# Patient Record
Sex: Female | Born: 1935 | Race: White | Hispanic: No | Smoking: Former smoker
Health system: Southern US, Community
[De-identification: ages and names within clinical notes are randomized; demographics above are authoritative.]

## PROBLEM LIST (undated history)

## (undated) DIAGNOSIS — E785 Hyperlipidemia, unspecified: Secondary | ICD-10-CM

## (undated) DIAGNOSIS — Z8619 Personal history of other infectious and parasitic diseases: Secondary | ICD-10-CM

## (undated) DIAGNOSIS — G40909 Epilepsy, unspecified, not intractable, without status epilepticus: Secondary | ICD-10-CM

## (undated) DIAGNOSIS — R011 Cardiac murmur, unspecified: Secondary | ICD-10-CM

## (undated) DIAGNOSIS — I509 Heart failure, unspecified: Secondary | ICD-10-CM

## (undated) DIAGNOSIS — Z9109 Other allergy status, other than to drugs and biological substances: Secondary | ICD-10-CM

## (undated) DIAGNOSIS — I499 Cardiac arrhythmia, unspecified: Secondary | ICD-10-CM

## (undated) DIAGNOSIS — I471 Supraventricular tachycardia: Secondary | ICD-10-CM

## (undated) DIAGNOSIS — I10 Essential (primary) hypertension: Secondary | ICD-10-CM

## (undated) DIAGNOSIS — I739 Peripheral vascular disease, unspecified: Secondary | ICD-10-CM

## (undated) DIAGNOSIS — G459 Transient cerebral ischemic attack, unspecified: Secondary | ICD-10-CM

## (undated) DIAGNOSIS — F0390 Unspecified dementia without behavioral disturbance: Secondary | ICD-10-CM

## (undated) DIAGNOSIS — H9190 Unspecified hearing loss, unspecified ear: Secondary | ICD-10-CM

## (undated) HISTORY — DX: Heart failure, unspecified: I50.9

## (undated) HISTORY — PX: EYE SURGERY: SHX253

## (undated) HISTORY — DX: Supraventricular tachycardia: I47.1

## (undated) HISTORY — DX: Transient cerebral ischemic attack, unspecified: G45.9

## (undated) HISTORY — PX: VAGINAL HYSTERECTOMY: SUR661

## (undated) HISTORY — PX: ABLATION OF DYSRHYTHMIC FOCUS: SHX254

## (undated) HISTORY — DX: Hyperlipidemia, unspecified: E78.5

## (undated) HISTORY — DX: Essential (primary) hypertension: I10

## (undated) HISTORY — PX: JOINT REPLACEMENT: SHX530

## (undated) HISTORY — PX: AUGMENTATION MAMMAPLASTY: SUR837

## (undated) HISTORY — PX: KNEE ARTHROSCOPY W/ MENISCAL REPAIR: SHX1877

## (undated) HISTORY — DX: Unspecified hearing loss, unspecified ear: H91.90

## (undated) HISTORY — DX: Epilepsy, unspecified, not intractable, without status epilepticus: G40.909

## (undated) HISTORY — PX: TONSILLECTOMY: SUR1361

## (undated) HISTORY — DX: Personal history of other infectious and parasitic diseases: Z86.19

## (undated) HISTORY — PX: CORONARY ANGIOPLASTY: SHX604

## (undated) HISTORY — DX: Cardiac arrhythmia, unspecified: I49.9

## (undated) HISTORY — DX: Unspecified dementia, unspecified severity, without behavioral disturbance, psychotic disturbance, mood disturbance, and anxiety: F03.90

## (undated) HISTORY — DX: Cardiac murmur, unspecified: R01.1

---

## 2000-08-07 LAB — HM COLONOSCOPY

## 2012-05-30 ENCOUNTER — Encounter: Payer: Self-pay | Admitting: *Deleted

## 2012-06-07 ENCOUNTER — Ambulatory Visit: Payer: Self-pay | Admitting: Sports Medicine

## 2012-06-14 ENCOUNTER — Ambulatory Visit (INDEPENDENT_AMBULATORY_CARE_PROVIDER_SITE_OTHER): Payer: Medicare PPO | Admitting: Cardiovascular Disease

## 2012-06-14 ENCOUNTER — Encounter: Payer: Self-pay | Admitting: Cardiovascular Disease

## 2012-06-14 VITALS — BP 180/90 | HR 78 | Ht 64.0 in | Wt 140.8 lb

## 2012-06-14 DIAGNOSIS — I10 Essential (primary) hypertension: Secondary | ICD-10-CM | POA: Insufficient documentation

## 2012-06-14 DIAGNOSIS — I498 Other specified cardiac arrhythmias: Secondary | ICD-10-CM

## 2012-06-14 DIAGNOSIS — E785 Hyperlipidemia, unspecified: Secondary | ICD-10-CM | POA: Insufficient documentation

## 2012-06-14 DIAGNOSIS — I4891 Unspecified atrial fibrillation: Secondary | ICD-10-CM | POA: Insufficient documentation

## 2012-06-14 DIAGNOSIS — I471 Supraventricular tachycardia: Secondary | ICD-10-CM

## 2012-06-14 NOTE — Patient Instructions (Addendum)
Labs today.  Continue same medications.  Follow up in 6 months.  

## 2012-06-14 NOTE — Progress Notes (Signed)
HPI  This is a 78 year old female who is here today to establish cardiovascular care. She moved last year from [place], [place] to be close to her family members. She is a retired Engineer, civil (consulting). 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 No recurrent episodes since then. She had cardiac catheterization in November of 2012 which showed no significant coronary artery disease. She also has history of hypertension with possible white coat syndrome. Atrial fibrillation is listed in her chart but she is certain that she never had A. fib before and was never on anticoagulation. She denies any chest pain, dyspnea, palpitations or dizziness. She saw Dr. Lady Gary for one visit but decided to switch.  Allergies  Allergen Reactions  . Penicillins   . Statins      Current Outpatient Prescriptions on File Prior to Visit  Medication Sig Dispense Refill  . aspirin 81 MG tablet Take 81 mg by mouth daily.      Marland Kitchen b complex vitamins tablet Take 1 tablet by mouth daily.      . Calcium Carb-Cholecalciferol (CALCIUM + D3) 600-200 MG-UNIT TABS Take by mouth 2 (two) times daily.      . carvedilol (COREG) 6.25 MG tablet Take 6.25 mg by mouth 2 (two) times daily with a meal.      . Coenzyme Q10 200 MG capsule Take 200 mg by mouth daily.      Marland Kitchen ezetimibe (ZETIA) 10 MG tablet Take 10 mg by mouth daily.      . hydrochlorothiazide (HYDRODIURIL) 25 MG tablet Take 12.5 mg by mouth daily.       . irbesartan (AVAPRO) 150 MG tablet Take 150 mg by mouth daily.      . Omega-3 Fatty Acids (ULTRA OMEGA-3 FISH OIL PO) Take 1,200 mg by mouth daily.          Past Medical History  Diagnosis Date  . Dyslipidemia   . HOH (hard of hearing)   . Hyperlipidemia   . Heart murmur   . H/O scarlet fever   . Hypertension   . 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 Harvie Bridge, [place].  . Ectopic  atrial tachycardia      Past Surgical History  Procedure Date  . Tonsillectomy   . Vaginal hysterectomy      History reviewed. No pertinent family history.   History   Social History  . Marital Status: Divorced    Spouse Name: N/A    Number of Children: N/A  . Years of Education: N/A   Occupational History  . Not on file.   Social History Main Topics  . Smoking status: Former Smoker -- 1.0 packs/day for 10 years    Types: Cigarettes  . Smokeless tobacco: Not on file  . Alcohol Use: No  . Drug Use: No  . Sexually Active:    Other Topics Concern  . Not on file   Social History Narrative  . No narrative on file     ROS Constitutional: Negative for fever, chills, diaphoresis, activity change, appetite change and fatigue.  HENT: Negative for hearing loss, nosebleeds, congestion, sore throat, facial swelling, drooling, trouble swallowing, neck pain, voice change, sinus pressure and tinnitus.  Eyes: Negative for photophobia, pain, discharge and visual disturbance.  Respiratory: Negative for apnea, cough, chest tightness, shortness of breath and wheezing.  Cardiovascular: Negative for chest pain, palpitations and leg  swelling.  Gastrointestinal: Negative for nausea, vomiting, abdominal pain, diarrhea, constipation, blood in stool and abdominal distention.  Genitourinary: Negative for dysuria, urgency, frequency, hematuria and decreased urine volume.  Musculoskeletal: Negative for myalgias, back pain, joint swelling, arthralgias and gait problem.  Skin: Negative for color change, pallor, rash and wound.  Neurological: Negative for dizziness, tremors, seizures, syncope, speech difficulty, weakness, light-headedness, numbness and headaches.  Psychiatric/Behavioral: Negative for suicidal ideas, hallucinations, behavioral problems and agitation. The patient is not nervous/anxious.     PHYSICAL EXAM   BP 180/90  Pulse 78  Ht 5\' 4"  (1.626 m)  Wt 140 lb 12 oz (63.844 kg)   BMI 24.16 kg/m2 Constitutional: She is oriented to person, place, and time. She appears well-developed and well-nourished. No distress.  HENT: No nasal discharge.  Head: Normocephalic and atraumatic.  Eyes: Pupils are equal and round. Right eye exhibits no discharge. Left eye exhibits no discharge.  Neck: Normal range of motion. Neck supple. No JVD present. No thyromegaly present.  Cardiovascular: Normal rate, regular rhythm, normal heart sounds. Exam reveals no gallop and no friction rub. No murmur heard.  Pulmonary/Chest: Effort normal and breath sounds normal. No stridor. No respiratory distress. She has no wheezes. She has no rales. She exhibits no tenderness.  Abdominal: Soft. Bowel sounds are normal. She exhibits no distension. There is no tenderness. There is no rebound and no guarding.  Musculoskeletal: Normal range of motion. She exhibits no edema and no tenderness.  Neurological: She is alert and oriented to person, place, and time. Coordination normal.  Skin: Skin is warm and dry. No rash noted. She is not diaphoretic. No erythema. No pallor.  Psychiatric: She has a normal mood and affect. Her behavior is normal. Judgment and thought content normal.     EKG: Sinus  Rhythm  WITHIN NORMAL LIMITS   ASSESSMENT AND PLAN

## 2012-06-14 NOTE — Assessment & Plan Note (Signed)
No recurrent arrhythmia since the ablation procedure in 2011. Continue treatment with Coreg at current dose. Followup in 6 months.

## 2012-06-14 NOTE — Assessment & Plan Note (Signed)
I will obtain a fasting lipid and liver profile. She also need to establish with a primary care physician.

## 2012-06-14 NOTE — Assessment & Plan Note (Signed)
Her blood pressure is elevated today but she reports normal blood pressure readings at home. She did not tolerate higher dose of hydrochlorothiazide due to dizziness. I asked her to monitor blood pressure and notify us if systolic is above 140.

## 2012-06-15 LAB — HEPATIC FUNCTION PANEL
ALT: 15 IU/L (ref 0–32)
Bilirubin, Direct: 0.2 mg/dL (ref 0.00–0.40)

## 2012-06-15 LAB — LIPID PANEL
Cholesterol, Total: 253 mg/dL — ABNORMAL HIGH (ref 100–199)
Triglycerides: 143 mg/dL (ref 0–149)
VLDL Cholesterol Cal: 29 mg/dL (ref 5–40)

## 2012-06-26 ENCOUNTER — Telehealth: Payer: Self-pay

## 2012-06-26 NOTE — Telephone Encounter (Signed)
Pt scheduled for knee surg 2/27 at [facility] Is she cleared for surg?

## 2012-06-28 NOTE — Telephone Encounter (Signed)
Yes, she is cleared.

## 2012-06-29 ENCOUNTER — Other Ambulatory Visit: Payer: Self-pay

## 2012-07-01 NOTE — Telephone Encounter (Signed)
Letter faxed to [phone]

## 2012-07-03 ENCOUNTER — Other Ambulatory Visit: Payer: Self-pay | Admitting: *Deleted

## 2012-07-03 MED ORDER — IRBESARTAN 150 MG PO TABS: 150.0000 mg | ORAL_TABLET | Freq: Every day | ORAL | Status: DC

## 2012-07-03 MED ORDER — EZETIMIBE 10 MG PO TABS: 10.0000 mg | ORAL_TABLET | Freq: Every day | ORAL | Status: DC

## 2012-07-03 NOTE — Telephone Encounter (Signed)
Needs 90 daily refill called in for both medications to [facility]

## 2012-07-18 DIAGNOSIS — M659 Unspecified synovitis and tenosynovitis, unspecified site: Secondary | ICD-10-CM | POA: Diagnosis not present

## 2012-07-18 DIAGNOSIS — M23359 Other meniscus derangements, posterior horn of lateral meniscus, unspecified knee: Secondary | ICD-10-CM | POA: Diagnosis not present

## 2012-07-18 DIAGNOSIS — R9431 Abnormal electrocardiogram [ECG] [EKG]: Secondary | ICD-10-CM | POA: Diagnosis not present

## 2012-07-18 DIAGNOSIS — R0789 Other chest pain: Secondary | ICD-10-CM | POA: Diagnosis not present

## 2012-07-18 DIAGNOSIS — R079 Chest pain, unspecified: Secondary | ICD-10-CM | POA: Diagnosis not present

## 2012-07-18 DIAGNOSIS — M23349 Other meniscus derangements, anterior horn of lateral meniscus, unspecified knee: Secondary | ICD-10-CM | POA: Diagnosis not present

## 2012-07-18 DIAGNOSIS — M9420 Chondromalacia, unspecified site: Secondary | ICD-10-CM | POA: Diagnosis not present

## 2012-07-18 DIAGNOSIS — S83289A Other tear of lateral meniscus, current injury, unspecified knee, initial encounter: Secondary | ICD-10-CM | POA: Diagnosis not present

## 2012-07-22 ENCOUNTER — Encounter: Payer: Self-pay | Admitting: Cardiovascular Disease

## 2012-07-22 ENCOUNTER — Ambulatory Visit (INDEPENDENT_AMBULATORY_CARE_PROVIDER_SITE_OTHER): Payer: Medicare Other | Admitting: Cardiovascular Disease

## 2012-07-22 VITALS — BP 160/70 | HR 76 | Ht 65.0 in | Wt 141.8 lb

## 2012-07-22 DIAGNOSIS — R079 Chest pain, unspecified: Secondary | ICD-10-CM | POA: Diagnosis not present

## 2012-07-22 DIAGNOSIS — I10 Essential (primary) hypertension: Secondary | ICD-10-CM

## 2012-07-22 DIAGNOSIS — E785 Hyperlipidemia, unspecified: Secondary | ICD-10-CM

## 2012-07-22 DIAGNOSIS — I498 Other specified cardiac arrhythmias: Secondary | ICD-10-CM

## 2012-07-22 DIAGNOSIS — I471 Supraventricular tachycardia: Secondary | ICD-10-CM

## 2012-07-22 MED ORDER — CARVEDILOL 12.5 MG PO TABS: 12.5000 mg | ORAL_TABLET | Freq: Two times a day (BID) | ORAL | Status: DC

## 2012-07-22 MED ORDER — ROSUVASTATIN CALCIUM 5 MG PO TABS: 5.0000 mg | ORAL_TABLET | Freq: Every day | ORAL | Status: DC

## 2012-07-22 NOTE — Progress Notes (Signed)
HPI  This is a 78 year old female who is here today for a followup visit regarding recent chest pain after knee surgery. 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 No recurrent episodes since then. She had cardiac catheterization in November of 2012 which showed no significant coronary artery disease. She also has history of hypertension with possible white coat syndrome. Atrial fibrillation is listed in her chart but she is certain that she never had A. fib before and was never on anticoagulation. She underwent right knee arthroscopic surgery last week in [place] without complications. In the recovery area, she started having substernal chest tightness which lasted for 30 minutes. It improved with nitroglycerin and fentanyl. She had an EKG done which was read as atrial fibrillation. However, looking closely at the EKG, P waves were evident. The rhythm was sinus with sinus arrhythmia. Heart rate was 66 beats per minute. No significant ST changes. She was sent to the emergency room at [facility]. Cardiac enzymes were negative. No chest discomfort since then. Her blood pressure is still running high.  Allergies  Allergen Reactions  . Penicillins   . Statins      Current Outpatient Prescriptions on File Prior to Visit  Medication Sig Dispense Refill  . aspirin 81 MG tablet Take 81 mg by mouth daily.      Marland Kitchen b complex vitamins tablet Take 1 tablet by mouth daily.      . Calcium Carb-Cholecalciferol (CALCIUM + D3) 600-200 MG-UNIT TABS Take by mouth 2 (two) times daily.      . Coenzyme Q10 200 MG capsule Take 200 mg by mouth daily.      Marland Kitchen ezetimibe (ZETIA) 10 MG tablet Take 1 tablet (10 mg total) by mouth daily.  90 tablet  3  . hydrochlorothiazide (HYDRODIURIL) 25 MG tablet Take 12.5 mg by mouth daily.       . irbesartan (AVAPRO) 150 MG tablet Take 1 tablet (150 mg total) by mouth daily.  90  tablet  3  . Omega-3 Fatty Acids (ULTRA OMEGA-3 FISH OIL PO) Take 1,200 mg by mouth daily.        No current facility-administered medications on file prior to visit.     Past Medical History  Diagnosis Date  . Dyslipidemia   . HOH (hard of hearing)   . Heart murmur   . H/O scarlet fever   . Hypertension   . 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 Harvie Bridge, [place].  . Ectopic atrial tachycardia   . Hyperlipidemia      Past Surgical History  Procedure Laterality Date  . Tonsillectomy    . Vaginal hysterectomy    . Knee arthroscopy w/ meniscal repair      right     History reviewed. No pertinent family history.   History   Social History  . Marital Status: Divorced    Spouse Name: N/A    Number of Children: N/A  . Years of Education: N/A   Occupational History  . Not on file.   Social History Main Topics  . Smoking status: Former Smoker -- 1.00 packs/day for 10 years    Types: Cigarettes  . Smokeless tobacco: Not on file  . Alcohol Use: No  . Drug Use: No  . Sexually Active: Not on file   Other Topics Concern  . Not  on file   Social History Narrative  . No narrative on file      PHYSICAL EXAM   BP 160/70  Pulse 76  Ht 5\' 5"  (1.651 m)  Wt 141 lb 12 oz (64.297 kg)  BMI 23.59 kg/m2 Constitutional: She is oriented to person, place, and time. She appears well-developed and well-nourished. No distress.  HENT: No nasal discharge.  Head: Normocephalic and atraumatic.  Eyes: Pupils are equal and round. Right eye exhibits no discharge. Left eye exhibits no discharge.  Neck: Normal range of motion. Neck supple. No JVD present. No thyromegaly present.  Cardiovascular: Normal rate, regular rhythm, normal heart sounds. Exam reveals no gallop and no friction rub. No murmur heard.  Pulmonary/Chest: Effort normal and breath sounds normal. No stridor. No respiratory distress. She has no wheezes. She  has no rales. She exhibits no tenderness.  Abdominal: Soft. Bowel sounds are normal. She exhibits no distension. There is no tenderness. There is no rebound and no guarding.  Musculoskeletal: Normal range of motion. She exhibits no edema and no tenderness.  Neurological: She is alert and oriented to person, place, and time. Coordination normal.  Skin: Skin is warm and dry. No rash noted. She is not diaphoretic. No erythema. No pallor.  Psychiatric: She has a normal mood and affect. Her behavior is normal. Judgment and thought content normal.     EKG: Sinus  Rhythm  WITHIN NORMAL LIMITS   ASSESSMENT AND PLAN

## 2012-07-22 NOTE — Patient Instructions (Addendum)
Increase Carvedilol to 12.5 mg twice daily.  Start Crestor 5 mg daily for high cholesterol. Follow up in 1 month.

## 2012-07-22 NOTE — Assessment & Plan Note (Signed)
She had one episode of his chest pain after recent knee surgery. No episodes since then. ECG does not show any acute ischemic changes. Cardiac enzymes were negative. She had cardiac catheterization in November of 2012 which showed no significant coronary artery disease. Based on all of that, the chance of underlying obstructive coronary artery disease is low. Continue to monitor for now. If she develops any recurrent chest pain, the plan is to proceed with a pharmacologic nuclear stress test.

## 2012-07-22 NOTE — Assessment & Plan Note (Signed)
Her blood pressure is still elevated. I will increase the dose of carvedilol to 12.5 mg twice daily.

## 2012-07-22 NOTE — Assessment & Plan Note (Signed)
Lab Results  Component Value Date   HDL 59 06/14/2012   LDLCALC 165* 06/14/2012   TRIG 143 06/14/2012   CHOLHDL 4.3 06/14/2012   She has tried only on statin in the past. I am concerned about the degree of elevation in LDL. Thus, I will start her on small dose rosuvastatin 5 mg once daily. She will need a followup lipid and liver profile in 6 weeks.

## 2012-07-22 NOTE — Assessment & Plan Note (Signed)
No recent arrhythmia. Recent ECG was read as atrial fibrillation. However, it was sinus rhythm with sinus arrhythmia.

## 2012-08-07 DIAGNOSIS — IMO0002 Reserved for concepts with insufficient information to code with codable children: Secondary | ICD-10-CM | POA: Diagnosis not present

## 2012-08-07 DIAGNOSIS — M25569 Pain in unspecified knee: Secondary | ICD-10-CM | POA: Diagnosis not present

## 2012-08-07 DIAGNOSIS — M6281 Muscle weakness (generalized): Secondary | ICD-10-CM | POA: Diagnosis not present

## 2012-08-07 DIAGNOSIS — M1710 Unilateral primary osteoarthritis, unspecified knee: Secondary | ICD-10-CM | POA: Diagnosis not present

## 2012-08-09 DIAGNOSIS — M1710 Unilateral primary osteoarthritis, unspecified knee: Secondary | ICD-10-CM | POA: Diagnosis not present

## 2012-08-09 DIAGNOSIS — IMO0002 Reserved for concepts with insufficient information to code with codable children: Secondary | ICD-10-CM | POA: Diagnosis not present

## 2012-08-09 DIAGNOSIS — M6281 Muscle weakness (generalized): Secondary | ICD-10-CM | POA: Diagnosis not present

## 2012-08-09 DIAGNOSIS — M25569 Pain in unspecified knee: Secondary | ICD-10-CM | POA: Diagnosis not present

## 2012-08-09 DIAGNOSIS — M25669 Stiffness of unspecified knee, not elsewhere classified: Secondary | ICD-10-CM | POA: Diagnosis not present

## 2012-08-12 DIAGNOSIS — M6281 Muscle weakness (generalized): Secondary | ICD-10-CM | POA: Diagnosis not present

## 2012-08-12 DIAGNOSIS — M25669 Stiffness of unspecified knee, not elsewhere classified: Secondary | ICD-10-CM | POA: Diagnosis not present

## 2012-08-12 DIAGNOSIS — M25569 Pain in unspecified knee: Secondary | ICD-10-CM | POA: Diagnosis not present

## 2012-08-12 DIAGNOSIS — IMO0002 Reserved for concepts with insufficient information to code with codable children: Secondary | ICD-10-CM | POA: Diagnosis not present

## 2012-08-12 DIAGNOSIS — M1710 Unilateral primary osteoarthritis, unspecified knee: Secondary | ICD-10-CM | POA: Diagnosis not present

## 2012-08-16 DIAGNOSIS — M6281 Muscle weakness (generalized): Secondary | ICD-10-CM | POA: Diagnosis not present

## 2012-08-16 DIAGNOSIS — M25569 Pain in unspecified knee: Secondary | ICD-10-CM | POA: Diagnosis not present

## 2012-08-16 DIAGNOSIS — M25669 Stiffness of unspecified knee, not elsewhere classified: Secondary | ICD-10-CM | POA: Diagnosis not present

## 2012-08-16 DIAGNOSIS — M1710 Unilateral primary osteoarthritis, unspecified knee: Secondary | ICD-10-CM | POA: Diagnosis not present

## 2012-08-16 DIAGNOSIS — IMO0002 Reserved for concepts with insufficient information to code with codable children: Secondary | ICD-10-CM | POA: Diagnosis not present

## 2012-08-20 DIAGNOSIS — M25569 Pain in unspecified knee: Secondary | ICD-10-CM | POA: Diagnosis not present

## 2012-08-20 DIAGNOSIS — M1710 Unilateral primary osteoarthritis, unspecified knee: Secondary | ICD-10-CM | POA: Diagnosis not present

## 2012-08-20 DIAGNOSIS — IMO0002 Reserved for concepts with insufficient information to code with codable children: Secondary | ICD-10-CM | POA: Diagnosis not present

## 2012-08-20 DIAGNOSIS — M25669 Stiffness of unspecified knee, not elsewhere classified: Secondary | ICD-10-CM | POA: Diagnosis not present

## 2012-08-20 DIAGNOSIS — M6281 Muscle weakness (generalized): Secondary | ICD-10-CM | POA: Diagnosis not present

## 2012-08-23 ENCOUNTER — Ambulatory Visit: Payer: Medicare Other | Admitting: Cardiovascular Disease

## 2012-08-23 DIAGNOSIS — M25569 Pain in unspecified knee: Secondary | ICD-10-CM | POA: Diagnosis not present

## 2012-08-23 DIAGNOSIS — IMO0002 Reserved for concepts with insufficient information to code with codable children: Secondary | ICD-10-CM | POA: Diagnosis not present

## 2012-08-23 DIAGNOSIS — M1710 Unilateral primary osteoarthritis, unspecified knee: Secondary | ICD-10-CM | POA: Diagnosis not present

## 2012-08-23 DIAGNOSIS — M6281 Muscle weakness (generalized): Secondary | ICD-10-CM | POA: Diagnosis not present

## 2012-08-23 DIAGNOSIS — M25669 Stiffness of unspecified knee, not elsewhere classified: Secondary | ICD-10-CM | POA: Diagnosis not present

## 2012-09-12 ENCOUNTER — Ambulatory Visit (INDEPENDENT_AMBULATORY_CARE_PROVIDER_SITE_OTHER): Payer: Medicare Other | Admitting: Cardiovascular Disease

## 2012-09-12 ENCOUNTER — Encounter: Payer: Self-pay | Admitting: Cardiovascular Disease

## 2012-09-12 VITALS — BP 171/69 | HR 83 | Ht 65.0 in | Wt 140.8 lb

## 2012-09-12 DIAGNOSIS — E785 Hyperlipidemia, unspecified: Secondary | ICD-10-CM | POA: Diagnosis not present

## 2012-09-12 DIAGNOSIS — I498 Other specified cardiac arrhythmias: Secondary | ICD-10-CM | POA: Diagnosis not present

## 2012-09-12 DIAGNOSIS — I10 Essential (primary) hypertension: Secondary | ICD-10-CM

## 2012-09-12 DIAGNOSIS — I471 Supraventricular tachycardia: Secondary | ICD-10-CM

## 2012-09-12 MED ORDER — CARVEDILOL 25 MG PO TABS: 25.0000 mg | ORAL_TABLET | Freq: Two times a day (BID) | ORAL | Status: DC

## 2012-09-12 NOTE — Patient Instructions (Addendum)
Labs today.  Increase Carvedilol  To 25 mg twice daily.  Follow up in 2 months.

## 2012-09-12 NOTE — Assessment & Plan Note (Signed)
Her blood pressure continues to be elevated. I will go ahead and increase the dose of carvedilol to 25 mg twice daily. If her blood pressure remains elevated in spite of maximizing 3 different antihypertensive medications, I will consider screening for secondary hypertension.

## 2012-09-12 NOTE — Progress Notes (Signed)
HPI  This is a 78 year old female who is here today for a followup visit . 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 No recurrent episodes since then. She had cardiac catheterization in November of 2012 which showed no significant coronary artery disease. She also has history of hypertension with possible white coat syndrome. Atrial fibrillation is listed in her chart but she is certain that she never had A. fib before and was never on anticoagulation. During last visit, I increased the dose of carvedilol 12.5 mg twice daily. I also had a small dose Crestor 5 mg daily for severe hyperlipidemia. She has been tolerating this medication so far. She is doing well and denies any chest pain, dyspnea or palpitations.  Allergies  Allergen Reactions  . Penicillins   . Statins      Current Outpatient Prescriptions on File Prior to Visit  Medication Sig Dispense Refill  . aspirin 81 MG tablet Take 81 mg by mouth daily.      Marland Kitchen b complex vitamins tablet Take 1 tablet by mouth daily.      . Calcium Carb-Cholecalciferol (CALCIUM + D3) 600-200 MG-UNIT TABS Take by mouth 2 (two) times daily.      . carvedilol (COREG) 12.5 MG tablet Take 1 tablet (12.5 mg total) by mouth 2 (two) times daily.  60 tablet  6  . Coenzyme Q10 200 MG capsule Take 200 mg by mouth daily.      Marland Kitchen ezetimibe (ZETIA) 10 MG tablet Take 1 tablet (10 mg total) by mouth daily.  90 tablet  3  . hydrochlorothiazide (HYDRODIURIL) 25 MG tablet Take 12.5 mg by mouth daily.       . irbesartan (AVAPRO) 150 MG tablet Take 1 tablet (150 mg total) by mouth daily.  90 tablet  3  . naproxen (NAPROSYN) 250 MG tablet Take 250 mg by mouth 2 (two) times daily with a meal.      . Omega-3 Fatty Acids (ULTRA OMEGA-3 FISH OIL PO) Take 1,200 mg by mouth daily.       . rosuvastatin (CRESTOR) 5 MG tablet Take 1 tablet (5 mg total) by mouth daily.  30 tablet  6   No current  facility-administered medications on file prior to visit.     Past Medical History  Diagnosis Date  . Dyslipidemia   . HOH (hard of hearing)   . Heart murmur   . H/O scarlet fever   . Hypertension   . 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 Harvie Bridge, [place].  . Ectopic atrial tachycardia   . Hyperlipidemia      Past Surgical History  Procedure Laterality Date  . Tonsillectomy    . Vaginal hysterectomy    . Knee arthroscopy w/ meniscal repair      right     History reviewed. No pertinent family history.   History   Social History  . Marital Status: Divorced    Spouse Name: N/A    Number of Children: N/A  . Years of Education: N/A   Occupational History  . Not on file.   Social History Main Topics  . Smoking status: Former Smoker -- 1.00 packs/day for 10 years    Types: Cigarettes  . Smokeless tobacco: Not on file  . Alcohol Use: No  . Drug Use: No  . Sexually Active: Not on file   Other  Topics Concern  . Not on file   Social History Narrative  . No narrative on file      PHYSICAL EXAM   BP 171/69  Pulse 83  Ht 5\' 5"  (1.651 m)  Wt 140 lb 12 oz (63.844 kg)  BMI 23.42 kg/m2 Constitutional: She is oriented to person, place, and time. She appears well-developed and well-nourished. No distress.  HENT: No nasal discharge.  Head: Normocephalic and atraumatic.  Eyes: Pupils are equal and round. Right eye exhibits no discharge. Left eye exhibits no discharge.  Neck: Normal range of motion. Neck supple. No JVD present. No thyromegaly present.  Cardiovascular: Normal rate, regular rhythm, normal heart sounds. Exam reveals no gallop and no friction rub. No murmur heard.  Pulmonary/Chest: Effort normal and breath sounds normal. No stridor. No respiratory distress. She has no wheezes. She has no rales. She exhibits no tenderness.  Abdominal: Soft. Bowel sounds are normal. She exhibits no  distension. There is no tenderness. There is no rebound and no guarding.  Musculoskeletal: Normal range of motion. She exhibits no edema and no tenderness.  Neurological: She is alert and oriented to person, place, and time. Coordination normal.  Skin: Skin is warm and dry. No rash noted. She is not diaphoretic. No erythema. No pallor.  Psychiatric: She has a normal mood and affect. Her behavior is normal. Judgment and thought content normal.     EKG: Normal sinus rhythm with PVCs   ASSESSMENT AND PLAN

## 2012-09-12 NOTE — Assessment & Plan Note (Signed)
She was started on small dose Crestor which seems to be well tolerated so far. I will check lipid and liver profile today and consider increasing the dose if needed.

## 2012-09-12 NOTE — Assessment & Plan Note (Signed)
No recurrent arrhythmia. She does have PVCs on her EKG but is overall asymptomatic

## 2012-09-13 LAB — LIPID PANEL
Chol/HDL Ratio: 3.8 ratio units (ref 0.0–4.4)
VLDL Cholesterol Cal: 50 mg/dL — ABNORMAL HIGH (ref 5–40)

## 2012-09-13 LAB — HEPATIC FUNCTION PANEL
Albumin: 4.4 g/dL (ref 3.5–4.8)
Alkaline Phosphatase: 71 IU/L (ref 39–117)
Total Protein: 6.4 g/dL (ref 6.0–8.5)

## 2012-09-13 NOTE — Progress Notes (Signed)
lmtcb

## 2012-09-16 NOTE — Progress Notes (Signed)
Pt informed of lab results. 

## 2012-09-16 NOTE — Progress Notes (Signed)
lmtcb x2 

## 2012-09-23 DIAGNOSIS — H25019 Cortical age-related cataract, unspecified eye: Secondary | ICD-10-CM | POA: Diagnosis not present

## 2012-09-26 ENCOUNTER — Other Ambulatory Visit: Payer: Self-pay

## 2012-09-26 MED ORDER — IRBESARTAN 150 MG PO TABS: 150.0000 mg | ORAL_TABLET | Freq: Every day | ORAL | Status: DC

## 2012-09-26 NOTE — Telephone Encounter (Signed)
Refilled Avapro sent to [facility].

## 2012-09-30 DIAGNOSIS — M1710 Unilateral primary osteoarthritis, unspecified knee: Secondary | ICD-10-CM | POA: Diagnosis not present

## 2012-09-30 DIAGNOSIS — IMO0002 Reserved for concepts with insufficient information to code with codable children: Secondary | ICD-10-CM | POA: Diagnosis not present

## 2012-10-23 DIAGNOSIS — M1990 Unspecified osteoarthritis, unspecified site: Secondary | ICD-10-CM | POA: Insufficient documentation

## 2012-11-06 DIAGNOSIS — M25559 Pain in unspecified hip: Secondary | ICD-10-CM | POA: Diagnosis not present

## 2012-11-06 DIAGNOSIS — M25659 Stiffness of unspecified hip, not elsewhere classified: Secondary | ICD-10-CM | POA: Diagnosis not present

## 2012-11-06 DIAGNOSIS — M6281 Muscle weakness (generalized): Secondary | ICD-10-CM | POA: Diagnosis not present

## 2012-11-06 DIAGNOSIS — IMO0002 Reserved for concepts with insufficient information to code with codable children: Secondary | ICD-10-CM | POA: Diagnosis not present

## 2012-11-06 DIAGNOSIS — M1710 Unilateral primary osteoarthritis, unspecified knee: Secondary | ICD-10-CM | POA: Diagnosis not present

## 2012-11-08 DIAGNOSIS — M6281 Muscle weakness (generalized): Secondary | ICD-10-CM | POA: Diagnosis not present

## 2012-11-08 DIAGNOSIS — IMO0002 Reserved for concepts with insufficient information to code with codable children: Secondary | ICD-10-CM | POA: Diagnosis not present

## 2012-11-08 DIAGNOSIS — M1710 Unilateral primary osteoarthritis, unspecified knee: Secondary | ICD-10-CM | POA: Diagnosis not present

## 2012-11-08 DIAGNOSIS — M25569 Pain in unspecified knee: Secondary | ICD-10-CM | POA: Diagnosis not present

## 2012-11-12 ENCOUNTER — Encounter: Payer: Self-pay | Admitting: Cardiovascular Disease

## 2012-11-12 ENCOUNTER — Ambulatory Visit (INDEPENDENT_AMBULATORY_CARE_PROVIDER_SITE_OTHER): Payer: Medicare Other | Admitting: Cardiovascular Disease

## 2012-11-12 VITALS — BP 138/92 | HR 73 | Ht 65.0 in | Wt 137.2 lb

## 2012-11-12 DIAGNOSIS — I498 Other specified cardiac arrhythmias: Secondary | ICD-10-CM

## 2012-11-12 DIAGNOSIS — I499 Cardiac arrhythmia, unspecified: Secondary | ICD-10-CM

## 2012-11-12 DIAGNOSIS — E785 Hyperlipidemia, unspecified: Secondary | ICD-10-CM

## 2012-11-12 DIAGNOSIS — M25569 Pain in unspecified knee: Secondary | ICD-10-CM | POA: Diagnosis not present

## 2012-11-12 DIAGNOSIS — IMO0002 Reserved for concepts with insufficient information to code with codable children: Secondary | ICD-10-CM | POA: Diagnosis not present

## 2012-11-12 DIAGNOSIS — M25669 Stiffness of unspecified knee, not elsewhere classified: Secondary | ICD-10-CM | POA: Diagnosis not present

## 2012-11-12 DIAGNOSIS — I10 Essential (primary) hypertension: Secondary | ICD-10-CM

## 2012-11-12 DIAGNOSIS — R0789 Other chest pain: Secondary | ICD-10-CM | POA: Diagnosis not present

## 2012-11-12 DIAGNOSIS — M1710 Unilateral primary osteoarthritis, unspecified knee: Secondary | ICD-10-CM | POA: Diagnosis not present

## 2012-11-12 DIAGNOSIS — I471 Supraventricular tachycardia: Secondary | ICD-10-CM

## 2012-11-12 DIAGNOSIS — M6281 Muscle weakness (generalized): Secondary | ICD-10-CM | POA: Diagnosis not present

## 2012-11-12 MED ORDER — LOSARTAN POTASSIUM 100 MG PO TABS: 100.0000 mg | ORAL_TABLET | Freq: Every day | ORAL | Status: DC

## 2012-11-12 NOTE — Assessment & Plan Note (Signed)
She did not tolerate higher dose of carvedilol due to dizziness. Blood pressure is reasonably controlled but due to prescription coverage she wants Avapro to be changed. I will switch her to losartan 100 mg once daily. This can be combined with hydrochlorothiazide in the future.

## 2012-11-12 NOTE — Progress Notes (Signed)
HPI  This is a 78 year old female who is here today for a followup visit . 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 No recurrent episodes since then. She had cardiac catheterization in November of 2012 which showed no significant coronary artery disease. She also has history of hypertension with possible white coat syndrome. Atrial fibrillation is listed in her chart but she is certain that she never had A. fib before and was never on anticoagulation. During last visit, I increased the dose of carvedilol to 25 mg.  she did not tolerate that dose due to dizziness and went back on 12.5 mg twice daily.   she has been tolerating small dose Crestor 5 mg daily for severe hyperlipidemia.  She has been doing well and denies any chest pain, dyspnea or palpitations.  Allergies  Allergen Reactions  . Penicillins   . Statins      Current Outpatient Prescriptions on File Prior to Visit  Medication Sig Dispense Refill  . aspirin 81 MG tablet Take 81 mg by mouth daily.      Marland Kitchen b complex vitamins tablet Take 1 tablet by mouth daily.      . Calcium Carb-Cholecalciferol (CALCIUM + D3) 600-200 MG-UNIT TABS Take by mouth 2 (two) times daily.      . Coenzyme Q10 200 MG capsule Take 200 mg by mouth daily.      Marland Kitchen ezetimibe (ZETIA) 10 MG tablet Take 1 tablet (10 mg total) by mouth daily.  90 tablet  3  . hydrochlorothiazide (HYDRODIURIL) 25 MG tablet Take 12.5 mg by mouth daily.       . irbesartan (AVAPRO) 150 MG tablet Take 1 tablet (150 mg total) by mouth daily.  90 tablet  3  . naproxen (NAPROSYN) 250 MG tablet Take 250 mg by mouth as needed.       . Omega-3 Fatty Acids (ULTRA OMEGA-3 FISH OIL PO) Take 1,200 mg by mouth daily.       . rosuvastatin (CRESTOR) 5 MG tablet Take 1 tablet (5 mg total) by mouth daily.  30 tablet  6   No current facility-administered medications on file prior to visit.     Past Medical History    Diagnosis Date  . Dyslipidemia   . HOH (hard of hearing)   . Heart murmur   . H/O scarlet fever   . Hypertension   . 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 Harvie Bridge, [place].  . Ectopic atrial tachycardia   . Hyperlipidemia      Past Surgical History  Procedure Laterality Date  . Tonsillectomy    . Vaginal hysterectomy    . Knee arthroscopy w/ meniscal repair      right     History reviewed. No pertinent family history.   History   Social History  . Marital Status: Divorced    Spouse Name: N/A    Number of Children: N/A  . Years of Education: N/A   Occupational History  . Not on file.   Social History Main Topics  . Smoking status: Former Smoker -- 1.00 packs/day for 10 years    Types: Cigarettes  . Smokeless tobacco: Not on file  . Alcohol Use: No  . Drug Use: No  . Sexually Active: Not on file   Other Topics Concern  . Not on file   Social History Narrative  .  No narrative on file      PHYSICAL EXAM   BP 138/92  Pulse 73  Ht 5\' 5"  (1.651 m)  Wt 137 lb 4 oz (62.256 kg)  BMI 22.84 kg/m2 Constitutional: She is oriented to person, place, and time. She appears well-developed and well-nourished. No distress.  HENT: No nasal discharge.  Head: Normocephalic and atraumatic.  Eyes: Pupils are equal and round. Right eye exhibits no discharge. Left eye exhibits no discharge.  Neck: Normal range of motion. Neck supple. No JVD present. No thyromegaly present.  Cardiovascular: Normal rate, regular rhythm, normal heart sounds. Exam reveals no gallop and no friction rub. No murmur heard.  Pulmonary/Chest: Effort normal and breath sounds normal. No stridor. No respiratory distress. She has no wheezes. She has no rales. She exhibits no tenderness.  Abdominal: Soft. Bowel sounds are normal. She exhibits no distension. There is no tenderness. There is no rebound and no guarding.  Musculoskeletal: Normal  range of motion. She exhibits no edema and no tenderness.  Neurological: She is alert and oriented to person, place, and time. Coordination normal.  Skin: Skin is warm and dry. No rash noted. She is not diaphoretic. No erythema. No pallor.  Psychiatric: She has a normal mood and affect. Her behavior is normal. Judgment and thought content normal.     EKG: Normal sinus rhythm   ASSESSMENT AND PLAN

## 2012-11-12 NOTE — Patient Instructions (Addendum)
Stop Zetia Stop Avapro.  Start Losartan 100 mg once daily   Follow up in 6 months.

## 2012-11-12 NOTE — Assessment & Plan Note (Signed)
No the current tachycardia. Continue current dose of carvedilol.

## 2012-11-12 NOTE — Assessment & Plan Note (Signed)
Lab Results  Component Value Date   HDL 59 09/12/2012   LDLCALC 161* 09/12/2012   TRIG 252* 09/12/2012   CHOLHDL 3.8 09/12/2012   Significant improvement in lipid profile on small dose Crestor. I will stop Zetia. Will recheck lipid and liver profile in about 6 months.

## 2012-11-14 DIAGNOSIS — M6281 Muscle weakness (generalized): Secondary | ICD-10-CM | POA: Diagnosis not present

## 2012-11-14 DIAGNOSIS — M25569 Pain in unspecified knee: Secondary | ICD-10-CM | POA: Diagnosis not present

## 2012-11-19 DIAGNOSIS — M25569 Pain in unspecified knee: Secondary | ICD-10-CM | POA: Diagnosis not present

## 2012-11-19 DIAGNOSIS — M25669 Stiffness of unspecified knee, not elsewhere classified: Secondary | ICD-10-CM | POA: Diagnosis not present

## 2012-11-19 DIAGNOSIS — M6281 Muscle weakness (generalized): Secondary | ICD-10-CM | POA: Diagnosis not present

## 2012-11-19 DIAGNOSIS — IMO0002 Reserved for concepts with insufficient information to code with codable children: Secondary | ICD-10-CM | POA: Diagnosis not present

## 2012-11-19 DIAGNOSIS — M1710 Unilateral primary osteoarthritis, unspecified knee: Secondary | ICD-10-CM | POA: Diagnosis not present

## 2012-11-26 DIAGNOSIS — M25669 Stiffness of unspecified knee, not elsewhere classified: Secondary | ICD-10-CM | POA: Diagnosis not present

## 2012-11-26 DIAGNOSIS — M6281 Muscle weakness (generalized): Secondary | ICD-10-CM | POA: Diagnosis not present

## 2012-11-26 DIAGNOSIS — M1710 Unilateral primary osteoarthritis, unspecified knee: Secondary | ICD-10-CM | POA: Diagnosis not present

## 2012-11-26 DIAGNOSIS — IMO0002 Reserved for concepts with insufficient information to code with codable children: Secondary | ICD-10-CM | POA: Diagnosis not present

## 2012-11-26 DIAGNOSIS — M25569 Pain in unspecified knee: Secondary | ICD-10-CM | POA: Diagnosis not present

## 2012-11-28 DIAGNOSIS — M25569 Pain in unspecified knee: Secondary | ICD-10-CM | POA: Diagnosis not present

## 2012-11-28 DIAGNOSIS — M25669 Stiffness of unspecified knee, not elsewhere classified: Secondary | ICD-10-CM | POA: Diagnosis not present

## 2012-11-28 DIAGNOSIS — M1710 Unilateral primary osteoarthritis, unspecified knee: Secondary | ICD-10-CM | POA: Diagnosis not present

## 2012-11-28 DIAGNOSIS — M6281 Muscle weakness (generalized): Secondary | ICD-10-CM | POA: Diagnosis not present

## 2012-11-28 DIAGNOSIS — IMO0002 Reserved for concepts with insufficient information to code with codable children: Secondary | ICD-10-CM | POA: Diagnosis not present

## 2012-12-10 DIAGNOSIS — M25669 Stiffness of unspecified knee, not elsewhere classified: Secondary | ICD-10-CM | POA: Diagnosis not present

## 2012-12-10 DIAGNOSIS — IMO0002 Reserved for concepts with insufficient information to code with codable children: Secondary | ICD-10-CM | POA: Diagnosis not present

## 2012-12-10 DIAGNOSIS — M1710 Unilateral primary osteoarthritis, unspecified knee: Secondary | ICD-10-CM | POA: Diagnosis not present

## 2012-12-10 DIAGNOSIS — M6281 Muscle weakness (generalized): Secondary | ICD-10-CM | POA: Diagnosis not present

## 2012-12-10 DIAGNOSIS — M25569 Pain in unspecified knee: Secondary | ICD-10-CM | POA: Diagnosis not present

## 2012-12-18 ENCOUNTER — Other Ambulatory Visit: Payer: Self-pay

## 2012-12-25 ENCOUNTER — Other Ambulatory Visit: Payer: Self-pay | Admitting: *Deleted

## 2012-12-25 MED ORDER — HYDROCHLOROTHIAZIDE 25 MG PO TABS: 12.5000 mg | ORAL_TABLET | Freq: Every day | ORAL | Status: DC

## 2013-01-27 DIAGNOSIS — M1990 Unspecified osteoarthritis, unspecified site: Secondary | ICD-10-CM | POA: Diagnosis not present

## 2013-01-27 DIAGNOSIS — M25569 Pain in unspecified knee: Secondary | ICD-10-CM | POA: Diagnosis not present

## 2013-02-21 DIAGNOSIS — Z23 Encounter for immunization: Secondary | ICD-10-CM | POA: Diagnosis not present

## 2013-03-20 ENCOUNTER — Other Ambulatory Visit: Payer: Self-pay

## 2013-04-14 DIAGNOSIS — M1710 Unilateral primary osteoarthritis, unspecified knee: Secondary | ICD-10-CM | POA: Diagnosis not present

## 2013-04-21 ENCOUNTER — Encounter: Payer: Self-pay | Admitting: Cardiovascular Disease

## 2013-04-21 ENCOUNTER — Ambulatory Visit (INDEPENDENT_AMBULATORY_CARE_PROVIDER_SITE_OTHER): Payer: Medicare Other | Admitting: Cardiovascular Disease

## 2013-04-21 VITALS — BP 193/102 | HR 70 | Ht 65.0 in | Wt 145.8 lb

## 2013-04-21 DIAGNOSIS — I10 Essential (primary) hypertension: Secondary | ICD-10-CM

## 2013-04-21 DIAGNOSIS — Z01810 Encounter for preprocedural cardiovascular examination: Secondary | ICD-10-CM | POA: Diagnosis not present

## 2013-04-21 DIAGNOSIS — I471 Supraventricular tachycardia, unspecified: Secondary | ICD-10-CM

## 2013-04-21 DIAGNOSIS — I498 Other specified cardiac arrhythmias: Secondary | ICD-10-CM

## 2013-04-21 MED ORDER — IRBESARTAN 150 MG PO TABS: 150.0000 mg | ORAL_TABLET | Freq: Every day | ORAL | Status: DC

## 2013-04-21 MED ORDER — HYDROCHLOROTHIAZIDE 25 MG PO TABS: 25.0000 mg | ORAL_TABLET | Freq: Every day | ORAL | Status: DC

## 2013-04-21 NOTE — Assessment & Plan Note (Signed)
She reports that her blood pressure control when she was on Avapro. I will switch her from losartan to Avapro and increase the dose of hydrochlorothiazide to 25 mg once daily.  Check basic metabolic profile and blood pressure in one week. We can consider adding amlodipine if needed also. However, she tends to have high blood pressure readings in the office. She is going to start monitoring her blood pressure at home again.

## 2013-04-21 NOTE — Assessment & Plan Note (Signed)
Overall, she is at low risk for cardiovascular complications given the lack of anginal symptoms, good functional capacity and no previous history of obstructive coronary artery disease. No further cardiac testing is indicated. I think we need to control her blood pressure about before the surgery.

## 2013-04-21 NOTE — Assessment & Plan Note (Signed)
No recurrent arrhythmia. Continue treatment with carvedilol.

## 2013-04-21 NOTE — Patient Instructions (Addendum)
Make the following changes to your medications: 1. Stop Losartan.  2. Start Avapro 150 mg once daily.  3. Increase Hydrochlorothiazide to 25 mg once daily.   Labs and nurse visit (for BP check) in 1 week.    Your physician wants you to follow-up in: 6 months.  You will receive a reminder letter in the mail two months in advance. If you don't receive a letter, please call our office to schedule the follow-up appointment.

## 2013-04-21 NOTE — Progress Notes (Signed)
HPI  This is a 78 year old female who is here today for a followup visit preoperative cardiovascular evaluation for knee surgery . 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 No recurrent episodes since then. She had cardiac catheterization in November of 2012 which showed no significant coronary artery disease. She also has history of hypertension with possible white coat syndrome.  she has been tolerating small dose Crestor 5 mg daily for severe hyperlipidemia.  She has been doing well and denies any chest pain, dyspnea or palpitations. She was switched from Avapro to losartan during last visit. Blood pressure has been running high lately. She denies symptoms.  Allergies  Allergen Reactions  . Penicillins   . Statins      Current Outpatient Prescriptions on File Prior to Visit  Medication Sig Dispense Refill  . aspirin 81 MG tablet Take 81 mg by mouth daily.      Marland Kitchen b complex vitamins tablet Take 1 tablet by mouth daily.      . Calcium Carb-Cholecalciferol (CALCIUM + D3) 600-200 MG-UNIT TABS Take by mouth 2 (two) times daily.      . carvedilol (COREG) 25 MG tablet Take 12.5 mg by mouth 2 (two) times daily.      . Coenzyme Q10 200 MG capsule Take 200 mg by mouth daily.      . hydrochlorothiazide (HYDRODIURIL) 25 MG tablet Take 0.5 tablets (12.5 mg total) by mouth daily.  90 tablet  3  . losartan (COZAAR) 100 MG tablet Take 1 tablet (100 mg total) by mouth daily.  30 tablet  6  . naproxen (NAPROSYN) 250 MG tablet Take 250 mg by mouth as needed.       . Omega-3 Fatty Acids (ULTRA OMEGA-3 FISH OIL PO) Take 1,200 mg by mouth daily.       . rosuvastatin (CRESTOR) 5 MG tablet Take 1 tablet (5 mg total) by mouth daily.  30 tablet  6   No current facility-administered medications on file prior to visit.     Past Medical History  Diagnosis Date  . Dyslipidemia   . HOH (hard of hearing)   . Heart murmur     . H/O scarlet fever   . Hypertension   . 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 Harvie Bridge, [place].  . Ectopic atrial tachycardia   . Hyperlipidemia      Past Surgical History  Procedure Laterality Date  . Tonsillectomy    . Vaginal hysterectomy    . Knee arthroscopy w/ meniscal repair      right     Family History  Problem Relation Age of Onset  . Family history unknown: Yes     History   Social History  . Marital Status: Divorced    Spouse Name: N/A    Number of Children: N/A  . Years of Education: N/A   Occupational History  . Not on file.   Social History Main Topics  . Smoking status: Former Smoker -- 1.00 packs/day for 10 years    Types: Cigarettes  . Smokeless tobacco: Not on file  . Alcohol Use: No  . Drug Use: No  . Sexual Activity: Not on file   Other Topics Concern  . Not on file   Social History Narrative  . No narrative on file      PHYSICAL EXAM   BP 193/102  Pulse 70  Ht 5\' 5"  (1.651 m)  Wt 145 lb 12 oz (66.112 kg)  BMI 24.25 kg/m2 Constitutional: She is oriented to person, place, and time. She appears well-developed and well-nourished. No distress.  HENT: No nasal discharge.  Head: Normocephalic and atraumatic.  Eyes: Pupils are equal and round. Right eye exhibits no discharge. Left eye exhibits no discharge.  Neck: Normal range of motion. Neck supple. No JVD present. No thyromegaly present.  Cardiovascular: Normal rate, regular rhythm, normal heart sounds. Exam reveals no gallop and no friction rub. No murmur heard.  Pulmonary/Chest: Effort normal and breath sounds normal. No stridor. No respiratory distress. She has no wheezes. She has no rales. She exhibits no tenderness.  Abdominal: Soft. Bowel sounds are normal. She exhibits no distension. There is no tenderness. There is no rebound and no guarding.  Musculoskeletal: Normal range of motion. She exhibits no edema and  no tenderness.  Neurological: She is alert and oriented to person, place, and time. Coordination normal.  Skin: Skin is warm and dry. No rash noted. She is not diaphoretic. No erythema. No pallor.  Psychiatric: She has a normal mood and affect. Her behavior is normal. Judgment and thought content normal.     EKG: Normal sinus rhythm with a PVC  ASSESSMENT AND PLAN

## 2013-04-22 ENCOUNTER — Other Ambulatory Visit: Payer: Medicare Other

## 2013-04-29 ENCOUNTER — Ambulatory Visit (INDEPENDENT_AMBULATORY_CARE_PROVIDER_SITE_OTHER): Payer: Medicare Other | Admitting: *Deleted

## 2013-04-29 VITALS — BP 145/76 | HR 81 | Ht 65.0 in | Wt 145.0 lb

## 2013-04-29 DIAGNOSIS — I10 Essential (primary) hypertension: Secondary | ICD-10-CM

## 2013-04-29 NOTE — Progress Notes (Signed)
Checked blood pressure, told patient to continue current meds.  Reviewed blood pressure log.  Bmet today

## 2013-04-30 LAB — BASIC METABOLIC PANEL
BUN: 22 mg/dL (ref 8–27)
CO2: 26 mmol/L (ref 18–29)
Calcium: 9.9 mg/dL (ref 8.6–10.2)
Creatinine, Ser: 0.73 mg/dL (ref 0.57–1.00)
GFR calc non Af Amer: 80 mL/min/{1.73_m2} (ref >59)
Glucose: 126 mg/dL — ABNORMAL HIGH (ref 65–99)
Sodium: 142 mmol/L (ref 134–144)

## 2013-05-01 ENCOUNTER — Telehealth: Payer: Self-pay

## 2013-05-01 NOTE — Telephone Encounter (Signed)
Pt called and would like to know if she is cleared for surgery, also pt asks " what should my BP be for surgery?" Please call.

## 2013-05-01 NOTE — Telephone Encounter (Signed)
Informed patient that Dr. Kirke Corin has cleared her for surgery and the clearance form has been faxed.

## 2013-05-15 HISTORY — PX: TOTAL KNEE ARTHROPLASTY: SHX125

## 2013-06-06 DIAGNOSIS — M1710 Unilateral primary osteoarthritis, unspecified knee: Secondary | ICD-10-CM | POA: Diagnosis not present

## 2013-06-06 DIAGNOSIS — Z01818 Encounter for other preprocedural examination: Secondary | ICD-10-CM | POA: Diagnosis not present

## 2013-06-13 DIAGNOSIS — R079 Chest pain, unspecified: Secondary | ICD-10-CM | POA: Diagnosis not present

## 2013-06-13 DIAGNOSIS — R197 Diarrhea, unspecified: Secondary | ICD-10-CM | POA: Diagnosis not present

## 2013-07-07 DIAGNOSIS — M25569 Pain in unspecified knee: Secondary | ICD-10-CM | POA: Diagnosis not present

## 2013-07-07 DIAGNOSIS — M6281 Muscle weakness (generalized): Secondary | ICD-10-CM | POA: Diagnosis not present

## 2013-07-07 DIAGNOSIS — M25669 Stiffness of unspecified knee, not elsewhere classified: Secondary | ICD-10-CM | POA: Diagnosis not present

## 2013-07-07 DIAGNOSIS — R269 Unspecified abnormalities of gait and mobility: Secondary | ICD-10-CM | POA: Diagnosis not present

## 2013-07-17 DIAGNOSIS — M6281 Muscle weakness (generalized): Secondary | ICD-10-CM | POA: Diagnosis not present

## 2013-07-17 DIAGNOSIS — M25669 Stiffness of unspecified knee, not elsewhere classified: Secondary | ICD-10-CM | POA: Diagnosis not present

## 2013-07-17 DIAGNOSIS — M25569 Pain in unspecified knee: Secondary | ICD-10-CM | POA: Diagnosis not present

## 2013-07-17 DIAGNOSIS — R269 Unspecified abnormalities of gait and mobility: Secondary | ICD-10-CM | POA: Diagnosis not present

## 2013-07-18 DIAGNOSIS — M1710 Unilateral primary osteoarthritis, unspecified knee: Secondary | ICD-10-CM | POA: Diagnosis not present

## 2013-07-22 DIAGNOSIS — Z87891 Personal history of nicotine dependence: Secondary | ICD-10-CM | POA: Diagnosis not present

## 2013-07-22 DIAGNOSIS — I10 Essential (primary) hypertension: Secondary | ICD-10-CM | POA: Diagnosis not present

## 2013-07-22 DIAGNOSIS — M25569 Pain in unspecified knee: Secondary | ICD-10-CM | POA: Diagnosis not present

## 2013-07-22 DIAGNOSIS — D649 Anemia, unspecified: Secondary | ICD-10-CM | POA: Diagnosis not present

## 2013-07-22 DIAGNOSIS — R4182 Altered mental status, unspecified: Secondary | ICD-10-CM | POA: Diagnosis not present

## 2013-07-22 DIAGNOSIS — Z881 Allergy status to other antibiotic agents status: Secondary | ICD-10-CM | POA: Diagnosis not present

## 2013-07-22 DIAGNOSIS — F29 Unspecified psychosis not due to a substance or known physiological condition: Secondary | ICD-10-CM | POA: Diagnosis not present

## 2013-07-22 DIAGNOSIS — M1710 Unilateral primary osteoarthritis, unspecified knee: Secondary | ICD-10-CM | POA: Diagnosis present

## 2013-07-22 DIAGNOSIS — E785 Hyperlipidemia, unspecified: Secondary | ICD-10-CM | POA: Diagnosis not present

## 2013-07-22 DIAGNOSIS — Z888 Allergy status to other drugs, medicaments and biological substances status: Secondary | ICD-10-CM | POA: Diagnosis not present

## 2013-07-22 DIAGNOSIS — Z880 Allergy status to penicillin: Secondary | ICD-10-CM | POA: Diagnosis not present

## 2013-07-26 DIAGNOSIS — Z96659 Presence of unspecified artificial knee joint: Secondary | ICD-10-CM | POA: Diagnosis not present

## 2013-07-26 DIAGNOSIS — Z471 Aftercare following joint replacement surgery: Secondary | ICD-10-CM | POA: Diagnosis not present

## 2013-07-26 DIAGNOSIS — R269 Unspecified abnormalities of gait and mobility: Secondary | ICD-10-CM | POA: Diagnosis not present

## 2013-07-26 DIAGNOSIS — Z7982 Long term (current) use of aspirin: Secondary | ICD-10-CM | POA: Diagnosis not present

## 2013-07-26 DIAGNOSIS — IMO0001 Reserved for inherently not codable concepts without codable children: Secondary | ICD-10-CM | POA: Diagnosis not present

## 2013-07-28 DIAGNOSIS — R269 Unspecified abnormalities of gait and mobility: Secondary | ICD-10-CM | POA: Diagnosis not present

## 2013-07-28 DIAGNOSIS — Z7982 Long term (current) use of aspirin: Secondary | ICD-10-CM | POA: Diagnosis not present

## 2013-07-28 DIAGNOSIS — IMO0001 Reserved for inherently not codable concepts without codable children: Secondary | ICD-10-CM | POA: Diagnosis not present

## 2013-07-28 DIAGNOSIS — Z96659 Presence of unspecified artificial knee joint: Secondary | ICD-10-CM | POA: Diagnosis not present

## 2013-07-28 DIAGNOSIS — Z471 Aftercare following joint replacement surgery: Secondary | ICD-10-CM | POA: Diagnosis not present

## 2013-07-30 DIAGNOSIS — Z7982 Long term (current) use of aspirin: Secondary | ICD-10-CM | POA: Diagnosis not present

## 2013-07-30 DIAGNOSIS — IMO0001 Reserved for inherently not codable concepts without codable children: Secondary | ICD-10-CM | POA: Diagnosis not present

## 2013-07-30 DIAGNOSIS — Z96659 Presence of unspecified artificial knee joint: Secondary | ICD-10-CM | POA: Diagnosis not present

## 2013-07-30 DIAGNOSIS — R269 Unspecified abnormalities of gait and mobility: Secondary | ICD-10-CM | POA: Diagnosis not present

## 2013-07-30 DIAGNOSIS — Z471 Aftercare following joint replacement surgery: Secondary | ICD-10-CM | POA: Diagnosis not present

## 2013-08-01 DIAGNOSIS — Z7982 Long term (current) use of aspirin: Secondary | ICD-10-CM | POA: Diagnosis not present

## 2013-08-01 DIAGNOSIS — Z471 Aftercare following joint replacement surgery: Secondary | ICD-10-CM | POA: Diagnosis not present

## 2013-08-01 DIAGNOSIS — IMO0001 Reserved for inherently not codable concepts without codable children: Secondary | ICD-10-CM | POA: Diagnosis not present

## 2013-08-01 DIAGNOSIS — Z96659 Presence of unspecified artificial knee joint: Secondary | ICD-10-CM | POA: Diagnosis not present

## 2013-08-01 DIAGNOSIS — R269 Unspecified abnormalities of gait and mobility: Secondary | ICD-10-CM | POA: Diagnosis not present

## 2013-08-05 DIAGNOSIS — Z7982 Long term (current) use of aspirin: Secondary | ICD-10-CM | POA: Diagnosis not present

## 2013-08-05 DIAGNOSIS — Z471 Aftercare following joint replacement surgery: Secondary | ICD-10-CM | POA: Diagnosis not present

## 2013-08-05 DIAGNOSIS — R269 Unspecified abnormalities of gait and mobility: Secondary | ICD-10-CM | POA: Diagnosis not present

## 2013-08-05 DIAGNOSIS — Z96659 Presence of unspecified artificial knee joint: Secondary | ICD-10-CM | POA: Diagnosis not present

## 2013-08-05 DIAGNOSIS — IMO0001 Reserved for inherently not codable concepts without codable children: Secondary | ICD-10-CM | POA: Diagnosis not present

## 2013-08-07 DIAGNOSIS — Z7982 Long term (current) use of aspirin: Secondary | ICD-10-CM | POA: Diagnosis not present

## 2013-08-07 DIAGNOSIS — Z96659 Presence of unspecified artificial knee joint: Secondary | ICD-10-CM | POA: Diagnosis not present

## 2013-08-07 DIAGNOSIS — IMO0001 Reserved for inherently not codable concepts without codable children: Secondary | ICD-10-CM | POA: Diagnosis not present

## 2013-08-07 DIAGNOSIS — Z471 Aftercare following joint replacement surgery: Secondary | ICD-10-CM | POA: Diagnosis not present

## 2013-08-07 DIAGNOSIS — R269 Unspecified abnormalities of gait and mobility: Secondary | ICD-10-CM | POA: Diagnosis not present

## 2013-08-09 DIAGNOSIS — IMO0001 Reserved for inherently not codable concepts without codable children: Secondary | ICD-10-CM | POA: Diagnosis not present

## 2013-08-09 DIAGNOSIS — Z96659 Presence of unspecified artificial knee joint: Secondary | ICD-10-CM | POA: Diagnosis not present

## 2013-08-09 DIAGNOSIS — R269 Unspecified abnormalities of gait and mobility: Secondary | ICD-10-CM | POA: Diagnosis not present

## 2013-08-09 DIAGNOSIS — Z7982 Long term (current) use of aspirin: Secondary | ICD-10-CM | POA: Diagnosis not present

## 2013-08-09 DIAGNOSIS — Z471 Aftercare following joint replacement surgery: Secondary | ICD-10-CM | POA: Diagnosis not present

## 2013-08-12 DIAGNOSIS — Z96659 Presence of unspecified artificial knee joint: Secondary | ICD-10-CM | POA: Diagnosis not present

## 2013-08-12 DIAGNOSIS — IMO0001 Reserved for inherently not codable concepts without codable children: Secondary | ICD-10-CM | POA: Diagnosis not present

## 2013-08-12 DIAGNOSIS — R269 Unspecified abnormalities of gait and mobility: Secondary | ICD-10-CM | POA: Diagnosis not present

## 2013-08-12 DIAGNOSIS — Z7982 Long term (current) use of aspirin: Secondary | ICD-10-CM | POA: Diagnosis not present

## 2013-08-12 DIAGNOSIS — Z471 Aftercare following joint replacement surgery: Secondary | ICD-10-CM | POA: Diagnosis not present

## 2013-08-14 DIAGNOSIS — Z7982 Long term (current) use of aspirin: Secondary | ICD-10-CM | POA: Diagnosis not present

## 2013-08-14 DIAGNOSIS — R269 Unspecified abnormalities of gait and mobility: Secondary | ICD-10-CM | POA: Diagnosis not present

## 2013-08-14 DIAGNOSIS — Z471 Aftercare following joint replacement surgery: Secondary | ICD-10-CM | POA: Diagnosis not present

## 2013-08-14 DIAGNOSIS — IMO0001 Reserved for inherently not codable concepts without codable children: Secondary | ICD-10-CM | POA: Diagnosis not present

## 2013-08-14 DIAGNOSIS — Z96659 Presence of unspecified artificial knee joint: Secondary | ICD-10-CM | POA: Diagnosis not present

## 2013-08-18 DIAGNOSIS — Z96659 Presence of unspecified artificial knee joint: Secondary | ICD-10-CM | POA: Diagnosis not present

## 2013-08-20 DIAGNOSIS — M25669 Stiffness of unspecified knee, not elsewhere classified: Secondary | ICD-10-CM | POA: Diagnosis not present

## 2013-08-20 DIAGNOSIS — R269 Unspecified abnormalities of gait and mobility: Secondary | ICD-10-CM | POA: Diagnosis not present

## 2013-08-20 DIAGNOSIS — M25569 Pain in unspecified knee: Secondary | ICD-10-CM | POA: Diagnosis not present

## 2013-08-20 DIAGNOSIS — M6281 Muscle weakness (generalized): Secondary | ICD-10-CM | POA: Diagnosis not present

## 2013-08-21 DIAGNOSIS — M6281 Muscle weakness (generalized): Secondary | ICD-10-CM | POA: Diagnosis not present

## 2013-08-21 DIAGNOSIS — M25669 Stiffness of unspecified knee, not elsewhere classified: Secondary | ICD-10-CM | POA: Diagnosis not present

## 2013-08-21 DIAGNOSIS — M25569 Pain in unspecified knee: Secondary | ICD-10-CM | POA: Diagnosis not present

## 2013-08-21 DIAGNOSIS — R269 Unspecified abnormalities of gait and mobility: Secondary | ICD-10-CM | POA: Diagnosis not present

## 2013-08-23 ENCOUNTER — Other Ambulatory Visit: Payer: Self-pay | Admitting: Cardiovascular Disease

## 2013-08-25 DIAGNOSIS — M25569 Pain in unspecified knee: Secondary | ICD-10-CM | POA: Diagnosis not present

## 2013-08-25 DIAGNOSIS — M6281 Muscle weakness (generalized): Secondary | ICD-10-CM | POA: Diagnosis not present

## 2013-08-25 DIAGNOSIS — M25669 Stiffness of unspecified knee, not elsewhere classified: Secondary | ICD-10-CM | POA: Diagnosis not present

## 2013-08-25 DIAGNOSIS — R269 Unspecified abnormalities of gait and mobility: Secondary | ICD-10-CM | POA: Diagnosis not present

## 2013-08-26 ENCOUNTER — Encounter: Payer: Self-pay | Admitting: Cardiovascular Disease

## 2013-08-26 ENCOUNTER — Ambulatory Visit (INDEPENDENT_AMBULATORY_CARE_PROVIDER_SITE_OTHER): Payer: Medicare Other | Admitting: Cardiovascular Disease

## 2013-08-26 VITALS — BP 150/78 | HR 83 | Ht 65.0 in | Wt 136.8 lb

## 2013-08-26 DIAGNOSIS — E785 Hyperlipidemia, unspecified: Secondary | ICD-10-CM | POA: Diagnosis not present

## 2013-08-26 DIAGNOSIS — I498 Other specified cardiac arrhythmias: Secondary | ICD-10-CM

## 2013-08-26 DIAGNOSIS — I10 Essential (primary) hypertension: Secondary | ICD-10-CM | POA: Diagnosis not present

## 2013-08-26 DIAGNOSIS — I471 Supraventricular tachycardia: Secondary | ICD-10-CM

## 2013-08-26 MED ORDER — CARVEDILOL 6.25 MG PO TABS: 6.2500 mg | ORAL_TABLET | Freq: Two times a day (BID) | ORAL | Status: DC

## 2013-08-26 NOTE — Assessment & Plan Note (Signed)
No recurrent arrhythmia. 

## 2013-08-26 NOTE — Assessment & Plan Note (Signed)
Recommend a followup fasting lipid and liver profile which was ordered. Continue small dose Crestor.

## 2013-08-26 NOTE — Progress Notes (Signed)
HPI  This is a 79 year old female who is here today for a followup visit  . 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 No recurrent episodes since then. She had cardiac catheterization in November of 2012 which showed no significant coronary artery disease. She also has history of hypertension with possible white coat syndrome.  she has been tolerating small dose Crestor 5 mg daily for severe hyperlipidemia.  She underwent right knee placement last month without complications. Blood pressure has been labile recently with significant drop after she takes carvedilol. This has caused significant dizziness. She decrease the dose to 6.25 mg with improvement in symptoms. Otherwise she has been doing well.  Allergies  Allergen Reactions  . Penicillins   . Statins      Current Outpatient Prescriptions on File Prior to Visit  Medication Sig Dispense Refill  . aspirin 81 MG tablet Take 81 mg by mouth daily.      Marland Kitchen. b complex vitamins tablet Take 1 tablet by mouth daily.      . Calcium Carb-Cholecalciferol (CALCIUM + D3) 600-200 MG-UNIT TABS Take by mouth 2 (two) times daily.      . Coenzyme Q10 200 MG capsule Take 200 mg by mouth daily.      . CRESTOR 5 MG tablet TAKE 1 TABLET BY MOUTH EVERY DAY  30 tablet  3  . hydrochlorothiazide (HYDRODIURIL) 25 MG tablet Take 1 tablet (25 mg total) by mouth daily.  30 tablet  6  . irbesartan (AVAPRO) 150 MG tablet Take 1 tablet (150 mg total) by mouth daily.  30 tablet  6  . naproxen (NAPROSYN) 250 MG tablet Take 250 mg by mouth as needed.       . Omega-3 Fatty Acids (ULTRA OMEGA-3 FISH OIL PO) Take 1,200 mg by mouth daily.        No current facility-administered medications on file prior to visit.     Past Medical History  Diagnosis Date  . Dyslipidemia   . HOH (hard of hearing)   . Heart murmur   . H/O scarlet fever   . Hypertension   . SVT (supraventricular  tachycardia)     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 [place]vie [place] N[place]  . Ectopic atrial tachycardia   . Hyperlipidemia      Past Surgical History  Procedure Laterality Date  . Tonsillectomy    . Vaginal hysterectomy    . Knee arthroscopy w/ meniscal repair      right     Family History  Problem Relation Age of Onset  . Family history unknown: Yes     History   Social History  . Marital Status: Divorced    Spouse Name: N/A    Number of Children: N/A  . Years of Education: N/A   Occupational History  . Not on file.   Social History Main Topics  . Smoking status: Former Smoker -- 1.00 packs/day for 10 years    Types: Cigarettes  . Smokeless tobacco: Not on file  . Alcohol Use: No  . Drug Use: No  . Sexual Activity: Not on file   Other Topics Concern  . Not on file   Social History Narrative  . No narrative on file      PHYSICAL EXAM   BP 150/78  Pulse 83  Ht 5\' 5"  (1.651 m)  Wt 136 lb 12 oz (62.029 kg)  BMI  22.76 kg/m2 Constitutional: She is oriented to person, place, and time. She appears well-developed and well-nourished. No distress.  HENT: No nasal discharge.  Head: Normocephalic and atraumatic.  Eyes: Pupils are equal and round. Right eye exhibits no discharge. Left eye exhibits no discharge.  Neck: Normal range of motion. Neck supple. No JVD present. No thyromegaly present.  Cardiovascular: Normal rate, regular rhythm, normal heart sounds. Exam reveals no gallop and no friction rub. No murmur heard.  Pulmonary/Chest: Effort normal and breath sounds normal. No stridor. No respiratory distress. She has no wheezes. She has no rales. She exhibits no tenderness.  Abdominal: Soft. Bowel sounds are normal. She exhibits no distension. There is no tenderness. There is no rebound and no guarding.  Musculoskeletal: Normal range of motion. She exhibits no edema and no tenderness.  Neurological: She is alert and oriented to person,  place, and time. Coordination normal.  Skin: Skin is warm and dry. No rash noted. She is not diaphoretic. No erythema. No pallor.  Psychiatric: She has a normal mood and affect. Her behavior is normal. Judgment and thought content normal.     EKG: Normal sinus rhythm   ASSESSMENT AND PLAN

## 2013-08-26 NOTE — Patient Instructions (Signed)
Your Coreg has been refilled   Your physician recommends that you return for lab work in:  Schedule fasting labs in the next few weeks   Your physician wants you to follow-up in: 6 months. You will receive a reminder letter in the mail two months in advance. If you don't receive a letter, please call our office to schedule the follow-up appointment.

## 2013-08-26 NOTE — Assessment & Plan Note (Signed)
I reviewed home blood pressure readings. Most of them are within acceptable range and less than 150 systolic. She does not tolerate systolic blood pressure lower than 120 mm mercury systolic. This has correlated with symptoms of dizziness. I think it's reasonable to continue with current dose of carvedilol as well as Avapro and hydrochlorothiazide.

## 2013-09-02 DIAGNOSIS — M25669 Stiffness of unspecified knee, not elsewhere classified: Secondary | ICD-10-CM | POA: Diagnosis not present

## 2013-09-02 DIAGNOSIS — M25569 Pain in unspecified knee: Secondary | ICD-10-CM | POA: Diagnosis not present

## 2013-09-02 DIAGNOSIS — M6281 Muscle weakness (generalized): Secondary | ICD-10-CM | POA: Diagnosis not present

## 2013-09-02 DIAGNOSIS — R269 Unspecified abnormalities of gait and mobility: Secondary | ICD-10-CM | POA: Diagnosis not present

## 2013-09-04 DIAGNOSIS — M25669 Stiffness of unspecified knee, not elsewhere classified: Secondary | ICD-10-CM | POA: Diagnosis not present

## 2013-09-04 DIAGNOSIS — M6281 Muscle weakness (generalized): Secondary | ICD-10-CM | POA: Diagnosis not present

## 2013-09-04 DIAGNOSIS — R269 Unspecified abnormalities of gait and mobility: Secondary | ICD-10-CM | POA: Diagnosis not present

## 2013-09-04 DIAGNOSIS — M25569 Pain in unspecified knee: Secondary | ICD-10-CM | POA: Diagnosis not present

## 2013-09-09 ENCOUNTER — Ambulatory Visit (INDEPENDENT_AMBULATORY_CARE_PROVIDER_SITE_OTHER): Payer: Medicare Other | Admitting: Family Medicine

## 2013-09-09 ENCOUNTER — Ambulatory Visit (INDEPENDENT_AMBULATORY_CARE_PROVIDER_SITE_OTHER): Payer: Medicare Other | Admitting: *Deleted

## 2013-09-09 ENCOUNTER — Encounter: Payer: Self-pay | Admitting: Family Medicine

## 2013-09-09 VITALS — BP 128/72 | HR 95 | Temp 97.5°F | Ht 64.5 in | Wt 139.8 lb

## 2013-09-09 DIAGNOSIS — I498 Other specified cardiac arrhythmias: Secondary | ICD-10-CM

## 2013-09-09 DIAGNOSIS — Z23 Encounter for immunization: Secondary | ICD-10-CM | POA: Diagnosis not present

## 2013-09-09 DIAGNOSIS — M25569 Pain in unspecified knee: Secondary | ICD-10-CM | POA: Diagnosis not present

## 2013-09-09 DIAGNOSIS — Z2911 Encounter for prophylactic immunotherapy for respiratory syncytial virus (RSV): Secondary | ICD-10-CM

## 2013-09-09 DIAGNOSIS — Z1231 Encounter for screening mammogram for malignant neoplasm of breast: Secondary | ICD-10-CM | POA: Diagnosis not present

## 2013-09-09 DIAGNOSIS — E785 Hyperlipidemia, unspecified: Secondary | ICD-10-CM

## 2013-09-09 DIAGNOSIS — I471 Supraventricular tachycardia, unspecified: Secondary | ICD-10-CM

## 2013-09-09 DIAGNOSIS — I10 Essential (primary) hypertension: Secondary | ICD-10-CM | POA: Diagnosis not present

## 2013-09-09 DIAGNOSIS — Z1211 Encounter for screening for malignant neoplasm of colon: Secondary | ICD-10-CM

## 2013-09-09 DIAGNOSIS — R269 Unspecified abnormalities of gait and mobility: Secondary | ICD-10-CM | POA: Diagnosis not present

## 2013-09-09 DIAGNOSIS — M6281 Muscle weakness (generalized): Secondary | ICD-10-CM | POA: Diagnosis not present

## 2013-09-09 DIAGNOSIS — M25669 Stiffness of unspecified knee, not elsewhere classified: Secondary | ICD-10-CM | POA: Diagnosis not present

## 2013-09-09 NOTE — Assessment & Plan Note (Signed)
On crestor. Awaiting lab results.

## 2013-09-09 NOTE — Progress Notes (Signed)
Pre visit review using our clinic review tool, if applicable. No additional management support is needed unless otherwise documented below in the visit note. 

## 2013-09-09 NOTE — Assessment & Plan Note (Signed)
S/p ablation No recurrent symptoms.

## 2013-09-09 NOTE — Patient Instructions (Addendum)
   Please call to set up your mammogram.  We will call you with your GI referral.

## 2013-09-09 NOTE — Progress Notes (Signed)
Subjective:   Patient ID: Ashley Hill, female    DOB: 28-Dec-1934, 79 y.o.   MRN: [id number]  Ashley Hill is a pleasant 79 y.o. year old female who presents to clinic today with Establish Care  on 09/09/2013  HPI: [place] to be closer to her grandchildren.  H/o SVT-  S/p ablation in 2012.  No recurrent issues since ablation.  No h/o CAD. Sees Dr. Kirke CorinArida.  Last saw him on 08/26/2013- note reviewed. He placed on her crestor for HLD- LDL was 165.  Had myalgias with lipitor but has not had any issues with Crestor. Had fasting labs done this am for Dr. Kirke CorinArida (cholesterol and liver function).  HTN- feels bad when systolic BP is less than 120.  Has not had a medicare wellness exam.  Last mammogram was in 2011. Has never had zostavax or pneumovax. Due for colonoscopy.  Remote h/o hysterectomy.  Patient Active Problem List   Diagnosis Date Noted  . Preop cardiovascular exam 04/21/2013  . Chest pain 07/22/2012  . SVT (supraventricular tachycardia)   . Hypertension   . Hyperlipidemia    Past Medical History  Diagnosis Date  . Dyslipidemia   . HOH (hard of hearing)   . Heart murmur   . H/O scarlet fever   . Hypertension   . 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 Harvie BridgeSagi N. [place], [place].  . Ectopic atrial tachycardia   . Hyperlipidemia    Past Surgical History  Procedure Laterality Date  . Tonsillectomy    . Vaginal hysterectomy    . Knee arthroscopy w/ meniscal repair      right  . Total knee arthroplasty  2015   History  Substance Use Topics  . Smoking status: Former Smoker -- 1.00 packs/day for 10 years    Types: Cigarettes  . Smokeless tobacco: Never Used  . Alcohol Use: No   Family History  Problem Relation Age of Onset  . Alcohol abuse Father   . Cancer Father    Allergies  Allergen Reactions  . Penicillins   . Statins    Current Outpatient Prescriptions on File Prior  to Visit  Medication Sig Dispense Refill  . aspirin 81 MG tablet Take 81 mg by mouth daily.      Marland Kitchen. b complex vitamins tablet Take 1 tablet by mouth daily.      . Calcium Carb-Cholecalciferol (CALCIUM + D3) 600-200 MG-UNIT TABS Take by mouth 2 (two) times daily.      . carvedilol (COREG) 6.25 MG tablet Take 1 tablet (6.25 mg total) by mouth 2 (two) times daily with a meal.  60 tablet  6  . Coenzyme Q10 200 MG capsule Take 200 mg by mouth daily.      . CRESTOR 5 MG tablet TAKE 1 TABLET BY MOUTH EVERY DAY  30 tablet  3  . hydrochlorothiazide (HYDRODIURIL) 25 MG tablet Take 1 tablet (25 mg total) by mouth daily.  30 tablet  6  . irbesartan (AVAPRO) 150 MG tablet Take 1 tablet (150 mg total) by mouth daily.  30 tablet  6  . naproxen (NAPROSYN) 250 MG tablet Take 250 mg by mouth as needed.       . Omega-3 Fatty Acids (ULTRA OMEGA-3 FISH OIL PO) Take 1,200 mg by mouth daily.        No current facility-administered medications on file prior to visit.   The  PMH, PSH, Social History, Family History, Medications, and allergies [facility], and have been updated if relevant.  Review of Systems    see HPI No chest pain No changes in her bowel habits No blood in her stool Objective:    BP 128/72  Pulse 95  Temp(Src) 97.5 F (36.4 C) (Oral)  Ht 5' 4.5" (1.638 m)  Wt 139 lb 12 oz (63.39 kg)  BMI 23.63 kg/m2  SpO2 97%   Physical Exam  Nursing note and vitals reviewed. Constitutional: She is oriented to person, place, and time. She appears well-developed and well-nourished. No distress.  HENT:  Head: Normocephalic and atraumatic.  Eyes: Pupils are equal, round, and reactive to light.  Neck: Normal range of motion.  Cardiovascular: Normal rate and regular rhythm.   Pulmonary/Chest: Effort normal and breath sounds normal.  Abdominal: Soft. Bowel sounds are normal. She exhibits no distension and no mass. There is no tenderness. There is no rebound and no guarding.  Neurological:  She is oriented to person, place, and time. She has normal reflexes.  Psychiatric: She has a normal mood and affect. Her behavior is normal. Judgment and thought content normal.          Assessment & Plan:   Hypertension  Hyperlipidemia  Encounter for screening colonoscopy - Plan: Ambulatory referral to Gastroenterology  Other screening mammogram - Plan: MM Digital Screening  Need for shingles vaccine - Plan: Varicella-zoster vaccine subcutaneous Return in about 3 months (around 12/09/2013) for medicare wellness visit.

## 2013-09-09 NOTE — Assessment & Plan Note (Signed)
Well controlled. No changes. 

## 2013-09-10 ENCOUNTER — Telehealth: Payer: Self-pay | Admitting: Family Medicine

## 2013-09-10 LAB — HEPATIC FUNCTION PANEL
ALK PHOS: 78 IU/L (ref 39–117)
ALT: 19 IU/L (ref 0–32)
AST: 22 IU/L (ref 0–40)
Albumin: 4.6 g/dL (ref 3.5–4.8)
BILIRUBIN DIRECT: 0.2 mg/dL (ref 0.00–0.40)
Total Bilirubin: 0.8 mg/dL (ref 0.0–1.2)
Total Protein: 6.5 g/dL (ref 6.0–8.5)

## 2013-09-10 LAB — LIPID PANEL
Chol/HDL Ratio: 3.6 ratio units (ref 0.0–4.4)
Cholesterol, Total: 200 mg/dL — ABNORMAL HIGH (ref 100–199)
HDL: 56 mg/dL (ref >39)
LDL Calculated: 121 mg/dL — ABNORMAL HIGH (ref 0–99)
Triglycerides: 117 mg/dL (ref 0–149)
VLDL Cholesterol Cal: 23 mg/dL (ref 5–40)

## 2013-09-10 NOTE — Telephone Encounter (Signed)
Relevant patient education assigned to patient using Emmi. ° °

## 2013-09-11 DIAGNOSIS — R269 Unspecified abnormalities of gait and mobility: Secondary | ICD-10-CM | POA: Diagnosis not present

## 2013-09-11 DIAGNOSIS — M25669 Stiffness of unspecified knee, not elsewhere classified: Secondary | ICD-10-CM | POA: Diagnosis not present

## 2013-09-11 DIAGNOSIS — M25569 Pain in unspecified knee: Secondary | ICD-10-CM | POA: Diagnosis not present

## 2013-09-11 DIAGNOSIS — M6281 Muscle weakness (generalized): Secondary | ICD-10-CM | POA: Diagnosis not present

## 2013-09-15 DIAGNOSIS — Z1231 Encounter for screening mammogram for malignant neoplasm of breast: Secondary | ICD-10-CM | POA: Diagnosis not present

## 2013-09-16 ENCOUNTER — Encounter: Payer: Self-pay | Admitting: Family Medicine

## 2013-09-16 DIAGNOSIS — M25669 Stiffness of unspecified knee, not elsewhere classified: Secondary | ICD-10-CM | POA: Diagnosis not present

## 2013-09-16 DIAGNOSIS — M25569 Pain in unspecified knee: Secondary | ICD-10-CM | POA: Diagnosis not present

## 2013-09-16 DIAGNOSIS — M6281 Muscle weakness (generalized): Secondary | ICD-10-CM | POA: Diagnosis not present

## 2013-09-16 DIAGNOSIS — R269 Unspecified abnormalities of gait and mobility: Secondary | ICD-10-CM | POA: Diagnosis not present

## 2013-09-18 DIAGNOSIS — M6281 Muscle weakness (generalized): Secondary | ICD-10-CM | POA: Diagnosis not present

## 2013-09-18 DIAGNOSIS — R269 Unspecified abnormalities of gait and mobility: Secondary | ICD-10-CM | POA: Diagnosis not present

## 2013-09-18 DIAGNOSIS — M25569 Pain in unspecified knee: Secondary | ICD-10-CM | POA: Diagnosis not present

## 2013-09-18 DIAGNOSIS — M25669 Stiffness of unspecified knee, not elsewhere classified: Secondary | ICD-10-CM | POA: Diagnosis not present

## 2013-09-23 DIAGNOSIS — R269 Unspecified abnormalities of gait and mobility: Secondary | ICD-10-CM | POA: Diagnosis not present

## 2013-09-23 DIAGNOSIS — M25569 Pain in unspecified knee: Secondary | ICD-10-CM | POA: Diagnosis not present

## 2013-09-23 DIAGNOSIS — M6281 Muscle weakness (generalized): Secondary | ICD-10-CM | POA: Diagnosis not present

## 2013-09-23 DIAGNOSIS — M25669 Stiffness of unspecified knee, not elsewhere classified: Secondary | ICD-10-CM | POA: Diagnosis not present

## 2013-09-24 DIAGNOSIS — IMO0001 Reserved for inherently not codable concepts without codable children: Secondary | ICD-10-CM | POA: Diagnosis not present

## 2013-09-25 DIAGNOSIS — M25669 Stiffness of unspecified knee, not elsewhere classified: Secondary | ICD-10-CM | POA: Diagnosis not present

## 2013-09-25 DIAGNOSIS — M6281 Muscle weakness (generalized): Secondary | ICD-10-CM | POA: Diagnosis not present

## 2013-09-25 DIAGNOSIS — R269 Unspecified abnormalities of gait and mobility: Secondary | ICD-10-CM | POA: Diagnosis not present

## 2013-09-25 DIAGNOSIS — M25569 Pain in unspecified knee: Secondary | ICD-10-CM | POA: Diagnosis not present

## 2013-09-30 DIAGNOSIS — M25669 Stiffness of unspecified knee, not elsewhere classified: Secondary | ICD-10-CM | POA: Diagnosis not present

## 2013-09-30 DIAGNOSIS — M6281 Muscle weakness (generalized): Secondary | ICD-10-CM | POA: Diagnosis not present

## 2013-09-30 DIAGNOSIS — R269 Unspecified abnormalities of gait and mobility: Secondary | ICD-10-CM | POA: Diagnosis not present

## 2013-09-30 DIAGNOSIS — M25569 Pain in unspecified knee: Secondary | ICD-10-CM | POA: Diagnosis not present

## 2013-10-01 DIAGNOSIS — M25669 Stiffness of unspecified knee, not elsewhere classified: Secondary | ICD-10-CM | POA: Diagnosis not present

## 2013-10-21 DIAGNOSIS — M25569 Pain in unspecified knee: Secondary | ICD-10-CM | POA: Diagnosis not present

## 2013-10-21 DIAGNOSIS — M25669 Stiffness of unspecified knee, not elsewhere classified: Secondary | ICD-10-CM | POA: Diagnosis not present

## 2013-10-21 DIAGNOSIS — R269 Unspecified abnormalities of gait and mobility: Secondary | ICD-10-CM | POA: Diagnosis not present

## 2013-10-23 DIAGNOSIS — M25669 Stiffness of unspecified knee, not elsewhere classified: Secondary | ICD-10-CM | POA: Diagnosis not present

## 2013-10-23 DIAGNOSIS — M6281 Muscle weakness (generalized): Secondary | ICD-10-CM | POA: Diagnosis not present

## 2013-10-23 DIAGNOSIS — R269 Unspecified abnormalities of gait and mobility: Secondary | ICD-10-CM | POA: Diagnosis not present

## 2013-10-23 DIAGNOSIS — M25569 Pain in unspecified knee: Secondary | ICD-10-CM | POA: Diagnosis not present

## 2013-10-28 DIAGNOSIS — M25669 Stiffness of unspecified knee, not elsewhere classified: Secondary | ICD-10-CM | POA: Diagnosis not present

## 2013-10-28 DIAGNOSIS — M25569 Pain in unspecified knee: Secondary | ICD-10-CM | POA: Diagnosis not present

## 2013-10-28 DIAGNOSIS — R269 Unspecified abnormalities of gait and mobility: Secondary | ICD-10-CM | POA: Diagnosis not present

## 2013-11-04 DIAGNOSIS — R269 Unspecified abnormalities of gait and mobility: Secondary | ICD-10-CM | POA: Diagnosis not present

## 2013-11-04 DIAGNOSIS — M25669 Stiffness of unspecified knee, not elsewhere classified: Secondary | ICD-10-CM | POA: Diagnosis not present

## 2013-11-04 DIAGNOSIS — M25569 Pain in unspecified knee: Secondary | ICD-10-CM | POA: Diagnosis not present

## 2013-11-13 ENCOUNTER — Telehealth: Payer: Self-pay

## 2013-11-13 MED ORDER — CARVEDILOL 12.5 MG PO TABS: 12.5000 mg | ORAL_TABLET | Freq: Two times a day (BID) | ORAL | Status: DC

## 2013-11-13 MED ORDER — CARVEDILOL 6.25 MG PO TABS: 12.5000 mg | ORAL_TABLET | Freq: Two times a day (BID) | ORAL | Status: DC

## 2013-11-13 NOTE — Telephone Encounter (Signed)
LVM 11/13/13

## 2013-11-13 NOTE — Telephone Encounter (Signed)
Pt states she took one Carvedilol, her BP came down to 168/81. Please call.

## 2013-11-13 NOTE — Telephone Encounter (Signed)
Instructed patient per Dr. Kirke CorinArida increase Coreg to 12.5 mg twice a day  Continue to monitor blood pressure  Contact EMS or go to the ED if situation becomes emergent  Patient verbalized understanding

## 2013-11-13 NOTE — Telephone Encounter (Signed)
We can try increasing Coreg to 12.5 mg bid and see how she feels.

## 2013-11-13 NOTE — Telephone Encounter (Signed)
[facility]  She became very dizzy and had to sit for 30 minutes before she could walk again  She continues to feel bad for a few hours  She was in her kitchen doing crafts today and she had the same feeling  It lasted about 20 minutes she is no longer dizzy but she does feel weak  She took her vitals her blood pressure is 189/91 pulse 78 She denies any other symptoms   I informed patient that I would discuss this with Dr Kirke CorinArida and call her back on her cell phone Patients cell is [phone] Instructed patient to contact EMS if she feels her situation becomes emergent

## 2013-11-13 NOTE — Telephone Encounter (Signed)
[facility], states she almost passed out, had the same feeling, dizziness,  about 15 minutes ago,  states her BP 189/91, pulse 78. She had already taken her medications.

## 2013-12-07 DIAGNOSIS — IMO0002 Reserved for concepts with insufficient information to code with codable children: Secondary | ICD-10-CM | POA: Diagnosis not present

## 2013-12-09 ENCOUNTER — Ambulatory Visit (INDEPENDENT_AMBULATORY_CARE_PROVIDER_SITE_OTHER): Payer: Medicare Other | Admitting: Family Medicine

## 2013-12-09 ENCOUNTER — Encounter: Payer: Self-pay | Admitting: Internal Medicine

## 2013-12-09 ENCOUNTER — Encounter: Payer: Self-pay | Admitting: Family Medicine

## 2013-12-09 VITALS — BP 136/76 | HR 88 | Temp 97.4°F | Ht 64.5 in | Wt 139.2 lb

## 2013-12-09 DIAGNOSIS — R42 Dizziness and giddiness: Secondary | ICD-10-CM

## 2013-12-09 DIAGNOSIS — Z0000 Encounter for general adult medical examination without abnormal findings: Secondary | ICD-10-CM | POA: Diagnosis not present

## 2013-12-09 DIAGNOSIS — Z23 Encounter for immunization: Secondary | ICD-10-CM | POA: Diagnosis not present

## 2013-12-09 DIAGNOSIS — I498 Other specified cardiac arrhythmias: Secondary | ICD-10-CM | POA: Diagnosis not present

## 2013-12-09 DIAGNOSIS — E785 Hyperlipidemia, unspecified: Secondary | ICD-10-CM

## 2013-12-09 DIAGNOSIS — I10 Essential (primary) hypertension: Secondary | ICD-10-CM

## 2013-12-09 DIAGNOSIS — I471 Supraventricular tachycardia: Secondary | ICD-10-CM

## 2013-12-09 DIAGNOSIS — R58 Hemorrhage, not elsewhere classified: Secondary | ICD-10-CM | POA: Diagnosis not present

## 2013-12-09 LAB — CBC WITH DIFFERENTIAL/PLATELET
BASOS PCT: 0.3 % (ref 0.0–3.0)
Basophils Absolute: 0 10*3/uL (ref 0.0–0.1)
Eosinophils Absolute: 0.1 10*3/uL (ref 0.0–0.7)
Eosinophils Relative: 0.6 % (ref 0.0–5.0)
HCT: 37 % (ref 36.0–46.0)
HEMOGLOBIN: 12.6 g/dL (ref 12.0–15.0)
Lymphocytes Relative: 15.3 % (ref 12.0–46.0)
Lymphs Abs: 1.5 10*3/uL (ref 0.7–4.0)
MCHC: 33.9 g/dL (ref 30.0–36.0)
MCV: 91.7 fl (ref 78.0–100.0)
MONOS PCT: 6.2 % (ref 3.0–12.0)
Monocytes Absolute: 0.6 10*3/uL (ref 0.1–1.0)
NEUTROS ABS: 7.8 10*3/uL — AB (ref 1.4–7.7)
Neutrophils Relative %: 77.6 % — ABNORMAL HIGH (ref 43.0–77.0)
Platelets: 252 10*3/uL (ref 150.0–400.0)
RBC: 4.03 Mil/uL (ref 3.87–5.11)
RDW: 13.1 % (ref 11.5–15.5)
WBC: 10.1 10*3/uL (ref 4.0–10.5)

## 2013-12-09 LAB — COMPREHENSIVE METABOLIC PANEL
ALBUMIN: 4.3 g/dL (ref 3.5–5.2)
ALT: 23 U/L (ref 0–35)
AST: 27 U/L (ref 0–37)
Alkaline Phosphatase: 56 U/L (ref 39–117)
BUN: 26 mg/dL — ABNORMAL HIGH (ref 6–23)
CALCIUM: 9.9 mg/dL (ref 8.4–10.5)
CHLORIDE: 101 meq/L (ref 96–112)
CO2: 31 meq/L (ref 19–32)
Creatinine, Ser: 0.8 mg/dL (ref 0.4–1.2)
GFR: 70.86 mL/min (ref >60.00)
GLUCOSE: 120 mg/dL — AB (ref 70–99)
POTASSIUM: 3.3 meq/L — AB (ref 3.5–5.1)
SODIUM: 140 meq/L (ref 135–145)
Total Bilirubin: 1.1 mg/dL (ref 0.2–1.2)
Total Protein: 7.2 g/dL (ref 6.0–8.3)

## 2013-12-09 LAB — PROTIME-INR
INR: 1 ratio (ref 0.8–1.0)
Prothrombin Time: 10.8 s (ref 9.6–13.1)

## 2013-12-09 LAB — TSH: TSH: 1.22 u[IU]/mL (ref 0.35–4.50)

## 2013-12-09 LAB — APTT: APTT: 28.6 s (ref 23.4–32.7)

## 2013-12-09 MED ORDER — MECLIZINE HCL 12.5 MG PO TABS: 12.5000 mg | ORAL_TABLET | Freq: Three times a day (TID) | ORAL | Status: DC | PRN

## 2013-12-09 NOTE — Progress Notes (Signed)
Subjective:   Patient ID: Ashley Hill, female    DOB: [birth year], 79 y.o.   MRN: [id number]  Ashley Hill is a pleasant 79 y.o. year old female who presents to clinic today with Annual Exam and Hypertension  on 12/09/2013  HPI: Moved here from [place], [place] two years ago to be closer to her grandchildren.  Established care with me in 08/2013.  I have personally reviewed the Medicare Annual Wellness questionnaire and have noted 1. The patient's medical and social history 2. Their use of alcohol, tobacco or illicit drugs 3. Their current medications and supplements 4. The patient's functional ability including ADL's, fall risks, home safety risks and hearing or visual             impairment. 5. Diet and physical activities 6. Evidence for depression or mood disorders  End of life wishes discussed and updated in Social History.  The roster of all physicians providing medical care to patient - is listed in the Snapshot section of the chart.  Zoster 09/09/13 Mammogram 10/03/13 Due for colonoscopy (last done in 07/2000 in [facility])- referred her for colonoscopy in 08/2013 but was told they could not schedule until she got a copy of previous colonoscopy from [facility].  When she called office in [facility], she was told they don't keep records old than 10 years.  Remote h/o hysterectomy.  Dizziness- has been dizzy intermittently since [place].  Worse when she gets up from bed and changes head position.  Started bleeding from both lower legs a couple of days later.  Brings in pictures-  "fountains of blood pouring out from both legs."  Bleeding stopped within a hour or so. Never had anything like this before.  Takes ASA, no coumadin or other blood thinners.  No blood in urine or stool.  Still feels dizzy now.  NO CP or SOB.  H/o SVT-  S/p ablation in 2012.  No recurrent issues since ablation.  No h/o CAD. Sees Dr. Kirke Corin.  Last saw him on 08/26/2013- note reviewed. He placed  on her crestor for HLD- LDL was 165.  Had myalgias with lipitor but has not had any issues with Crestor.  HTN- on Coreg 12.5 mg twice daily, HCTZ 25 mg daily and Avapro 150 mg daily. She has been concerned about her BP- called Dr. Jari Sportsman office, Coreg was increased to current dose.   Lab Results  Component Value Date   CREATININE 0.73 04/29/2013   Lab Results  Component Value Date   HDL 56 09/09/2013   LDLCALC 121* 09/09/2013   TRIG 117 09/09/2013   CHOLHDL 3.6 09/09/2013   Lab Results  Component Value Date   ALT 19 09/09/2013   AST 22 09/09/2013   ALKPHOS 78 09/09/2013   BILITOT 0.8 09/09/2013     Patient Active Problem List   Diagnosis Date Noted  . Medicare annual wellness visit, initial 12/09/2013  . Preop cardiovascular exam 04/21/2013  . Chest pain 07/22/2012  . SVT (supraventricular tachycardia)   . Hypertension   . Hyperlipidemia    Past Medical History  Diagnosis Date  . Dyslipidemia   . HOH (hard of hearing)   . Heart murmur   . H/O scarlet fever   . Hypertension   . 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 Harvie Bridge, [place].  . Ectopic atrial tachycardia   . Hyperlipidemia    Past  Surgical History  Procedure Laterality Date  . Tonsillectomy    . Vaginal hysterectomy    . Knee arthroscopy w/ meniscal repair      right  . Total knee arthroplasty  2015   History  Substance Use Topics  . Smoking status: Former Smoker -- 1.00 packs/day for 10 years    Types: Cigarettes  . Smokeless tobacco: Never Used  . Alcohol Use: No   Family History  Problem Relation Age of Onset  . Alcohol abuse Father   . Cancer Father    Allergies  Allergen Reactions  . Penicillins   . Statins    Current Outpatient Prescriptions on File Prior to Visit  Medication Sig Dispense Refill  . aspirin 81 MG tablet Take 81 mg by mouth daily.      Marland Kitchen. b complex vitamins tablet Take 1 tablet by mouth daily.      . Calcium  Carb-Cholecalciferol (CALCIUM + D3) 600-200 MG-UNIT TABS Take by mouth 2 (two) times daily.      . carvedilol (COREG) 12.5 MG tablet Take 1 tablet (12.5 mg total) by mouth 2 (two) times daily.  180 tablet  3  . Coenzyme Q10 200 MG capsule Take 200 mg by mouth daily.      . CRESTOR 5 MG tablet TAKE 1 TABLET BY MOUTH EVERY DAY  30 tablet  3  . hydrochlorothiazide (HYDRODIURIL) 25 MG tablet Take 1 tablet (25 mg total) by mouth daily.  30 tablet  6  . irbesartan (AVAPRO) 150 MG tablet Take 1 tablet (150 mg total) by mouth daily.  30 tablet  6  . naproxen (NAPROSYN) 250 MG tablet Take 250 mg by mouth as needed.       . Omega-3 Fatty Acids (ULTRA OMEGA-3 FISH OIL PO) Take 1,200 mg by mouth daily.        No current facility-administered medications on file prior to visit.   The PMH, PSH, Social History, Family History, Medications, and allergies have been [facility], and have been updated if relevant.  Review of Systems    see HPI No chest pain No changes in her bowel habits No blood in her stool No anxiety or depression  Objective:    BP 136/76  Pulse 88  Temp(Src) 97.4 F (36.3 C) (Oral)  Ht 5' 4.5" (1.638 m)  Wt 139 lb 4 oz (63.163 kg)  BMI 23.54 kg/m2  SpO2 96%   Physical Exam  Nursing note and vitals reviewed. Constitutional: She is oriented to person, place, and time. She appears well-developed and well-nourished. No distress.  HENT:  Head: Normocephalic and atraumatic.  Eyes: Pupils are equal, round, and reactive to light.  Neck: Normal range of motion.  Cardiovascular: Normal rate and regular rhythm.   Pulmonary/Chest: Effort normal and breath sounds normal.  Abdominal: Soft. Bowel sounds are normal. She exhibits no distension and no mass. There is no tenderness. There is no rebound and no guarding.  Neurological: She is oriented to person, place, and time. She has normal reflexes.  Psychiatric: She has a normal mood and affect. Her behavior is normal. Judgment and  thought content normal.   She does have some dizziness when she sits up from laying down on exam table but otherwise neg exam- no dix hallpike       Assessment & Plan:   Medicare annual wellness visit, initial  SVT (supraventricular tachycardia)  Essential hypertension  Hyperlipidemia No Follow-up on file.

## 2013-12-09 NOTE — Assessment & Plan Note (Addendum)
The patients weight, height, BMI and visual acuity have been recorded in the chart I have made referrals, counseling and provided education to the patient based review of the above and I have provided the pt with a written personalized care plan for preventive services.  Pneumovax today.   

## 2013-12-09 NOTE — Assessment & Plan Note (Signed)
Improved on current dose of crestor. No changes.

## 2013-12-09 NOTE — Progress Notes (Signed)
Pre visit review using our clinic review tool, if applicable. No additional management support is needed unless otherwise documented below in the visit note. 

## 2013-12-09 NOTE — Assessment & Plan Note (Signed)
Resolved.  No abrasions on exam.  ? Bleeding from varicosities but unclear why it would be bilateral and spontaneous.  Ok to resume low dose ASA but will check labs today. Orders Placed This Encounter  Procedures  . CBC with Differential  . Protime-INR  . APTT  . Comprehensive metabolic panel  . EKG 12-Lead

## 2013-12-09 NOTE — Assessment & Plan Note (Addendum)
EKG reassuring- NSR.  ?BPV Will await lab results- meclizine erx sent to take if labs all normal.  If symptoms deteriorate, she will go to UC or ER this evening. The patient indicates understanding of these issues and agrees with the plan.

## 2013-12-09 NOTE — Patient Instructions (Addendum)
Good to see you. I will call you with your lab results. If you develop any new symptoms, please call us or go straight to the ER or urgent care.  It does seem like you have vertigo. Try meclizine if your symptoms continue and if your labs are normal (I will call you)- this will make you sleepy.  Do not drive after taking it.

## 2013-12-09 NOTE — Addendum Note (Signed)
Addended by: Desmond DikeKNIGHT, [place] Verne H on: 12/09/2013 10:17 AM   Modules accepted: Orders

## 2013-12-09 NOTE — Assessment & Plan Note (Signed)
Improved with increased dose of Coreg. Reassurance provided. Advised bringing her home BP cuff to next appt.

## 2013-12-16 ENCOUNTER — Other Ambulatory Visit: Payer: Self-pay | Admitting: Cardiovascular Disease

## 2013-12-30 ENCOUNTER — Other Ambulatory Visit: Payer: Self-pay | Admitting: Cardiovascular Disease

## 2014-01-05 ENCOUNTER — Ambulatory Visit (INDEPENDENT_AMBULATORY_CARE_PROVIDER_SITE_OTHER): Payer: Medicare Other | Admitting: Internal Medicine

## 2014-01-05 ENCOUNTER — Encounter: Payer: Self-pay | Admitting: Internal Medicine

## 2014-01-05 VITALS — BP 130/80 | HR 75 | Temp 97.8°F | Wt 143.0 lb

## 2014-01-05 DIAGNOSIS — R209 Unspecified disturbances of skin sensation: Secondary | ICD-10-CM | POA: Diagnosis not present

## 2014-01-05 DIAGNOSIS — R200 Anesthesia of skin: Secondary | ICD-10-CM | POA: Insufficient documentation

## 2014-01-05 LAB — CBC WITH DIFFERENTIAL/PLATELET
BASOS PCT: 0.4 % (ref 0.0–3.0)
Basophils Absolute: 0 10*3/uL (ref 0.0–0.1)
Eosinophils Absolute: 0.1 10*3/uL (ref 0.0–0.7)
Eosinophils Relative: 1.8 % (ref 0.0–5.0)
HEMATOCRIT: 40.3 % (ref 36.0–46.0)
HEMOGLOBIN: 13.5 g/dL (ref 12.0–15.0)
LYMPHS ABS: 1.8 10*3/uL (ref 0.7–4.0)
LYMPHS PCT: 26.7 % (ref 12.0–46.0)
MCHC: 33.5 g/dL (ref 30.0–36.0)
MCV: 93.8 fl (ref 78.0–100.0)
Monocytes Absolute: 0.8 10*3/uL (ref 0.1–1.0)
Monocytes Relative: 11.3 % (ref 3.0–12.0)
NEUTROS ABS: 4.1 10*3/uL (ref 1.4–7.7)
Neutrophils Relative %: 59.8 % (ref 43.0–77.0)
Platelets: 281 10*3/uL (ref 150.0–400.0)
RBC: 4.3 Mil/uL (ref 3.87–5.11)
RDW: 13.4 % (ref 11.5–15.5)
WBC: 6.9 10*3/uL (ref 4.0–10.5)

## 2014-01-05 LAB — COMPREHENSIVE METABOLIC PANEL
ALT: 19 U/L (ref 0–35)
AST: 27 U/L (ref 0–37)
Albumin: 4.2 g/dL (ref 3.5–5.2)
Alkaline Phosphatase: 64 U/L (ref 39–117)
BUN: 18 mg/dL (ref 6–23)
CO2: 29 meq/L (ref 19–32)
CREATININE: 0.8 mg/dL (ref 0.4–1.2)
Calcium: 9.8 mg/dL (ref 8.4–10.5)
Chloride: 102 mEq/L (ref 96–112)
GFR: 79.64 mL/min (ref >60.00)
Glucose, Bld: 95 mg/dL (ref 70–99)
Potassium: 4.5 mEq/L (ref 3.5–5.1)
Sodium: 140 mEq/L (ref 135–145)
Total Bilirubin: 0.9 mg/dL (ref 0.2–1.2)
Total Protein: 7.4 g/dL (ref 6.0–8.3)

## 2014-01-05 LAB — T4, FREE: Free T4: 0.86 ng/dL (ref 0.60–1.60)

## 2014-01-05 NOTE — Progress Notes (Signed)
Subjective:    Patient ID: Ashley Hill, female    DOB: 03-28-1935, 79 y.o.   MRN: [id number]  HPI Here with daughter [name]  Had episode last week when in car driving to [place] left side went numb--face down arms and legs  No headache No vision changes No speech changes No facial droop Gradually improved after about total of 2 hours  Stayed in car till she was better No weakness  Recurred yesterday--but only part of left arm and part of face Lasted most of the day yesterday  Checks BP daily 154/80, 164/82, 162/71 Has had some dizziness in past---nothing recently  Gets some chest pain at times--just over ~left T4-5 Not necessarily exertional---self limited No SOB No diaphoresis or nausea No localized tenderness  Current Outpatient Prescriptions on File Prior to Visit  Medication Sig Dispense Refill  . aspirin 81 MG tablet Take 81 mg by mouth daily.      Marland Kitchen b complex vitamins tablet Take 1 tablet by mouth daily.      . Calcium Carb-Cholecalciferol (CALCIUM + D3) 600-200 MG-UNIT TABS Take by mouth 2 (two) times daily.      . Coenzyme Q10 200 MG capsule Take 200 mg by mouth daily.      . CRESTOR 5 MG tablet TAKE 1 TABLET BY MOUTH EVERY DAY  30 tablet  3  . hydrochlorothiazide (HYDRODIURIL) 25 MG tablet Take 1 tablet (25 mg total) by mouth daily.  30 tablet  6  . irbesartan (AVAPRO) 150 MG tablet TAKE 1 TABLET BY MOUTH EVERY DAY  30 tablet  3  . meclizine (ANTIVERT) 12.5 MG tablet Take 1 tablet (12.5 mg total) by mouth 3 (three) times daily as needed for dizziness.  30 tablet  0  . naproxen (NAPROSYN) 250 MG tablet Take 250 mg by mouth as needed.       . Omega-3 Fatty Acids (ULTRA OMEGA-3 FISH OIL PO) Take 1,200 mg by mouth daily.        No current facility-administered medications on file prior to visit.    Allergies  Allergen Reactions  . Penicillins   . Statins     Past Medical History  Diagnosis Date  . Dyslipidemia   . HOH (hard of hearing)   . Heart  murmur   . H/O scarlet fever   . Hypertension   . 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 Harvie Bridge, [place].  . Ectopic atrial tachycardia   . Hyperlipidemia     Past Surgical History  Procedure Laterality Date  . Tonsillectomy    . Vaginal hysterectomy    . Knee arthroscopy w/ meniscal repair      right  . Total knee arthroplasty  2015    Family History  Problem Relation Age of Onset  . Alcohol abuse Father   . Cancer Father     History   Social History  . Marital Status: Married    Spouse Name: N/A    Number of Children: N/A  . Years of Education: N/A   Occupational History  . Not on file.   Social History Main Topics  . Smoking status: Former Smoker -- 1.00 packs/day for 10 years    Types: Cigarettes  . Smokeless tobacco: Never Used  . Alcohol Use: No  . Drug Use: No  . Sexual Activity: No   Other Topics Concern  . Not on file   Social History Narrative  Does not have a living will.    Wants daughter to be HPOA.   Does want CPR but no prolonged life support.   Review of Systems Hasn't taken her  aspirin the past couple of days Took  aspirin early this am though     Objective:   Physical Exam  Constitutional: She is oriented to person, place, and time. She appears well-developed and well-nourished. No distress.  HENT:  Mouth/Throat: Oropharynx is clear and moist. No oropharyngeal exudate.  Eyes: EOM are normal. Pupils are equal, round, and reactive to light.  Neck: Normal range of motion. Neck supple. No thyromegaly present.  No carotid bruits  Cardiovascular: Normal rate, regular rhythm and normal heart sounds.  Exam reveals no gallop.   No murmur heard. Pulmonary/Chest: Effort normal and breath sounds normal. No respiratory distress. She has no wheezes. She has no rales.  Abdominal: Soft. There is no tenderness.  Musculoskeletal: She exhibits no edema and no tenderness.    Lymphadenopathy:    She has no cervical adenopathy.  Neurological: She is alert and oriented to person, place, and time. She has normal strength. No cranial nerve deficit or sensory deficit. She exhibits normal muscle tone. She displays a negative Romberg sign. Coordination and gait normal.  Felt unstable with Romberg but didn't fall  Psychiatric: She has a normal mood and affect. Her behavior is normal.          Assessment & Plan:

## 2014-01-05 NOTE — Progress Notes (Signed)
Pre visit review using our clinic review tool, if applicable. No additional management support is needed unless otherwise documented below in the visit note. 

## 2014-01-05 NOTE — Assessment & Plan Note (Signed)
Symptoms suggestive of TIA---probably posterior circulation with ipsilateral face and extremity symptoms Will check MRI of brain Check carotids just in case BP okay now On statin Continue the  aspirin Has cardiology follow up coming up also

## 2014-01-06 ENCOUNTER — Ambulatory Visit: Payer: Medicare Other | Admitting: Cardiovascular Disease

## 2014-01-06 ENCOUNTER — Encounter: Payer: Self-pay | Admitting: *Deleted

## 2014-01-08 ENCOUNTER — Encounter: Payer: Self-pay | Admitting: Cardiovascular Disease

## 2014-01-08 ENCOUNTER — Ambulatory Visit (INDEPENDENT_AMBULATORY_CARE_PROVIDER_SITE_OTHER): Payer: Medicare Other | Admitting: Cardiovascular Disease

## 2014-01-08 VITALS — BP 138/80 | HR 83 | Ht 64.0 in | Wt 141.8 lb

## 2014-01-08 DIAGNOSIS — I471 Supraventricular tachycardia: Secondary | ICD-10-CM

## 2014-01-08 DIAGNOSIS — G459 Transient cerebral ischemic attack, unspecified: Secondary | ICD-10-CM

## 2014-01-08 DIAGNOSIS — R079 Chest pain, unspecified: Secondary | ICD-10-CM | POA: Diagnosis not present

## 2014-01-08 DIAGNOSIS — I498 Other specified cardiac arrhythmias: Secondary | ICD-10-CM | POA: Diagnosis not present

## 2014-01-08 DIAGNOSIS — R209 Unspecified disturbances of skin sensation: Secondary | ICD-10-CM

## 2014-01-08 DIAGNOSIS — I10 Essential (primary) hypertension: Secondary | ICD-10-CM

## 2014-01-08 DIAGNOSIS — R58 Hemorrhage, not elsewhere classified: Secondary | ICD-10-CM

## 2014-01-08 DIAGNOSIS — R200 Anesthesia of skin: Secondary | ICD-10-CM

## 2014-01-08 NOTE — Assessment & Plan Note (Signed)
No recurrent arrhythmia since ablation.

## 2014-01-08 NOTE — Assessment & Plan Note (Signed)
She is going to have a brain MRI. I requested carotid Doppler.

## 2014-01-08 NOTE — Patient Instructions (Signed)
Your physician has requested that you have a carotid duplex. This test is an ultrasound of the carotid arteries in your neck. It looks at blood flow through these arteries that supply the brain with blood. Allow one hour for this exam. There are no restrictions or special instructions.  Your physician wants you to follow-up in: 6 months with Dr. Kirke Corin. You will receive a reminder letter in the mail two months in advance. If you don't receive a letter, please call our office to schedule the follow-up appointment.

## 2014-01-08 NOTE — Progress Notes (Signed)
HPI  This is a 79 year old female who is here today for a followup visit  . 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 No recurrent episodes since then. She had cardiac catheterization in November of 2012 which showed no significant coronary artery disease. She also has history of hypertension with possible white coat syndrome.  she has been tolerating small dose Crestor 5 mg daily for severe hyperlipidemia.  She reports labile hypertension mostly related to stress. She had an incident recently of sudden onset of left-sided numbness without weakness or other neurologic symptoms. She is accompanied by her daughter who reports an incident last month where the patient was found with blood around her. She said that she bled from the veins in her legs. She reports occasional chest pain when she is under stress but not with physical activities.  Allergies  Allergen Reactions  . Penicillins   . Statins      Current Outpatient Prescriptions on File Prior to Visit  Medication Sig Dispense Refill  . aspirin 81 MG tablet Take 81 mg by mouth daily.      Marland Kitchen b complex vitamins tablet Take 1 tablet by mouth daily.      . Calcium Carb-Cholecalciferol (CALCIUM + D3) 600-200 MG-UNIT TABS Take by mouth 2 (two) times daily.      . Coenzyme Q10 200 MG capsule Take 200 mg by mouth daily.      . CRESTOR 5 MG tablet TAKE 1 TABLET BY MOUTH EVERY DAY  30 tablet  3  . hydrochlorothiazide (HYDRODIURIL) 25 MG tablet Take 1 tablet (25 mg total) by mouth daily.  30 tablet  6  . irbesartan (AVAPRO) 150 MG tablet TAKE 1 TABLET BY MOUTH EVERY DAY  30 tablet  3  . meclizine (ANTIVERT) 12.5 MG tablet Take 1 tablet (12.5 mg total) by mouth 3 (three) times daily as needed for dizziness.  30 tablet  0  . naproxen (NAPROSYN) 250 MG tablet Take 250 mg by mouth as needed.       . Omega-3 Fatty Acids (ULTRA OMEGA-3 FISH OIL PO) Take 1,200 mg by mouth  daily.        No current facility-administered medications on file prior to visit.     Past Medical History  Diagnosis Date  . Dyslipidemia   . HOH (hard of hearing)   . Heart murmur   . H/O scarlet fever   . Hypertension   . 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 Harvie Bridge, [place].  . Ectopic atrial tachycardia   . Hyperlipidemia      Past Surgical History  Procedure Laterality Date  . Tonsillectomy    . Vaginal hysterectomy    . Knee arthroscopy w/ meniscal repair      right  . Total knee arthroplasty  2015     Family History  Problem Relation Age of Onset  . Alcohol abuse Father   . Cancer Father      History   Social History  . Marital Status: Married    Spouse Name: N/A    Number of Children: N/A  . Years of Education: N/A   Occupational History  . Not on file.   Social History Main Topics  . Smoking status: Former Smoker -- 1.00 packs/day for 10 years    Types: Cigarettes  . Smokeless tobacco: Never Used  . Alcohol  Use: No  . Drug Use: No  . Sexual Activity: No   Other Topics Concern  . Not on file   Social History Narrative   Does not have a living will.    Wants daughter to be HPOA.   Does want CPR but no prolonged life support.      PHYSICAL EXAM   BP 138/80  Pulse 83  Ht  (1.626 m)  Wt 141 lb 12 oz (64.297 kg)  BMI 24.32 kg/m2 Constitutional: She is oriented to person, place, and time. She appears well-developed and well-nourished. No distress.  HENT: No nasal discharge.  Head: Normocephalic and atraumatic.  Eyes: Pupils are equal and round. Right eye exhibits no discharge. Left eye exhibits no discharge.  Neck: Normal range of motion. Neck supple. No JVD present. No thyromegaly present.  Cardiovascular: Normal rate, regular rhythm, normal heart sounds. Exam reveals no gallop and no friction rub. No murmur heard.  Pulmonary/Chest: Effort normal and breath  sounds normal. No stridor. No respiratory distress. She has no wheezes. She has no rales. She exhibits no tenderness.  Abdominal: Soft. Bowel sounds are normal. She exhibits no distension. There is no tenderness. There is no rebound and no guarding.  Musculoskeletal: Normal range of motion. She exhibits no edema and no tenderness.  Neurological: She is alert and oriented to person, place, and time. Coordination normal.  Skin: Skin is warm and dry. No rash noted. She is not diaphoretic. No erythema. No pallor.  Psychiatric: She has a normal mood and affect. Her behavior is normal. Judgment and thought content normal.     EKG: Normal sinus rhythm   ASSESSMENT AND PLAN

## 2014-01-08 NOTE — Assessment & Plan Note (Signed)
The story of spontaneous bleeding from both legs recently does not really add up. She showed me the picture and there was extensive amount of blood. CBC on that time was unremarkable. There was also no evidence of abrasions when she saw Dr. Dayton Martes. I am not certain how to explain this but there might be some secondary gains.

## 2014-01-08 NOTE — Assessment & Plan Note (Signed)
Blood pressure is reasonably controlled. She does not feel well when systolic blood pressure is below 130.

## 2014-01-14 ENCOUNTER — Ambulatory Visit: Payer: Self-pay | Admitting: Internal Medicine

## 2014-01-14 ENCOUNTER — Encounter: Payer: Self-pay | Admitting: Internal Medicine

## 2014-01-14 DIAGNOSIS — M899 Disorder of bone, unspecified: Secondary | ICD-10-CM | POA: Diagnosis not present

## 2014-01-14 DIAGNOSIS — R209 Unspecified disturbances of skin sensation: Secondary | ICD-10-CM | POA: Diagnosis not present

## 2014-01-14 DIAGNOSIS — R930 Abnormal findings on diagnostic imaging of skull and head, not elsewhere classified: Secondary | ICD-10-CM | POA: Diagnosis not present

## 2014-01-14 DIAGNOSIS — M949 Disorder of cartilage, unspecified: Secondary | ICD-10-CM | POA: Diagnosis not present

## 2014-01-15 ENCOUNTER — Other Ambulatory Visit: Payer: Self-pay | Admitting: Family Medicine

## 2014-01-15 ENCOUNTER — Encounter: Payer: Self-pay | Admitting: Gastroenterology

## 2014-01-15 DIAGNOSIS — M899 Disorder of bone, unspecified: Secondary | ICD-10-CM

## 2014-01-20 ENCOUNTER — Encounter (INDEPENDENT_AMBULATORY_CARE_PROVIDER_SITE_OTHER): Payer: Medicare Other

## 2014-01-20 DIAGNOSIS — G459 Transient cerebral ischemic attack, unspecified: Secondary | ICD-10-CM

## 2014-01-20 DIAGNOSIS — R209 Unspecified disturbances of skin sensation: Secondary | ICD-10-CM | POA: Diagnosis not present

## 2014-01-20 DIAGNOSIS — I6529 Occlusion and stenosis of unspecified carotid artery: Secondary | ICD-10-CM

## 2014-01-21 ENCOUNTER — Encounter: Payer: Self-pay | Admitting: Family Medicine

## 2014-01-21 ENCOUNTER — Ambulatory Visit: Payer: Self-pay | Admitting: Family Medicine

## 2014-01-21 DIAGNOSIS — M899 Disorder of bone, unspecified: Secondary | ICD-10-CM | POA: Diagnosis not present

## 2014-01-21 DIAGNOSIS — M949 Disorder of cartilage, unspecified: Secondary | ICD-10-CM | POA: Diagnosis not present

## 2014-01-21 DIAGNOSIS — G9389 Other specified disorders of brain: Secondary | ICD-10-CM | POA: Diagnosis not present

## 2014-01-21 DIAGNOSIS — R209 Unspecified disturbances of skin sensation: Secondary | ICD-10-CM | POA: Diagnosis not present

## 2014-01-21 DIAGNOSIS — I6350 Cerebral infarction due to unspecified occlusion or stenosis of unspecified cerebral artery: Secondary | ICD-10-CM | POA: Diagnosis not present

## 2014-01-22 ENCOUNTER — Telehealth: Payer: Self-pay | Admitting: *Deleted

## 2014-01-22 DIAGNOSIS — I6523 Occlusion and stenosis of bilateral carotid arteries: Secondary | ICD-10-CM

## 2014-01-22 NOTE — Telephone Encounter (Signed)
Message copied by Fransico Setters on Thu Jan 22, 2014  3:11 PM ------      Message from: Lorine Bears A      Created: Thu Jan 22, 2014  9:40 AM       Mild carotid disease which should be monitored. Recommend carotid doppler in 2 years. ------

## 2014-01-27 ENCOUNTER — Encounter: Payer: Self-pay | Admitting: *Deleted

## 2014-01-27 ENCOUNTER — Other Ambulatory Visit: Payer: Self-pay | Admitting: Family Medicine

## 2014-01-27 DIAGNOSIS — M899 Disorder of bone, unspecified: Secondary | ICD-10-CM | POA: Insufficient documentation

## 2014-02-11 DIAGNOSIS — Z96659 Presence of unspecified artificial knee joint: Secondary | ICD-10-CM | POA: Diagnosis not present

## 2014-02-11 DIAGNOSIS — M1710 Unilateral primary osteoarthritis, unspecified knee: Secondary | ICD-10-CM | POA: Diagnosis not present

## 2014-02-16 ENCOUNTER — Encounter: Payer: Self-pay | Admitting: Internal Medicine

## 2014-02-16 ENCOUNTER — Ambulatory Visit (INDEPENDENT_AMBULATORY_CARE_PROVIDER_SITE_OTHER): Payer: Self-pay | Admitting: Internal Medicine

## 2014-02-16 VITALS — BP 172/90 | HR 80 | Ht 64.0 in | Wt 148.8 lb

## 2014-02-16 DIAGNOSIS — Z1212 Encounter for screening for malignant neoplasm of rectum: Secondary | ICD-10-CM

## 2014-02-16 DIAGNOSIS — Z1211 Encounter for screening for malignant neoplasm of colon: Secondary | ICD-10-CM

## 2014-02-16 NOTE — Patient Instructions (Addendum)
You have been scheduled for a colonoscopy. Please follow written instructions given to you at your visit today.  Please pick up your prep supplies at the pharmacy. If you use inhalers (even only as needed), please bring them with you on the day of your procedure. Your physician has requested that you go to www.startemmi.com and enter the access code given to you at your visit today. This web site gives a general overview about your procedure. However, you should still follow specific instructions given to you by our office regarding your preparation for the procedure.   I appreciate the opportunity to care for you.  

## 2014-02-16 NOTE — Progress Notes (Signed)
HPI :   Pt is a 79 yo female who had a colonoscopy in 2002 at which time no polyps were noted. She recently was seen by her PCP and was advised to have a screening colonoscopy. She has not had a change in bowel habits or stools caliber and has no bloody or tarry stools. No anorexia or wt loss.   Past Medical History  Diagnosis Date  . Dyslipidemia   . HOH (hard of hearing)   . Heart murmur   . H/O scarlet fever   . Hypertension   . 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 Harvie Bridge, [place].  . Ectopic atrial tachycardia   . Hyperlipidemia   . Transient cerebral ischemia       Family History  Problem Relation Age of Onset  . Alcohol abuse Father   . Cancer Father   . Diabetes Paternal Grandfather    History  Substance Use Topics  . Smoking status: Former Smoker -- 1.00 packs/day for 10 years    Types: Cigarettes  . Smokeless tobacco: Never Used  . Alcohol Use: No   Current Outpatient Prescriptions  Medication Sig Dispense Refill  . acetaminophen (TYLENOL) 325 MG tablet Take 650 mg by mouth every 6 (six) hours as needed.      Marland Kitchen aspirin 81 MG tablet Take 81 mg by mouth daily.      Marland Kitchen b complex vitamins tablet Take 1 tablet by mouth daily.      . Calcium Carb-Cholecalciferol (CALCIUM + D3) 600-200 MG-UNIT TABS Take by mouth 2 (two) times daily.      . carvedilol (COREG) 6.25 MG tablet Take 6.25 mg by mouth 2 (two) times daily with a meal.      . Coenzyme Q10 200 MG capsule Take 200 mg by mouth daily.      . CRESTOR 5 MG tablet TAKE 1 TABLET BY MOUTH EVERY DAY  30 tablet  3  . hydrochlorothiazide (HYDRODIURIL) 25 MG tablet Take 1 tablet (25 mg total) by mouth daily.  30 tablet  6  . irbesartan (AVAPRO) 150 MG tablet TAKE 1 TABLET BY MOUTH EVERY DAY  30 tablet  3  . Omega-3 Fatty Acids (ULTRA OMEGA-3 FISH OIL PO) Take 1,200 mg by mouth daily.        No current facility-administered medications for this visit.    Allergies  Allergen Reactions  . Penicillins   . Statins      Review of Systems: All systems reviewed and negative except where noted in HPI.      Physical Exam: BP 172/90  Pulse 80  Ht 5\' 4"  (1.626 m)  Wt 148 lb 12.8 oz (67.495 kg)  BMI 25.53 kg/m2 Constitutional: Pleasant,well-developed, female in no acute distress. HEENT: Normocephalic and atraumatic. Conjunctivae are normal. No scleral icterus. Neck supple.  Cardiovascular: Normal rate, regular rhythm.  Pulmonary/chest: Effort normal and breath sounds normal. No wheezing, rales or rhonchi. Abdominal: Soft, nondistended, nontender. Bowel sounds active throughout. There are no masses palpable. No hepatomegaly. Extremities: no edema Lymphadenopathy: No cervical adenopathy noted. Neurological: Alert and oriented to person place and time. Skin: Skin is warm and dry. No rashes noted. Psychiatric: Normal mood and affect. Behavior is normal.   ASSESSMENT AND PLAN:Pt is a 79 yo female in need of CRC screening. We have discussed colonoscopy vs cologard, and pt has agreed to schedule a colonoscopy. The risks and benefits of the procedure  were discussed. The pt will contact us with available dates once she speaks to her daughter who will provide transportation.  I have seen this patiient with Lawson FiscalLori Maebry Obrien PA-C who served as a Neurosurgeonscribe.  Iva Booparl E. Gessner, MD, Clementeen GrahamFACG

## 2014-02-24 DIAGNOSIS — Z23 Encounter for immunization: Secondary | ICD-10-CM | POA: Diagnosis not present

## 2014-03-26 ENCOUNTER — Encounter: Payer: Medicare Other | Admitting: Internal Medicine

## 2014-04-06 ENCOUNTER — Other Ambulatory Visit: Payer: Self-pay

## 2014-04-06 MED ORDER — IRBESARTAN 150 MG PO TABS: 150.0000 mg | ORAL_TABLET | Freq: Every day | ORAL | Status: DC

## 2014-04-06 MED ORDER — CARVEDILOL 6.25 MG PO TABS: 6.2500 mg | ORAL_TABLET | Freq: Two times a day (BID) | ORAL | Status: DC

## 2014-04-06 MED ORDER — HYDROCHLOROTHIAZIDE 25 MG PO TABS: 25.0000 mg | ORAL_TABLET | Freq: Every day | ORAL | Status: DC

## 2014-04-06 MED ORDER — ROSUVASTATIN CALCIUM 5 MG PO TABS: 5.0000 mg | ORAL_TABLET | Freq: Every day | ORAL | Status: DC

## 2014-04-14 ENCOUNTER — Other Ambulatory Visit: Payer: Self-pay | Admitting: Cardiovascular Disease

## 2014-06-15 DIAGNOSIS — M25462 Effusion, left knee: Secondary | ICD-10-CM | POA: Diagnosis not present

## 2014-06-25 DIAGNOSIS — M7981 Nontraumatic hematoma of soft tissue: Secondary | ICD-10-CM | POA: Diagnosis not present

## 2014-07-07 ENCOUNTER — Encounter: Payer: Self-pay | Admitting: Cardiovascular Disease

## 2014-07-07 ENCOUNTER — Ambulatory Visit (INDEPENDENT_AMBULATORY_CARE_PROVIDER_SITE_OTHER): Payer: Medicare Other | Admitting: Cardiovascular Disease

## 2014-07-07 VITALS — BP 180/100 | HR 82 | Ht 65.0 in | Wt 153.5 lb

## 2014-07-07 DIAGNOSIS — E785 Hyperlipidemia, unspecified: Secondary | ICD-10-CM | POA: Diagnosis not present

## 2014-07-07 DIAGNOSIS — I471 Supraventricular tachycardia: Secondary | ICD-10-CM

## 2014-07-07 DIAGNOSIS — R079 Chest pain, unspecified: Secondary | ICD-10-CM | POA: Diagnosis not present

## 2014-07-07 DIAGNOSIS — I10 Essential (primary) hypertension: Secondary | ICD-10-CM

## 2014-07-07 MED ORDER — IRBESARTAN 300 MG PO TABS: 300.0000 mg | ORAL_TABLET | Freq: Every day | ORAL | Status: DC

## 2014-07-07 MED ORDER — CARVEDILOL 12.5 MG PO TABS: 12.5000 mg | ORAL_TABLET | Freq: Two times a day (BID) | ORAL | Status: DC

## 2014-07-07 NOTE — Assessment & Plan Note (Signed)
The blood pressure is elevated likely after she stopped taking hydrochlorothiazide. Thus, I increased the dose of carvedilol to 12.5 mg twice daily and increased Avapro to 300 mg once daily.

## 2014-07-07 NOTE — Assessment & Plan Note (Signed)
No evidence of recurrent arrhythmia. 

## 2014-07-07 NOTE — Progress Notes (Signed)
HPI  This is a 80 year old female who is here today for a followup visit  . 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 No recurrent episodes since then. She had cardiac catheterization in November of 2012 which showed no significant coronary artery disease. She also has history of hypertension with possible white coat syndrome.  she has been tolerating small dose Crestor 5 mg daily for severe hyperlipidemia.  She reports labile hypertension mostly related to stress.  She stopped taking hydrochlorothiazide due to fatigue and dizziness which resolved after stopping the medication. Blood pressure has been elevated since then.   Allergies  Allergen Reactions  . Penicillins   . Statins      Current Outpatient Prescriptions on File Prior to Visit  Medication Sig Dispense Refill  . aspirin 81 MG tablet Take 81 mg by mouth daily.    Marland Kitchen. b complex vitamins tablet Take 1 tablet by mouth daily.    . Calcium Carb-Cholecalciferol (CALCIUM + D3) 600-200 MG-UNIT TABS Take by mouth 2 (two) times daily.    . carvedilol (COREG) 6.25 MG tablet Take 1 tablet (6.25 mg total) by mouth 2 (two) times daily with a meal. 60 tablet 2  . Coenzyme Q10 200 MG capsule Take 200 mg by mouth daily.    . CRESTOR 5 MG tablet TAKE 1 TABLET BY MOUTH EVERY DAY 30 tablet 6  . irbesartan (AVAPRO) 150 MG tablet Take 1 tablet (150 mg total) by mouth daily. 30 tablet 2  . Omega-3 Fatty Acids (ULTRA OMEGA-3 FISH OIL PO) Take 1,200 mg by mouth daily.      No current facility-administered medications on file prior to visit.     Past Medical History  Diagnosis Date  . Dyslipidemia   . HOH (hard of hearing)   . Heart murmur   . H/O scarlet fever   . Hypertension   . 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  . Ectopic atrial tachycardia   . Hyperlipidemia   .  Transient cerebral ischemia      Past Surgical History  Procedure Laterality Date  . Tonsillectomy    . Vaginal hysterectomy    . Knee arthroscopy w/ meniscal repair      Right   . Total knee arthroplasty  2015  . Ablation of dysrhythmic focus       Family History  Problem Relation Age of Onset  . Alcohol abuse Father   . Cancer Father   . Diabetes Paternal Grandfather      History   Social History  . Marital Status: Married    Spouse Name: N/A  . Number of Children: N/A  . Years of Education: N/A   Occupational History  . Not on file.   Social History Main Topics  . Smoking status: Former Smoker -- 1.00 packs/day for 10 years    Types: Cigarettes  . Smokeless tobacco: Never Used  . Alcohol Use: No  . Drug Use: No  . Sexual Activity: No   Other Topics Concern  . Not on file   Social History Narrative   Does not have a living will.    Wants daughter to be HPOA.   Does want CPR but no prolonged life support.      PHYSICAL EXAM   BP 180/100 mmHg  Pulse 82  Ht 5\' 5"  (1.651 m)  Wt 153 lb 8 oz (69.627 kg)  BMI 25.54 kg/m2 Constitutional: She is oriented to person, place, and time. She appears well-developed and well-nourished. No distress.  HENT: No nasal discharge.  Head: Normocephalic and atraumatic.  Eyes: Pupils are equal and round. Right eye exhibits no discharge. Left eye exhibits no discharge.  Neck: Normal range of motion. Neck supple. No JVD present. No thyromegaly present.  Cardiovascular: Normal rate, regular rhythm, normal heart sounds. Exam reveals no gallop and no friction rub. No murmur heard.  Pulmonary/Chest: Effort normal and breath sounds normal. No stridor. No respiratory distress. She has no wheezes. She has no rales. She exhibits no tenderness.  Abdominal: Soft. Bowel sounds are normal. She exhibits no distension. There is no tenderness. There is no rebound and no guarding.  Musculoskeletal: Normal range of motion. She exhibits no  edema and no tenderness.  Neurological: She is alert and oriented to person, place, and time. Coordination normal.  Skin: Skin is warm and dry. No rash noted. She is not diaphoretic. No erythema. No pallor.  Psychiatric: She has a normal mood and affect. Her behavior is normal. Judgment and thought content normal.     EKG: Normal sinus rhythm  - occasional ectopic ventricular beat    P:QRS - 1:1, Superior P axis, Short PRi, H Rate 82 BORDERLINE RHYTHM  ASSESSMENT AND PLAN

## 2014-07-07 NOTE — Assessment & Plan Note (Signed)
She is tolerating small dose Crestor 5 mg once daily.

## 2014-07-07 NOTE — Patient Instructions (Addendum)
Your physician has recommended you make the following change in your medication:  Increase Coreg to 12.5 mg twice a day  Increase Avapro to 300 mg once daily   Your physician recommends that you schedule a follow-up appointment in:  3 months

## 2014-07-20 DIAGNOSIS — M25562 Pain in left knee: Secondary | ICD-10-CM | POA: Diagnosis not present

## 2014-07-20 DIAGNOSIS — Z96651 Presence of right artificial knee joint: Secondary | ICD-10-CM | POA: Diagnosis not present

## 2014-08-03 ENCOUNTER — Other Ambulatory Visit: Payer: Self-pay | Admitting: Cardiovascular Disease

## 2014-10-05 DIAGNOSIS — E782 Mixed hyperlipidemia: Secondary | ICD-10-CM | POA: Diagnosis not present

## 2014-10-05 DIAGNOSIS — I4719 Other supraventricular tachycardia: Secondary | ICD-10-CM | POA: Insufficient documentation

## 2014-10-05 DIAGNOSIS — I471 Supraventricular tachycardia: Secondary | ICD-10-CM | POA: Insufficient documentation

## 2014-10-05 DIAGNOSIS — I6523 Occlusion and stenosis of bilateral carotid arteries: Secondary | ICD-10-CM | POA: Diagnosis not present

## 2014-10-05 DIAGNOSIS — I10 Essential (primary) hypertension: Secondary | ICD-10-CM | POA: Diagnosis not present

## 2014-10-06 ENCOUNTER — Ambulatory Visit: Payer: Medicare Other | Admitting: Cardiovascular Disease

## 2014-10-26 ENCOUNTER — Telehealth: Payer: Self-pay | Admitting: Family Medicine

## 2014-10-26 NOTE — Telephone Encounter (Signed)
Patient has an appt the 22nd of June her daughter would like  to know if she can be seen sooner because she is in a lot of pain, and can't hardly walk, please advise Daughter [name] cell # [phone]

## 2014-10-26 NOTE — Telephone Encounter (Signed)
lmovm for daugter to return call.

## 2014-10-27 ENCOUNTER — Encounter: Payer: Self-pay | Admitting: Family Medicine

## 2014-10-27 ENCOUNTER — Other Ambulatory Visit (INDEPENDENT_AMBULATORY_CARE_PROVIDER_SITE_OTHER): Payer: Medicare Other

## 2014-10-27 ENCOUNTER — Ambulatory Visit (INDEPENDENT_AMBULATORY_CARE_PROVIDER_SITE_OTHER): Payer: Medicare Other | Admitting: Family Medicine

## 2014-10-27 VITALS — BP 130/72 | HR 80 | Ht 65.0 in | Wt 144.0 lb

## 2014-10-27 DIAGNOSIS — M353 Polymyalgia rheumatica: Secondary | ICD-10-CM

## 2014-10-27 LAB — CK: CK TOTAL: 62 U/L (ref 7–177)

## 2014-10-27 LAB — SEDIMENTATION RATE: Sed Rate: 18 mm/hr (ref 0–22)

## 2014-10-27 NOTE — Assessment & Plan Note (Signed)
Patient is history multiple years ago been diagnosed with a polymyalgia rheumatica. Patient was treated for didn't seem to resolve on its own. Patient states that this is greater than 20 years ago. Patient is having very similar presentation with pain mostly in her thighs as well as her axial skeleton. Patient is having some weakness as well as times when she does not feel getting out of the bed. We discussed with patient at great length that there is some underlying osteophytic changes of multiple joints as well. I do not feel any decreased range of motion and do not feel that further imaging is necessary but we will get laboratory values to check patient's sedimentation rate as well as we will get CK level in case that this is possibly her statin which patient has a allergy to. We discussed different activities as well as different diet changes that could be beneficial and natural vitamins supplementations. Patient would like to avoid in sterile if possible. Patient will try these different interventions and come back and see me again in 4 weeks for further evaluation and treatment.

## 2014-10-27 NOTE — Patient Instructions (Addendum)
Good to see you.  Ice 20 minutes 2 times daily. Usually after activity and before bed. Decrease crestor to 3 times a week.  I will call you with the lab results as well.  Glucosamine sulfate 1500mg  twice a day is a supplement that has been shown to help moderate to severe arthritis. Vitamin D 2000 IU daily Fish oil 2 grams daily.  Tumeric 500mg  daily Water aerobics and cycling with low resistance are the best two types of exercise for arthritis. Come back and see me in 4 weeks.

## 2014-10-27 NOTE — Progress Notes (Signed)
Pre visit review using our clinic review tool, if applicable. No additional management support is needed unless otherwise documented below in the visit note. 

## 2014-10-27 NOTE — Telephone Encounter (Signed)
Pt's daughter called back. Scheduled pt for today @ 11:15a.

## 2014-10-27 NOTE — Progress Notes (Signed)
[facility] [address] [place], [place] [address] Phone: [phone] Subjective:    I'm seeing this patient by the request  of:  Ruthe Mannan, MD   CC: Multiple joint pains and complaints  [id number] Ashley Hill is a 80 y.o. female coming in with complaint of multiple joint pains and complaints. Patient states that greater than 20 years ago she was diagnosed with a polymyalgia rheumatica and was given some treatment and likely was sterilely she states. Patient states since then she has been doing relatively well but over the course of the last several months she has had exacerbations when she feels that she is extremely tired and severe pain and most of her muscles. Patient denies that this seems to be joint related. Does not rib or any true injury. Denies any recent infections. Denies any fevers or chills or any abnormal weight loss. Patient has had some mild increasing stress recently but nothing concerning. Denies any numbness or tingling in any of the extremities. Rates the severity of pain though as bad as 9 out of 10 when it occurs. Patient still has more good days than bad days. Week unfortunately recently  Worsening slowly.     Past Medical History  Diagnosis Date  . Dyslipidemia   . HOH (hard of hearing)   . Heart murmur   . H/O scarlet fever   . Hypertension   . 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 Harvie Bridge, [place].  . Ectopic atrial tachycardia   . Hyperlipidemia   . Transient cerebral ischemia    Past Surgical History  Procedure Laterality Date  . Tonsillectomy    . Vaginal hysterectomy    . Knee arthroscopy w/ meniscal repair      Right   . Total knee arthroplasty  2015  . Ablation of dysrhythmic focus     Allergies  Allergen Reactions  . Penicillins   . Statins    Family History  Problem Relation Age of Onset  . Alcohol abuse Father   . Cancer Father   .  Diabetes Paternal Grandfather    History  Substance Use Topics  . Smoking status: Former Smoker -- 1.00 packs/day for 10 years    Types: Cigarettes  . Smokeless tobacco: Never Used  . Alcohol Use: No     Past medical history, social, surgical and family history all reviewed in electronic medical record.   Review of Systems: No headache, visual changes, nausea, vomiting, diarrhea, constipation, dizziness, abdominal pain, skin rash, fevers, chills, night sweats, weight loss, swollen lymph nodes, body aches, joint swelling, muscle aches, chest pain, shortness of breath, mood changes.   Objective Blood pressure 130/72, pulse 80, height  (1.651 m), weight 144 lb (65.318 kg), SpO2 92 %.  General: No apparent distress alert and oriented x3 mood and affect normal, dressed appropriately.  HEENT: Pupils equal, extraocular movements intact  Respiratory: Patient's speak in full sentences and does not appear short of breath  Cardiovascular: No lower extremity edema, non tender, no erythema  Skin: Warm dry intact with no signs of infection or rash on extremities or on axial skeleton.  Abdomen: Soft nontender  Neuro: Cranial nerves II through XII are intact, neurovascularly intact in all extremities with 2+ DTRs and 2+ pulses.  Lymph: No lymphadenopathy of posterior or anterior cervical chain or axillae bilaterally.  Gait normal with good balance and coordination.  MSK:  Non  tender with full range of motion and good stability and symmetric strength and tone of shoulders, elbows, wrist, hip, knee and ankles bilaterally.   mild osteophytic changes of multiple joints but no specific findings  Patient is somewhat tender to palpation mostly over the axial skeleton of the paraspinal musculature as well as thighs, buttocks and as well as shoulder girdle. No specific findings.   Impression and Recommendations:     This case required medical decision making of moderate complexity.

## 2014-11-02 ENCOUNTER — Telehealth: Payer: Self-pay | Admitting: Family Medicine

## 2014-11-02 NOTE — Telephone Encounter (Signed)
Please call patient regarding lab results at [phone]

## 2014-11-02 NOTE — Telephone Encounter (Signed)
Discussed with pt

## 2014-11-04 ENCOUNTER — Ambulatory Visit: Payer: Medicare Other | Admitting: Family Medicine

## 2014-11-04 DIAGNOSIS — I6523 Occlusion and stenosis of bilateral carotid arteries: Secondary | ICD-10-CM | POA: Diagnosis not present

## 2014-11-04 DIAGNOSIS — E782 Mixed hyperlipidemia: Secondary | ICD-10-CM | POA: Diagnosis not present

## 2014-11-04 DIAGNOSIS — I10 Essential (primary) hypertension: Secondary | ICD-10-CM | POA: Diagnosis not present

## 2014-11-04 DIAGNOSIS — M79609 Pain in unspecified limb: Secondary | ICD-10-CM | POA: Diagnosis not present

## 2014-11-12 DIAGNOSIS — R202 Paresthesia of skin: Secondary | ICD-10-CM | POA: Diagnosis not present

## 2014-11-12 DIAGNOSIS — M5136 Other intervertebral disc degeneration, lumbar region: Secondary | ICD-10-CM | POA: Diagnosis not present

## 2014-11-17 DIAGNOSIS — I70201 Unspecified atherosclerosis of native arteries of extremities, right leg: Secondary | ICD-10-CM | POA: Diagnosis not present

## 2014-11-17 DIAGNOSIS — M4806 Spinal stenosis, lumbar region: Secondary | ICD-10-CM | POA: Diagnosis not present

## 2014-11-17 DIAGNOSIS — I739 Peripheral vascular disease, unspecified: Secondary | ICD-10-CM | POA: Diagnosis not present

## 2014-12-02 ENCOUNTER — Ambulatory Visit: Payer: Medicare Other | Admitting: Family Medicine

## 2014-12-02 DIAGNOSIS — M5136 Other intervertebral disc degeneration, lumbar region: Secondary | ICD-10-CM | POA: Diagnosis not present

## 2014-12-02 DIAGNOSIS — M79604 Pain in right leg: Secondary | ICD-10-CM | POA: Diagnosis not present

## 2014-12-02 DIAGNOSIS — M79605 Pain in left leg: Secondary | ICD-10-CM | POA: Diagnosis not present

## 2014-12-03 DIAGNOSIS — M79604 Pain in right leg: Secondary | ICD-10-CM | POA: Diagnosis not present

## 2014-12-03 DIAGNOSIS — I739 Peripheral vascular disease, unspecified: Secondary | ICD-10-CM | POA: Diagnosis not present

## 2014-12-03 DIAGNOSIS — M79605 Pain in left leg: Secondary | ICD-10-CM | POA: Diagnosis not present

## 2014-12-09 DIAGNOSIS — M79604 Pain in right leg: Secondary | ICD-10-CM | POA: Diagnosis not present

## 2014-12-09 DIAGNOSIS — M5136 Other intervertebral disc degeneration, lumbar region: Secondary | ICD-10-CM | POA: Diagnosis not present

## 2014-12-09 DIAGNOSIS — M79605 Pain in left leg: Secondary | ICD-10-CM | POA: Diagnosis not present

## 2014-12-25 DIAGNOSIS — I8310 Varicose veins of unspecified lower extremity with inflammation: Secondary | ICD-10-CM | POA: Diagnosis not present

## 2014-12-25 DIAGNOSIS — I10 Essential (primary) hypertension: Secondary | ICD-10-CM | POA: Diagnosis not present

## 2014-12-25 DIAGNOSIS — R58 Hemorrhage, not elsewhere classified: Secondary | ICD-10-CM | POA: Diagnosis not present

## 2014-12-25 DIAGNOSIS — I70221 Atherosclerosis of native arteries of extremities with rest pain, right leg: Secondary | ICD-10-CM | POA: Diagnosis not present

## 2014-12-30 ENCOUNTER — Encounter: Admission: RE | Payer: Self-pay | Source: Ambulatory Visit

## 2014-12-30 ENCOUNTER — Ambulatory Visit: Admission: RE | Admit: 2014-12-30 | Payer: Medicare Other | Source: Ambulatory Visit | Admitting: Vascular Surgery

## 2014-12-30 SURGERY — LOWER EXTREMITY ANGIOGRAPHY
Anesthesia: Moderate Sedation | Laterality: Right

## 2015-01-07 ENCOUNTER — Ambulatory Visit: Admission: RE | Admit: 2015-01-07 | Payer: Medicare Other | Source: Ambulatory Visit | Admitting: Vascular Surgery

## 2015-01-07 ENCOUNTER — Encounter: Admission: RE | Payer: Self-pay | Source: Ambulatory Visit

## 2015-01-07 SURGERY — LOWER EXTREMITY ANGIOGRAPHY
Anesthesia: Moderate Sedation | Laterality: Right

## 2015-01-11 ENCOUNTER — Ambulatory Visit
Admission: RE | Admit: 2015-01-11 | Discharge: 2015-01-11 | Disposition: A | Discharge status: Home / Self Care | Payer: Medicare Other | Source: Ambulatory Visit | Attending: Vascular Surgery | Admitting: Vascular Surgery

## 2015-01-11 ENCOUNTER — Encounter
Admission: RE | Disposition: A | Discharge status: Home / Self Care | Payer: Self-pay | Source: Ambulatory Visit | Attending: Vascular Surgery

## 2015-01-11 ENCOUNTER — Encounter: Payer: Self-pay | Admitting: *Deleted

## 2015-01-11 DIAGNOSIS — I70221 Atherosclerosis of native arteries of extremities with rest pain, right leg: Secondary | ICD-10-CM | POA: Insufficient documentation

## 2015-01-11 DIAGNOSIS — Z7982 Long term (current) use of aspirin: Secondary | ICD-10-CM | POA: Diagnosis not present

## 2015-01-11 DIAGNOSIS — I10 Essential (primary) hypertension: Secondary | ICD-10-CM | POA: Insufficient documentation

## 2015-01-11 DIAGNOSIS — Z79899 Other long term (current) drug therapy: Secondary | ICD-10-CM | POA: Diagnosis not present

## 2015-01-11 HISTORY — PX: PERIPHERAL VASCULAR CATHETERIZATION: SHX172C

## 2015-01-11 HISTORY — DX: Peripheral vascular disease, unspecified: I73.9

## 2015-01-11 LAB — BASIC METABOLIC PANEL
ANION GAP: 9 (ref 5–15)
BUN: 23 mg/dL — ABNORMAL HIGH (ref 6–20)
CALCIUM: 9.5 mg/dL (ref 8.9–10.3)
CO2: 28 mmol/L (ref 22–32)
CREATININE: 0.8 mg/dL (ref 0.44–1.00)
Chloride: 101 mmol/L (ref 101–111)
GLUCOSE: 146 mg/dL — AB (ref 65–99)
Potassium: 4.1 mmol/L (ref 3.5–5.1)
Sodium: 138 mmol/L (ref 135–145)

## 2015-01-11 SURGERY — LOWER EXTREMITY ANGIOGRAPHY
Anesthesia: Moderate Sedation | Laterality: Right

## 2015-01-11 MED ORDER — CEFAZOLIN SODIUM 1-5 GM-% IV SOLN
INTRAVENOUS | Status: AC
  Filled 2015-01-11: qty 50

## 2015-01-11 MED ORDER — LIDOCAINE-EPINEPHRINE (PF) 1 %-1:200000 IJ SOLN
INTRAMUSCULAR | Status: AC
  Filled 2015-01-11: qty 30

## 2015-01-11 MED ORDER — CLINDAMYCIN PHOSPHATE 300 MG/50ML IV SOLN
INTRAVENOUS | Status: AC
  Filled 2015-01-11: qty 50

## 2015-01-11 MED ORDER — IOHEXOL 300 MG/ML  SOLN
INTRAMUSCULAR | Status: DC | PRN
  Administered 2015-01-11: 65 mL via INTRA_ARTERIAL

## 2015-01-11 MED ORDER — CLINDAMYCIN PHOSPHATE 300 MG/50ML IV SOLN
300.0000 mg | Freq: Once | INTRAVENOUS | Status: AC
  Administered 2015-01-11: 300 mg via INTRAVENOUS

## 2015-01-11 MED ORDER — MIDAZOLAM HCL 2 MG/2ML IJ SOLN
INTRAMUSCULAR | Status: DC | PRN
  Administered 2015-01-11: 3 mg via INTRAVENOUS
  Administered 2015-01-11 (×2): 1 mg via INTRAVENOUS

## 2015-01-11 MED ORDER — HEPARIN (PORCINE) IN NACL 2-0.9 UNIT/ML-% IJ SOLN
INTRAMUSCULAR | Status: AC
  Filled 2015-01-11: qty 1000

## 2015-01-11 MED ORDER — SODIUM CHLORIDE 0.9 % IV SOLN: 500.0000 mL | Freq: Once | INTRAVENOUS | Status: DC | PRN

## 2015-01-11 MED ORDER — PHENOL 1.4 % MT LIQD: 1.0000 | OROMUCOSAL | Status: DC | PRN

## 2015-01-11 MED ORDER — GUAIFENESIN-DM 100-10 MG/5ML PO SYRP: 15.0000 mL | ORAL_SOLUTION | ORAL | Status: DC | PRN

## 2015-01-11 MED ORDER — HEPARIN SODIUM (PORCINE) 1000 UNIT/ML IJ SOLN
INTRAMUSCULAR | Status: AC
  Filled 2015-01-11: qty 1

## 2015-01-11 MED ORDER — MIDAZOLAM HCL 5 MG/5ML IJ SOLN
INTRAMUSCULAR | Status: AC
  Filled 2015-01-11: qty 5

## 2015-01-11 MED ORDER — SODIUM CHLORIDE 0.9 % IJ SOLN
INTRAMUSCULAR | Status: AC
  Filled 2015-01-11: qty 15

## 2015-01-11 MED ORDER — ACETAMINOPHEN 325 MG RE SUPP: 325.0000 mg | RECTAL | Status: DC | PRN

## 2015-01-11 MED ORDER — OXYCODONE-ACETAMINOPHEN 5-325 MG PO TABS: 1.0000 | ORAL_TABLET | ORAL | Status: DC | PRN

## 2015-01-11 MED ORDER — FENTANYL CITRATE (PF) 100 MCG/2ML IJ SOLN
INTRAMUSCULAR | Status: AC
Start: 2015-01-11 — End: 2015-01-11
  Filled 2015-01-11: qty 2

## 2015-01-11 MED ORDER — ONDANSETRON HCL 4 MG/2ML IJ SOLN: 4.0000 mg | INTRAMUSCULAR | Status: DC | PRN

## 2015-01-11 MED ORDER — HEPARIN SODIUM (PORCINE) 1000 UNIT/ML IJ SOLN
INTRAMUSCULAR | Status: DC | PRN
  Administered 2015-01-11: 4000 [IU] via INTRAVENOUS

## 2015-01-11 MED ORDER — HYDROMORPHONE HCL 1 MG/ML IJ SOLN: 0.5000 mg | INTRAMUSCULAR | Status: DC | PRN

## 2015-01-11 MED ORDER — METOPROLOL TARTRATE 1 MG/ML IV SOLN: 2.0000 mg | INTRAVENOUS | Status: DC | PRN

## 2015-01-11 MED ORDER — ATROPINE SULFATE 0.1 MG/ML IJ SOLN: 0.5000 mg | Freq: Once | INTRAMUSCULAR | Status: DC | PRN

## 2015-01-11 MED ORDER — DEXTROSE 50 % IV SOLN: 0.5000 | Freq: Once | INTRAVENOUS | Status: DC | PRN

## 2015-01-11 MED ORDER — FENTANYL CITRATE (PF) 100 MCG/2ML IJ SOLN
INTRAMUSCULAR | Status: DC | PRN
  Administered 2015-01-11: 100 ug via INTRAVENOUS
  Administered 2015-01-11 (×2): 50 ug via INTRAVENOUS

## 2015-01-11 MED ORDER — HYDRALAZINE HCL 20 MG/ML IJ SOLN: 5.0000 mg | INTRAMUSCULAR | Status: DC | PRN

## 2015-01-11 MED ORDER — HYDROMORPHONE HCL 1 MG/ML IJ SOLN: 1.0000 mg | INTRAMUSCULAR | Status: DC | PRN

## 2015-01-11 MED ORDER — LIDOCAINE-EPINEPHRINE (PF) 1 %-1:200000 IJ SOLN
INTRAMUSCULAR | Status: DC | PRN
  Administered 2015-01-11: 10 mL via INTRADERMAL

## 2015-01-11 MED ORDER — LABETALOL HCL 5 MG/ML IV SOLN: 10.0000 mg | INTRAVENOUS | Status: DC | PRN

## 2015-01-11 MED ORDER — ACETAMINOPHEN 325 MG PO TABS: 325.0000 mg | ORAL_TABLET | ORAL | Status: DC | PRN

## 2015-01-11 MED ORDER — FENTANYL CITRATE (PF) 100 MCG/2ML IJ SOLN
INTRAMUSCULAR | Status: AC
  Filled 2015-01-11: qty 2

## 2015-01-11 MED ORDER — ONDANSETRON HCL 4 MG/2ML IJ SOLN: 4.0000 mg | Freq: Four times a day (QID) | INTRAMUSCULAR | Status: DC | PRN

## 2015-01-11 MED ORDER — SODIUM CHLORIDE 0.9 % IV SOLN
INTRAVENOUS | Status: DC
  Administered 2015-01-11 (×2): via INTRAVENOUS

## 2015-01-11 SURGICAL SUPPLY — 20 items
BAG DECANTER STRL (MISCELLANEOUS) ×2 IMPLANT
BALLN LUTONIX 4X150X130 (BALLOONS) ×2
BALLN LUTONIX 5X150X130 (BALLOONS) ×2
BALLN LUTONIX DCB 6X80X130 (BALLOONS) ×2
BALLOON LUTONIX 4X150X130 (BALLOONS) ×1 IMPLANT
BALLOON LUTONIX 5X150X130 (BALLOONS) ×1 IMPLANT
BALLOON LUTONIX DCB 6X80X130 (BALLOONS) ×1 IMPLANT
CATH ANGIO 5F BER2 100CM (CATHETERS) ×2 IMPLANT
CATH PIG 70CM (CATHETERS) ×2 IMPLANT
CATH VERT 100CM (CATHETERS) ×2 IMPLANT
DEVICE PRESTO INFLATION (MISCELLANEOUS) ×2 IMPLANT
DEVICE STARCLOSE SE CLOSURE (Vascular Products) ×2 IMPLANT
DEVICE TORQUE (MISCELLANEOUS) ×2 IMPLANT
GLIDEWIRE ADV .035X260CM (WIRE) ×2 IMPLANT
PACK ANGIOGRAPHY (CUSTOM PROCEDURE TRAY) ×2 IMPLANT
SHEATH ANL2 6FRX45 HC (SHEATH) ×2 IMPLANT
SHEATH BRITE TIP 5FRX11 (SHEATH) ×2 IMPLANT
SYR MEDRAD MARK V 150ML (SYRINGE) ×2 IMPLANT
TUBING CONTRAST HIGH PRESS 72 (TUBING) ×2 IMPLANT
WIRE J 3MM .035X145CM (WIRE) ×2 IMPLANT

## 2015-01-11 NOTE — Op Note (Signed)
[facility] Percutaneous Study/Intervention Procedural Note   Date of Surgery: 01/11/2015  Surgeon(s):DEW,JASON   Assistants:none  Pre-operative Diagnosis: PAD with rest pain right lower extremity  Post-operative diagnosis: Same  Procedure(s) Performed: 1. Ultrasound guidance for vascular access left femoral artery 2. Catheter placement into right peroneal artery from left femoral approach 3. Aortogram and selective right lower extremity angiogram 4. Percutaneous transluminal angioplasty of distal popliteal artery and tibioperoneal trunk with 4 mm diameter Lutonix drug-coated angioplasty balloon 5. Percutaneous transluminal angioplasty of distal SFA and above-knee popliteal artery with 5 mm diameter Lutonix drug-coated angioplasty balloon  6.  Percutaneous transluminal angioplasty of distal right external iliac artery and common femoral artery with 6 mm diameter Lutonix drug-coated angioplasty balloon 7. StarClose closure device left femoral artery  EBL: 25 cc  Indications: Patient is a 80 year old white female with ischemic rest pain of the right lower extremity. The patient has noninvasive study showing a markedly reduced right ABI. The patient is brought in for angiography for further evaluation and potential treatment. Risks and benefits are discussed and informed consent is obtained  Procedure: The patient was identified and appropriate procedural time out was performed. The patient was then placed supine on the table and prepped and draped in the usual sterile fashion. Ultrasound was used to evaluate the left common femoral artery. It was patent . A digital ultrasound image was acquired. A Seldinger needle was used to access the left common femoral artery under direct ultrasound guidance and a permanent image was performed. A 0.035 J wire was advanced without  resistance and a 5Fr sheath was placed. Pigtail catheter was placed into the aorta and an AP aortogram was performed. This demonstrated normal renal arteries and normal aorta and normal left iliac segments without significant stenosis. The distal right external iliac artery at the circumflex vessels started a significant stenosis the tract to the mid to distal common femoral artery and was greater than 80%. I then crossed the aortic bifurcation and advanced to the right femoral head and through the stenosis into the proximal superficial femoral artery to opacify distally. Selective right lower extremity angiogram was then performed. This demonstrated diffuse disease in the distal SFA and above-knee popliteal artery with the worst segment being in the 60% range. A knee prosthesis then mated difficult to opacify the popliteal artery, but with positioning maneuvers we were able to identify a distal popliteal and tibioperoneal trunk stenosis which was nearly occlusive. She then had two-vessel runoff distally.. The patient was systemically heparinized and a 6 [place] Ansell sheath was then placed over the [employer] wire. I then used a Kumpe catheter and the advantage wire to navigate through the iliac and common femoral stenosis, the SFA and popliteal stenosis, and then through the near occlusive stenosis in the distal popliteal artery and tibioperoneal trunk parking the wire in the peroneal artery. The distal popliteal artery and tibioperoneal trunk were treated with a 4 mm diameter by 15 cm length Lutonix drug-coated angioplasty balloon. This was inflated to 8 atm for 1 minute. Completion angiogram showed about a 20% residual stenosis in the popliteal artery and no significant stenosis in the tibioperoneal trunk with some spasm in the peroneal artery distally but maintained flow. I then treated the distal SFA and above-knee popliteal artery with a 5 mm diameter by 15 cm length Lutonix drug-coated angioplasty  balloon inflated to 12 atm for 1 minute. Following angioplasty, there was a nonflow limiting dissection and less than 20% residual stenosis in  the distal SFA and above-knee popliteal artery. I then turned my attention to the distal external iliac artery and common femoral artery on the right. A 6 mm diameter by 8 cm length Lutonix drug-coated angioplasty balloon was initially inflated to 6 atm with a resolution of the waist. Following the initial angioplasty for 1 minute, there were still significant residual stenosis and dissection in the common femoral artery. I retreated with that balloon and held the inflation for 3 minutes. At this point, there was a nonflow limiting dissection and about a 30-35% residual stenosis in the mid to distal common femoral artery. This did not appear flow limiting. I elected to terminate the procedure. The sheath was removed and StarClose closure device was deployed in the left femoral artery with excellent hemostatic result. The patient was taken to the recovery room in stable condition having tolerated the procedure well.  Findings:  Aortogram: Normal renal arteries, normal aorta, no left iliac stenosis. Right distal iliac and common femoral artery with significant stenosis. Right Lower Extremity: Stenosis in the common femoral artery and distal external iliac artery of greater than 80%, distal SFA stenosis of about 60%, distal popliteal and TP trunk stenosis of greater than 90%.   Disposition: Patient was taken to the recovery room in stable condition having tolerated the procedure well.  Complications: None  DEW,JASON 01/11/2015 11:22 AM

## 2015-01-11 NOTE — Discharge Instructions (Signed)

## 2015-01-11 NOTE — H&P (Signed)
  [facility] History & Physical Update  The patient was interviewed and re-examined.  The patient's previous History and Physical has been reviewed and is unchanged.  There is no change in the plan of care. We plan to proceed with the scheduled procedure.  DEW,JASON, MD  01/11/2015, 11:07 AM

## 2015-01-12 ENCOUNTER — Encounter: Payer: Self-pay | Admitting: Vascular Surgery

## 2015-02-01 DIAGNOSIS — I70221 Atherosclerosis of native arteries of extremities with rest pain, right leg: Secondary | ICD-10-CM | POA: Diagnosis not present

## 2015-02-01 DIAGNOSIS — I739 Peripheral vascular disease, unspecified: Secondary | ICD-10-CM | POA: Diagnosis not present

## 2015-02-01 DIAGNOSIS — I70212 Atherosclerosis of native arteries of extremities with intermittent claudication, left leg: Secondary | ICD-10-CM | POA: Diagnosis not present

## 2015-02-01 DIAGNOSIS — E785 Hyperlipidemia, unspecified: Secondary | ICD-10-CM | POA: Diagnosis not present

## 2015-02-01 DIAGNOSIS — I10 Essential (primary) hypertension: Secondary | ICD-10-CM | POA: Diagnosis not present

## 2015-03-03 ENCOUNTER — Other Ambulatory Visit: Payer: Self-pay | Admitting: Cardiovascular Disease

## 2015-03-04 ENCOUNTER — Telehealth: Payer: Self-pay | Admitting: *Deleted

## 2015-03-04 NOTE — Telephone Encounter (Signed)
lmov for pt needs to make a OD [id number] [id number].  She called for refills and we will need to see her in order to do so

## 2015-03-07 ENCOUNTER — Other Ambulatory Visit: Payer: Self-pay | Admitting: Cardiovascular Disease

## 2015-03-07 DIAGNOSIS — Z23 Encounter for immunization: Secondary | ICD-10-CM | POA: Diagnosis not present

## 2015-03-10 ENCOUNTER — Telehealth: Payer: Self-pay | Admitting: *Deleted

## 2015-03-10 DIAGNOSIS — H2513 Age-related nuclear cataract, bilateral: Secondary | ICD-10-CM | POA: Diagnosis not present

## 2015-03-10 DIAGNOSIS — H43392 Other vitreous opacities, left eye: Secondary | ICD-10-CM | POA: Diagnosis not present

## 2015-03-10 DIAGNOSIS — H5203 Hypermetropia, bilateral: Secondary | ICD-10-CM | POA: Diagnosis not present

## 2015-03-10 DIAGNOSIS — I10 Essential (primary) hypertension: Secondary | ICD-10-CM | POA: Diagnosis not present

## 2015-03-10 DIAGNOSIS — H25013 Cortical age-related cataract, bilateral: Secondary | ICD-10-CM | POA: Diagnosis not present

## 2015-03-10 DIAGNOSIS — H52223 Regular astigmatism, bilateral: Secondary | ICD-10-CM | POA: Diagnosis not present

## 2015-03-10 NOTE — Telephone Encounter (Signed)
lmov for pt to schedule apt for she is over due to see us and pt needs refill.

## 2015-03-12 DIAGNOSIS — H2513 Age-related nuclear cataract, bilateral: Secondary | ICD-10-CM | POA: Diagnosis not present

## 2015-03-12 DIAGNOSIS — I10 Essential (primary) hypertension: Secondary | ICD-10-CM | POA: Diagnosis not present

## 2015-03-12 DIAGNOSIS — H18413 Arcus senilis, bilateral: Secondary | ICD-10-CM | POA: Diagnosis not present

## 2015-03-12 DIAGNOSIS — H02839 Dermatochalasis of unspecified eye, unspecified eyelid: Secondary | ICD-10-CM | POA: Diagnosis not present

## 2015-03-25 DIAGNOSIS — H2511 Age-related nuclear cataract, right eye: Secondary | ICD-10-CM | POA: Diagnosis not present

## 2015-04-03 ENCOUNTER — Other Ambulatory Visit: Payer: Self-pay | Admitting: Cardiovascular Disease

## 2015-04-05 ENCOUNTER — Other Ambulatory Visit: Payer: Self-pay | Admitting: Cardiovascular Disease

## 2015-04-18 ENCOUNTER — Other Ambulatory Visit: Payer: Self-pay | Admitting: Cardiovascular Disease

## 2015-04-26 DIAGNOSIS — H43392 Other vitreous opacities, left eye: Secondary | ICD-10-CM | POA: Diagnosis not present

## 2015-04-26 DIAGNOSIS — H5211 Myopia, right eye: Secondary | ICD-10-CM | POA: Diagnosis not present

## 2015-04-26 DIAGNOSIS — I10 Essential (primary) hypertension: Secondary | ICD-10-CM | POA: Diagnosis not present

## 2015-04-26 DIAGNOSIS — H25811 Combined forms of age-related cataract, right eye: Secondary | ICD-10-CM | POA: Diagnosis not present

## 2015-04-26 DIAGNOSIS — H25812 Combined forms of age-related cataract, left eye: Secondary | ICD-10-CM | POA: Diagnosis not present

## 2015-04-26 DIAGNOSIS — H52221 Regular astigmatism, right eye: Secondary | ICD-10-CM | POA: Diagnosis not present

## 2015-04-26 DIAGNOSIS — Z961 Presence of intraocular lens: Secondary | ICD-10-CM | POA: Diagnosis not present

## 2015-04-26 DIAGNOSIS — H2511 Age-related nuclear cataract, right eye: Secondary | ICD-10-CM | POA: Diagnosis not present

## 2015-04-27 DIAGNOSIS — H2512 Age-related nuclear cataract, left eye: Secondary | ICD-10-CM | POA: Diagnosis not present

## 2015-05-03 ENCOUNTER — Other Ambulatory Visit: Payer: Self-pay | Admitting: Cardiovascular Disease

## 2015-06-01 DIAGNOSIS — I10 Essential (primary) hypertension: Secondary | ICD-10-CM | POA: Diagnosis not present

## 2015-06-01 DIAGNOSIS — I739 Peripheral vascular disease, unspecified: Secondary | ICD-10-CM | POA: Diagnosis not present

## 2015-06-01 DIAGNOSIS — I70221 Atherosclerosis of native arteries of extremities with rest pain, right leg: Secondary | ICD-10-CM | POA: Diagnosis not present

## 2015-06-01 DIAGNOSIS — E785 Hyperlipidemia, unspecified: Secondary | ICD-10-CM | POA: Diagnosis not present

## 2015-06-01 DIAGNOSIS — I70212 Atherosclerosis of native arteries of extremities with intermittent claudication, left leg: Secondary | ICD-10-CM | POA: Diagnosis not present

## 2015-06-14 ENCOUNTER — Other Ambulatory Visit: Payer: Self-pay | Admitting: Cardiovascular Disease

## 2015-06-15 DIAGNOSIS — I70221 Atherosclerosis of native arteries of extremities with rest pain, right leg: Secondary | ICD-10-CM | POA: Diagnosis not present

## 2015-06-15 DIAGNOSIS — I70212 Atherosclerosis of native arteries of extremities with intermittent claudication, left leg: Secondary | ICD-10-CM | POA: Diagnosis not present

## 2015-06-15 DIAGNOSIS — I10 Essential (primary) hypertension: Secondary | ICD-10-CM | POA: Diagnosis not present

## 2015-06-15 DIAGNOSIS — E785 Hyperlipidemia, unspecified: Secondary | ICD-10-CM | POA: Diagnosis not present

## 2015-06-15 DIAGNOSIS — I739 Peripheral vascular disease, unspecified: Secondary | ICD-10-CM | POA: Diagnosis not present

## 2015-06-16 ENCOUNTER — Other Ambulatory Visit: Payer: Self-pay | Admitting: Vascular Surgery

## 2015-06-21 ENCOUNTER — Encounter
Admission: RE | Disposition: A | Discharge status: Home / Self Care | Payer: Self-pay | Source: Ambulatory Visit | Attending: Vascular Surgery

## 2015-06-21 ENCOUNTER — Encounter: Payer: Self-pay | Admitting: Vascular Surgery

## 2015-06-21 ENCOUNTER — Ambulatory Visit
Admission: RE | Admit: 2015-06-21 | Discharge: 2015-06-21 | Disposition: A | Discharge status: Home / Self Care | Payer: Medicare Other | Source: Ambulatory Visit | Attending: Vascular Surgery | Admitting: Vascular Surgery

## 2015-06-21 DIAGNOSIS — I8310 Varicose veins of unspecified lower extremity with inflammation: Secondary | ICD-10-CM | POA: Insufficient documentation

## 2015-06-21 DIAGNOSIS — I70212 Atherosclerosis of native arteries of extremities with intermittent claudication, left leg: Secondary | ICD-10-CM | POA: Diagnosis not present

## 2015-06-21 DIAGNOSIS — E785 Hyperlipidemia, unspecified: Secondary | ICD-10-CM | POA: Insufficient documentation

## 2015-06-21 DIAGNOSIS — I70221 Atherosclerosis of native arteries of extremities with rest pain, right leg: Secondary | ICD-10-CM | POA: Insufficient documentation

## 2015-06-21 DIAGNOSIS — I70211 Atherosclerosis of native arteries of extremities with intermittent claudication, right leg: Secondary | ICD-10-CM | POA: Diagnosis not present

## 2015-06-21 DIAGNOSIS — Z7902 Long term (current) use of antithrombotics/antiplatelets: Secondary | ICD-10-CM | POA: Insufficient documentation

## 2015-06-21 DIAGNOSIS — Z79899 Other long term (current) drug therapy: Secondary | ICD-10-CM | POA: Insufficient documentation

## 2015-06-21 DIAGNOSIS — I10 Essential (primary) hypertension: Secondary | ICD-10-CM | POA: Diagnosis not present

## 2015-06-21 DIAGNOSIS — I739 Peripheral vascular disease, unspecified: Secondary | ICD-10-CM | POA: Diagnosis not present

## 2015-06-21 HISTORY — PX: PERIPHERAL VASCULAR CATHETERIZATION: SHX172C

## 2015-06-21 LAB — CREATININE, SERUM: Creatinine, Ser: 0.61 mg/dL (ref 0.44–1.00)

## 2015-06-21 LAB — BUN: BUN: 24 mg/dL — ABNORMAL HIGH (ref 6–20)

## 2015-06-21 SURGERY — LOWER EXTREMITY ANGIOGRAPHY
Anesthesia: Moderate Sedation | Laterality: Right

## 2015-06-21 MED ORDER — IOHEXOL 300 MG/ML  SOLN
INTRAMUSCULAR | Status: DC | PRN
  Administered 2015-06-21: 120 mL via INTRA_ARTERIAL

## 2015-06-21 MED ORDER — CLOPIDOGREL BISULFATE 75 MG PO TABS: 75.0000 mg | ORAL_TABLET | Freq: Every day | ORAL | Status: DC

## 2015-06-21 MED ORDER — SODIUM CHLORIDE 0.9 % IV SOLN
INTRAVENOUS | Status: DC
  Administered 2015-06-21: 08:00:00 via INTRAVENOUS

## 2015-06-21 MED ORDER — FENTANYL CITRATE (PF) 100 MCG/2ML IJ SOLN
INTRAMUSCULAR | Status: AC
  Filled 2015-06-21: qty 2

## 2015-06-21 MED ORDER — ACETAMINOPHEN 325 MG RE SUPP
325.0000 mg | RECTAL | Status: DC | PRN
  Filled 2015-06-21: qty 2

## 2015-06-21 MED ORDER — MIDAZOLAM HCL 2 MG/2ML IJ SOLN
INTRAMUSCULAR | Status: AC
  Filled 2015-06-21: qty 2

## 2015-06-21 MED ORDER — HYDRALAZINE HCL 20 MG/ML IJ SOLN: 5.0000 mg | INTRAMUSCULAR | Status: DC | PRN

## 2015-06-21 MED ORDER — ONDANSETRON HCL 4 MG/2ML IJ SOLN: 4.0000 mg | Freq: Four times a day (QID) | INTRAMUSCULAR | Status: DC | PRN

## 2015-06-21 MED ORDER — METHYLPREDNISOLONE SODIUM SUCC 125 MG IJ SOLR: 125.0000 mg | INTRAMUSCULAR | Status: DC

## 2015-06-21 MED ORDER — LABETALOL HCL 5 MG/ML IV SOLN: 10.0000 mg | INTRAVENOUS | Status: DC | PRN

## 2015-06-21 MED ORDER — MIDAZOLAM HCL 5 MG/5ML IJ SOLN
INTRAMUSCULAR | Status: AC
  Filled 2015-06-21: qty 5

## 2015-06-21 MED ORDER — HYDROMORPHONE HCL 1 MG/ML IJ SOLN
1.0000 mg | Freq: Once | INTRAMUSCULAR | Status: AC
Start: 2015-06-21 — End: 2015-06-21
  Administered 2015-06-21: 1 mg via INTRAVENOUS

## 2015-06-21 MED ORDER — DIPHENHYDRAMINE HCL 50 MG/ML IJ SOLN: 25.0000 mg | INTRAMUSCULAR | Status: DC

## 2015-06-21 MED ORDER — MIDAZOLAM HCL 2 MG/2ML IJ SOLN
INTRAMUSCULAR | Status: DC | PRN
  Administered 2015-06-21: 1 mg via INTRAVENOUS
  Administered 2015-06-21: 2 mg via INTRAVENOUS
  Administered 2015-06-21 (×3): 1 mg via INTRAVENOUS

## 2015-06-21 MED ORDER — CLINDAMYCIN PHOSPHATE 300 MG/50ML IV SOLN
INTRAVENOUS | Status: AC
  Administered 2015-06-21: 09:00:00
  Filled 2015-06-21: qty 50

## 2015-06-21 MED ORDER — HYDROMORPHONE HCL 1 MG/ML IJ SOLN
INTRAMUSCULAR | Status: AC
  Administered 2015-06-21: 1 mg via INTRAVENOUS
  Filled 2015-06-21: qty 1

## 2015-06-21 MED ORDER — GUAIFENESIN-DM 100-10 MG/5ML PO SYRP
15.0000 mL | ORAL_SOLUTION | ORAL | Status: DC | PRN
  Filled 2015-06-21: qty 15

## 2015-06-21 MED ORDER — PHENOL 1.4 % MT LIQD: 1.0000 | OROMUCOSAL | Status: DC | PRN

## 2015-06-21 MED ORDER — CLINDAMYCIN PHOSPHATE 300 MG/50ML IV SOLN: 300.0000 mg | Freq: Once | INTRAVENOUS | Status: DC

## 2015-06-21 MED ORDER — METOPROLOL TARTRATE 1 MG/ML IV SOLN: 2.0000 mg | INTRAVENOUS | Status: DC | PRN

## 2015-06-21 MED ORDER — LIDOCAINE HCL (PF) 1 % IJ SOLN
INTRAMUSCULAR | Status: AC
  Filled 2015-06-21: qty 5

## 2015-06-21 MED ORDER — FAMOTIDINE IN NACL 20-0.9 MG/50ML-% IV SOLN
20.0000 mg | INTRAVENOUS | Status: DC
  Filled 2015-06-21: qty 50

## 2015-06-21 MED ORDER — SODIUM CHLORIDE 0.9 % IV SOLN: 500.0000 mL | Freq: Once | INTRAVENOUS | Status: DC | PRN

## 2015-06-21 MED ORDER — FENTANYL CITRATE (PF) 100 MCG/2ML IJ SOLN
INTRAMUSCULAR | Status: DC | PRN
  Administered 2015-06-21 (×5): 50 ug via INTRAVENOUS

## 2015-06-21 MED ORDER — HEPARIN SODIUM (PORCINE) 1000 UNIT/ML IJ SOLN
INTRAMUSCULAR | Status: AC
  Filled 2015-06-21: qty 1

## 2015-06-21 MED ORDER — HEPARIN SODIUM (PORCINE) 1000 UNIT/ML IJ SOLN
INTRAMUSCULAR | Status: DC | PRN
Start: 2015-06-21 — End: 2015-06-21
  Administered 2015-06-21: 5000 [IU] via INTRAVENOUS

## 2015-06-21 MED ORDER — ACETAMINOPHEN 325 MG PO TABS: 325.0000 mg | ORAL_TABLET | ORAL | Status: DC | PRN

## 2015-06-21 MED ORDER — HYDROMORPHONE HCL 1 MG/ML IJ SOLN: 0.5000 mg | INTRAMUSCULAR | Status: DC | PRN

## 2015-06-21 MED ORDER — SODIUM CHLORIDE FLUSH 0.9 % IV SOLN
INTRAVENOUS | Status: AC
  Filled 2015-06-21: qty 50

## 2015-06-21 MED ORDER — LIDOCAINE-EPINEPHRINE (PF) 1 %-1:200000 IJ SOLN
INTRAMUSCULAR | Status: DC | PRN
  Administered 2015-06-21: 10 mL via INTRADERMAL

## 2015-06-21 MED ORDER — OXYCODONE-ACETAMINOPHEN 5-325 MG PO TABS: 1.0000 | ORAL_TABLET | ORAL | Status: DC | PRN

## 2015-06-21 MED ORDER — HEPARIN (PORCINE) IN NACL 2-0.9 UNIT/ML-% IJ SOLN
INTRAMUSCULAR | Status: AC
  Filled 2015-06-21: qty 1000

## 2015-06-21 SURGICAL SUPPLY — 23 items
BALLN LUTONIX 4X150X130 (BALLOONS) ×2
BALLN LUTONIX 5X150X130 (BALLOONS) ×4
BALLN ULTRVRSE 2.5X300X150 (BALLOONS) ×2
BALLN ULTRVRSE 5X22X130 (BALLOONS) ×2
BALLOON LUTONIX 4X150X130 (BALLOONS) ×1 IMPLANT
BALLOON LUTONIX 5X150X130 (BALLOONS) ×2 IMPLANT
BALLOON ULTRVRSE 2.5X300X150 (BALLOONS) ×1 IMPLANT
BALLOON ULTRVRSE 5X22X130 (BALLOONS) ×1 IMPLANT
CATH CXI SUPP ANG 4FR 135 (MICROCATHETER) ×1 IMPLANT
CATH CXI SUPP ANG 4FR 135CM (MICROCATHETER) ×2
CATH PIG 70CM (CATHETERS) ×2 IMPLANT
CATH VERT 100CM (CATHETERS) ×2 IMPLANT
DEVICE PRESTO INFLATION (MISCELLANEOUS) ×2 IMPLANT
DEVICE STARCLOSE SE CLOSURE (Vascular Products) ×2 IMPLANT
GLIDEWIRE ADV .035X260CM (WIRE) ×2 IMPLANT
PACK ANGIOGRAPHY (CUSTOM PROCEDURE TRAY) ×2 IMPLANT
SHEATH ANL2 6FRX45 HC (SHEATH) ×2 IMPLANT
SHEATH BRITE TIP 5FRX11 (SHEATH) ×2 IMPLANT
STENT VIABAHN 6X250X120 (Permanent Stent) ×2 IMPLANT
SYR MEDRAD MARK V 150ML (SYRINGE) ×2 IMPLANT
TUBING CONTRAST HIGH PRESS 72 (TUBING) ×2 IMPLANT
WIRE G V18X300CM (WIRE) ×2 IMPLANT
WIRE J 3MM .035X145CM (WIRE) ×2 IMPLANT

## 2015-06-21 NOTE — H&P (Signed)
  [facility] History & Physical Update  The patient was interviewed and re-examined.  The patient's previous History and Physical has been reviewed and is unchanged.  There is no change in the plan of care. We plan to proceed with the scheduled procedure.  DEW,JASON, MD  06/21/2015, 8:18 AM

## 2015-06-21 NOTE — Discharge Instructions (Signed)
Angiogram, Care After °Refer to this sheet in the next few weeks. These instructions provide you with information about caring for yourself after your procedure. Your health care provider may also give you more specific instructions. Your treatment has been planned according to current medical practices, but problems sometimes occur. Call your health care provider if you have any problems or questions after your procedure. °WHAT TO EXPECT AFTER THE PROCEDURE °After your procedure, it is typical to have the following: °· Bruising at the catheter insertion site that usually fades within 1-2 weeks. °· Blood collecting in the tissue (hematoma) that may be painful to the touch. It should usually decrease in size and tenderness within 1-2 weeks. °HOME CARE INSTRUCTIONS °· Take medicines only as directed by your health care provider. °· You may shower 24-48 hours after the procedure or as directed by your health care provider. Remove the bandage (dressing) and gently wash the site with plain soap and water. Pat the area dry with a clean towel. Do not rub the site, because this may cause bleeding. °· Do not take baths, swim, or use a hot tub until your health care provider approves. °· Check your insertion site every day for redness, swelling, or drainage. °· Do not apply powder or lotion to the site. °· Do not lift over 10 lb (4.5 kg) for 5 days after your procedure or as directed by your health care provider. °· Ask your health care provider when it is okay to: °¨ Return to work or school. °¨ Resume usual physical activities or sports. °¨ Resume sexual activity. °· Do not drive home if you are discharged the same day as the procedure. Have someone else drive you. °· You may drive 24 hours after the procedure unless otherwise instructed by your health care provider. °· Do not operate machinery or power tools for 24 hours after the procedure or as directed by your health care provider. °· If your procedure was done as an  outpatient procedure, which means that you went home the same day as your procedure, a responsible adult should be with you for the first 24 hours after you arrive home. °· Keep all follow-up visits as directed by your health care provider. This is important. °SEEK MEDICAL CARE IF: °· You have a fever. °· You have chills. °· You have increased bleeding from the catheter insertion site. Hold pressure on the site. °SEEK IMMEDIATE MEDICAL CARE IF: °· You have unusual pain at the catheter insertion site. °· You have redness, warmth, or swelling at the catheter insertion site. °· You have drainage (other than a small amount of blood on the dressing) from the catheter insertion site. °· The catheter insertion site is bleeding, and the bleeding does not stop after 30 minutes of holding steady pressure on the site. °· The area near or just beyond the catheter insertion site becomes pale, cool, tingly, or numb. °  °This information is not intended to replace advice given to you by your health care provider. Make sure you discuss any questions you have with your health care provider. °  °Document Released: 11/17/2004 Document Revised: 05/22/2014 Document Reviewed: 10/02/2012 °Elsevier Interactive Patient Education ©2016 [employer] ° °

## 2015-06-21 NOTE — Op Note (Signed)
[facility] Percutaneous Study/Intervention Procedural Note   Date of Surgery: 06/21/2015  Surgeon(s):Toluwani Yadav   Assistants:none  Pre-operative Diagnosis: PAD with rest pain right lower extremity  Post-operative diagnosis: Same  Procedure(s) Performed: 1. Ultrasound guidance for vascular access left femoral artery 2. Catheter placement into right peroneal artery from left femoral approach 3. Aortogram and selective right lower extremity angiogram 4. Percutaneous transluminal angioplasty of right peroneal artery and tibioperoneal trunk with 2.5 mm diameter by 30 cm length angioplasty balloon 5. Percutaneous transluminal angioplasty of the below-knee popliteal artery with 4 mm diameter Lutonix balloon, mid and distal superficial femoral artery and above-knee popliteal artery with 5 mm diameter Lutonix balloon, and percutaneous transluminal angioplasty of the common femoral artery and proximal superficial femoral artery with 5 mm diameter Lutonix angioplasty balloon  6.  Viabahn stent placement to the superficial femoral artery throughout much of its course with 6 mm diameter by 25 cm length stent for multiple areas of greater than 50% residual stenosis and dissection after angioplasty 7. StarClose closure device left femoral artery  EBL: 25 cc  Contrast: 120 cc  Fluro Time: 7.8 minutes  Moderate Conscious Sedation Time: approximately 60 minutes using 6 mg of Versed and 250 mcg of Fentanyl  Indications: Patient is a 81 y.o.female with recurrent rest pain symptoms of the right leg from her peripheral arterial disease. The patient has noninvasive study showing a dramatic drop in her ABI at 0.5 on the right. The patient is brought in for angiography for further evaluation and potential treatment. Risks and benefits are discussed and informed consent is obtained  Procedure:  The patient was identified and appropriate procedural time out was performed. The patient was then placed supine on the table and prepped and draped in the usual sterile fashion.Moderate conscious sedation was administered throughout the procedure with my supervision of the RN administering medicines and monitoring the patient's vital signs, pulse oximetry, telemetry and mental status throughout from the start of the procedure until the patient was taken to the recovery room. Ultrasound was used to evaluate the left common femoral artery. It was patent . A digital ultrasound image was acquired. A Seldinger needle was used to access the left common femoral artery under direct ultrasound guidance and a permanent image was performed. A 0.035 J wire was advanced without resistance and a 5Fr sheath was placed. Pigtail catheter was placed into the aorta and an AP aortogram was performed. This demonstrated normal renal arteries and normal aorta and iliac segments without significant stenosis. I then crossed the aortic bifurcation and advanced to the right femoral head. Selective right lower extremity angiogram was then performed. This demonstrated high-grade stenosis of the distal common femoral artery and the origins of the profunda femoris artery and superficial femoral artery. Moderate to high-grade stenosis in the mid and distal superficial femoral artery of greater than 70%. A proximal 70-75% stenosis of the below-knee popliteal artery. Occlusion of the tibioperoneal trunk and proximal and mid peroneal artery with distal reconstitution seen on original imaging. Occlusion of the posterior tibial artery with no distal target seen. The patient was systemically heparinized and a 6 [place] Ansell sheath was then placed over the [employer] wire. I then used a Kumpe catheter and the advantage wire to navigate through the multiple SFA and popliteal lesions into the tibial vessels. I then exchanged for a 0.018 wire  and was able to cross the occlusion of the tibioperoneal trunk and peroneal artery and confirm intraluminal flow in the  distal peroneal artery with a CXI catheter. I then proceed with treatment. The wire was replaced. A 2.5 mm diameter 30 cm length angioplasty balloon was used to treat from the distal peroneal artery up through the tibioperoneal trunk. This was inflated to 8 atm for 1 minute. Completion angiogram showed in-line flow although the anterior tibial artery remained the dominant runoff to the foot. I then used a 4 mm diameter by 15 cm length Lutonix drug-coated angioplasty balloon for the popliteal artery below the knee to just above the knee. This was inflated to 8 atm for 1 minute as well. Completion angiogram showed less than 20% residual stenosis after angioplasty. The mid and distal SFA lesion was treated with a 5 mm diameter by 15 cm length Lutonix drug-coated angioplasty balloon. This was inflated to 12 atm for 1 minute. The common femoral artery and proximal SFA was then treated with a 5 mm diameter by 15 cm length Lutonix drug-coated angioplasty balloon inflated to 10 atm 1 minute. Completion angiogram showed multiple areas of dissection and greater than 50% residual stenosis throughout the SFA down to Hunter's canal and the above-knee popliteal artery. I elected to place a long Viabahn stent to treat these lesions. A 6 mm diameter by 25 cm length stent was started about 3-4 cm beyond the femoral bifurcation and taken down to the proximal popliteal artery. This was postdilated with a 5 mm balloon with no significant residual stenosis identified. I elected to terminate the procedure. The sheath was removed and StarClose closure device was deployed in the left femoral artery with excellent hemostatic result. The patient was taken to the recovery room in stable condition having tolerated the procedure well.  Findings:  Aortogram: Normal aorta, iliac arteries, and renal arteries  bilaterally Right Lower Extremity: High-grade stenosis of the distal common femoral artery and the origins of the profunda femoris artery and superficial femoral artery. Moderate to high-grade stenosis in the mid and distal superficial femoral artery of greater than 70%. A proximal 70-75% stenosis of the below-knee popliteal artery. Occlusion of the tibioperoneal trunk and proximal and mid peroneal artery with distal reconstitution seen on original imaging. Occlusion of the posterior tibial artery with no distal target seen.   Disposition: Patient was taken to the recovery room in stable condition having tolerated the procedure well.  Complications: None  Sena Clouatre 06/21/2015 10:44 AM

## 2015-06-22 ENCOUNTER — Emergency Department: Payer: Medicare Other

## 2015-06-22 ENCOUNTER — Emergency Department
Admission: EM | Admit: 2015-06-22 | Discharge: 2015-06-22 | Disposition: A | Discharge status: Home / Self Care | Payer: Medicare Other | Attending: Emergency Medicine | Admitting: Emergency Medicine

## 2015-06-22 DIAGNOSIS — Z880 Allergy status to penicillin: Secondary | ICD-10-CM | POA: Insufficient documentation

## 2015-06-22 DIAGNOSIS — Z87891 Personal history of nicotine dependence: Secondary | ICD-10-CM | POA: Diagnosis not present

## 2015-06-22 DIAGNOSIS — R600 Localized edema: Secondary | ICD-10-CM | POA: Insufficient documentation

## 2015-06-22 DIAGNOSIS — Z9862 Peripheral vascular angioplasty status: Secondary | ICD-10-CM | POA: Diagnosis not present

## 2015-06-22 DIAGNOSIS — G8918 Other acute postprocedural pain: Secondary | ICD-10-CM | POA: Diagnosis not present

## 2015-06-22 DIAGNOSIS — I10 Essential (primary) hypertension: Secondary | ICD-10-CM | POA: Insufficient documentation

## 2015-06-22 DIAGNOSIS — M79604 Pain in right leg: Secondary | ICD-10-CM | POA: Insufficient documentation

## 2015-06-22 DIAGNOSIS — R52 Pain, unspecified: Secondary | ICD-10-CM

## 2015-06-22 DIAGNOSIS — M79661 Pain in right lower leg: Secondary | ICD-10-CM | POA: Diagnosis not present

## 2015-06-22 LAB — BASIC METABOLIC PANEL
Anion gap: 7 (ref 5–15)
BUN: 20 mg/dL (ref 6–20)
CALCIUM: 8.5 mg/dL — AB (ref 8.9–10.3)
CO2: 26 mmol/L (ref 22–32)
CREATININE: 0.58 mg/dL (ref 0.44–1.00)
Chloride: 105 mmol/L (ref 101–111)
GLUCOSE: 139 mg/dL — AB (ref 65–99)
Potassium: 3.6 mmol/L (ref 3.5–5.1)
Sodium: 138 mmol/L (ref 135–145)

## 2015-06-22 LAB — CBC WITH DIFFERENTIAL/PLATELET
BASOS PCT: 0 %
Basophils Absolute: 0 10*3/uL (ref 0–0.1)
EOS ABS: 0.1 10*3/uL (ref 0–0.7)
Eosinophils Relative: 1 %
HEMATOCRIT: 36.6 % (ref 35.0–47.0)
Hemoglobin: 12.6 g/dL (ref 12.0–16.0)
LYMPHS ABS: 1 10*3/uL (ref 1.0–3.6)
LYMPHS PCT: 9 %
MCH: 31.9 pg (ref 26.0–34.0)
MCHC: 34.3 g/dL (ref 32.0–36.0)
MCV: 92.9 fL (ref 80.0–100.0)
Monocytes Absolute: 0.9 10*3/uL (ref 0.2–0.9)
Monocytes Relative: 8 %
NEUTROS ABS: 9.4 10*3/uL — AB (ref 1.4–6.5)
Neutrophils Relative %: 82 %
PLATELETS: 192 10*3/uL (ref 150–440)
RBC: 3.94 MIL/uL (ref 3.80–5.20)
RDW: 12.8 % (ref 11.5–14.5)
WBC: 11.4 10*3/uL — ABNORMAL HIGH (ref 3.6–11.0)

## 2015-06-22 MED ORDER — OXYCODONE-ACETAMINOPHEN 5-325 MG PO TABS: 2.0000 | ORAL_TABLET | Freq: Four times a day (QID) | ORAL | Status: DC | PRN

## 2015-06-22 MED ORDER — MORPHINE SULFATE (PF) 4 MG/ML IV SOLN
4.0000 mg | Freq: Once | INTRAVENOUS | Status: AC
  Administered 2015-06-22: 4 mg via INTRAVENOUS
  Filled 2015-06-22: qty 1

## 2015-06-22 NOTE — OR Nursing (Signed)
10:00 went to call for post follow up and noted the patient was in the emergency dept with pain in rt leg.

## 2015-06-22 NOTE — Discharge Instructions (Signed)
Pain Relief Preoperatively and Postoperatively  If you have questions, problems, or concerns about the pain that you may feel after surgery, let your health care provider know. Patients have the right to assessment and management of pain. Severe pain after surgery--and the fear or anxiety associated with that pain--may cause extreme discomfort that:  · Prevents sleep.  · Decreases the ability to breathe deeply and to cough. This can result in pneumonia or other upper airway infections.  · Causes the heart to beat more quickly and the blood pressure to be higher.  · Increases the risk for constipation and bloating.  · Decreases the ability of wounds to heal.  · May result in depression, increased anxiety, and feelings of helplessness.  Relieving pain before surgery (preoperatively) is also important because it lessens pain that you have after surgery (postoperatively). Patients who receive pain relief both before and after surgery experience greater pain relief than those who receive pain relief only after surgery. Let your health care provider know if you are having uncontrolled pain. This is very important. Pain after surgery is more difficult to manage if it is severe, so receiving prompt and adequate treatment of acute pain is necessary. If you become constipated after taking pain medicine, drink more liquids if you can. Your health care provider may have you take a mild laxative.  PAIN CONTROL METHODS  Your health care providers follow policies and procedures about the management of your pain. These guidelines should be explained to you before surgery. Plans for pain control after surgery must be decided upon by you and your health care provider and put into use with your full understanding and agreement. Do not be afraid to ask questions about the care that you are receiving.  Your health care providers will attempt to control your pain in various ways, and these methods may be used together (multimodal  analgesia). Using this approach has many benefits for you, including being able to eat, move around, and leave the hospital sooner.  As-Needed Pain Control  · You may be given pain medicine through an IV tube or as a pill or liquid that you can swallow. Let your health care provider know when you are having pain, and he or she will give you the pain medicine that is ordered for you.  IV Patient-Controlled Analgesia (PCA) Pump  · You can receive your pain medicine through an IV tube that goes into one of your veins. You can control the amount of pain medicine that you get. The pain medicine is controlled by a pump. When you push the button that is hooked up to this pump, you receive a specific amount of pain medicine. This button should be pushed only by you or by someone who is specifically assigned by you to do so. It is set up to keep you from accidentally giving yourself too much pain medicine. You will be able to start using your pain pump in the recovery room after your surgery. This method can be helpful for most types of surgery.  · Tell your health care provider:    If you are having too much pain.    If you are feeling too sleepy or nauseous.  Continuous Epidural Pain Control  · A thin, soft tube (catheter) is put into your back, outside the outer layer of your spinal cord. Pain medicine flows through the catheter to lessen pain in areas of your body that are below the level of catheter placement. Continuous epidural pain control may work best for you   if you are having surgery on your abdomen, hip area, or legs. The epidural catheter is usually put into your back shortly before surgery. It is left in until you can eat, take medicine by mouth, pass urine, and have a bowel movement.  · Giving pain medicine through the epidural catheter may help you to heal more quickly because you can do these things sooner:    Regain normal bowel and bladder function.    Return to eating.    Get up and walk.  Medicine That  Numbs the Area (Local Anesthetic)  You may be given pain medicine:  · As an injection near the area of the pain (local infiltration).  · As an injection near the nerve that controls the sensation to a specific part of your body (peripheral nerve block).  · In your spine to block pain (spinal block).  · Through a local anesthetic reservoir pump. If your surgeon or anesthesiologist selects this option as a part of your pain control, one or more thin, soft tubes will be inserted into your incision site(s) at the end of surgery. These tubes will be connected to a device that is filled with a non-narcotic pain medicine. This medicine gradually empties into your incision site over the next several days. Usually, after all of the medicine is used, your health care provider will remove the tubes and throw away the device.  Opioids  · Moderate to moderately severe acute pain after surgery may respond to opioids. Opioids are narcotic pain medicine. Opioids are often combined with non-narcotic medicines to improve pain relief, lower the risk of side effects, and reduce the chance of addiction.  · If you follow your health care provider's directions about taking opioids and you do not have a history of substance abuse, your risk of becoming addicted is very small. To prevent addiction, opioids are given for short periods of time in careful doses.  Other Methods of Pain Control  · Steroids.  · Physical therapy.  · Heat and cold therapy.  · Compression, such as wrapping an elastic bandage around the area of the pain.  · Massage.     This information is not intended to replace advice given to you by your health care provider. Make sure you discuss any questions you have with your health care provider.     Document Released: 07/22/2002 Document Revised: 05/22/2014 Document Reviewed: 07/26/2010  Elsevier Interactive Patient Education ©2016 [employer]

## 2015-06-22 NOTE — ED Provider Notes (Signed)
[facility] Emergency Department Provider Note     Time seen: ----------------------------------------- 7:53 AM on 06/22/2015 -----------------------------------------    I have reviewed the triage vital signs and the nursing notes.   HISTORY  Chief Complaint Post-op Problem    HPI Ashley Hill is a 81 y.o. female who presents to ER with pain in her right leg since having an angioplasty yesterday. Patient describes the pain as excruciating, worsen her right Eyebrow on the right knee. Patient is had hydrocodone and Tylenol No. 3 today. She called the surgeon's office and was told to come to the ER for further evaluation. She states she states she had angioplasty and stent placement.   Past Medical History  Diagnosis Date  . Dyslipidemia   . HOH (hard of hearing)   . Heart murmur   . H/O scarlet fever   . Hypertension   . 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 Harvie Bridge, [place].  . Ectopic atrial tachycardia (HCC)   . Hyperlipidemia   . Transient cerebral ischemia   . Peripheral vascular disease [facility])     Patient Active Problem List   Diagnosis Date Noted  . Polymyalgia rheumatica (HCC) 10/27/2014  . Carotid artery narrowing 10/05/2014  . Atrioventricular nodal re-entry tachycardia (HCC) 10/05/2014  . Lesion of right parietal bone 01/27/2014  . Left sided numbness 01/05/2014  . Medicare annual wellness visit, initial 12/09/2013  . Bleeding 12/09/2013  . Dizziness and giddiness 12/09/2013  . Preop cardiovascular exam 04/21/2013  . Arthritis, degenerative 10/23/2012  . Chest pain 07/22/2012  . SVT (supraventricular tachycardia) (HCC)   . Hypertension   . Hyperlipidemia     Past Surgical History  Procedure Laterality Date  . Tonsillectomy    . Vaginal hysterectomy    . Knee arthroscopy w/ meniscal repair      Right   . Total knee arthroplasty  2015  . Ablation of  dysrhythmic focus    . Peripheral vascular catheterization Right 01/11/2015    Procedure: Lower Extremity Angiography;  Surgeon: Annice Needy, MD;  Location: [facility] INVASIVE CV LAB;  Service: Cardiovascular;  Laterality: Right;  . Peripheral vascular catheterization Right 06/21/2015    Procedure: Lower Extremity Angiography;  Surgeon: Annice Needy, MD;  Location: [facility] INVASIVE CV LAB;  Service: Cardiovascular;  Laterality: Right;  . Peripheral vascular catheterization Right 06/21/2015    Procedure: Lower Extremity Intervention;  Surgeon: Annice Needy, MD;  Location: [facility] INVASIVE CV LAB;  Service: Cardiovascular;  Laterality: Right;    Allergies Penicillins and Statins  Social History Social History  Substance Use Topics  . Smoking status: Former Smoker -- 1.00 packs/day for 10 years    Types: Cigarettes  . Smokeless tobacco: Never Used  . Alcohol Use: No    Review of Systems Constitutional: Negative for fever. Cardiovascular: Negative for chest pain. Respiratory: Negative for shortness of breath. Musculoskeletal: Positive for right leg pain Skin: Positive for right lower extremity redness Neurological: Negative for headaches, focal weakness or numbness.   ____________________________________________   PHYSICAL EXAM:  VITAL SIGNS: ED Triage Vitals  Enc Vitals Group     BP 06/22/15 0618 181/84 mmHg     Pulse Rate 06/22/15 0618 99     Resp 06/22/15 0618 20     Temp 06/22/15 0618 97.6 F (36.4 C)     Temp Source 06/22/15 0618 Oral     SpO2 06/22/15 0618 95 %  Weight 06/22/15 0618 137 lb (62.143 kg)     Height 06/22/15 0618  (1.651 m)     Head Cir --      Peak Flow --      Pain Score 06/22/15 0617 10     Pain Loc --      Pain Edu? --      Excl. in GC? --     Constitutional: Alert and oriented. Well appearing and in no distress. Eyes: Conjunctivae are normal. PERRL. Normal extraocular movements. Cardiovascular: Normal rate, regular rhythm. Normal and symmetric distal  pulses are present in all extremities, especially in the right lower extremity. No murmurs, rubs, or gallops. Respiratory: Normal respiratory effort without tachypnea nor retractions. Breath sounds are clear and equal bilaterally. No wheezes/rales/rhonchi. Gastrointestinal: Soft and nontender. No distention. No abdominal bruits.  Musculoskeletal: Right lower extremity is nontender with minimal edema noted Neurologic:  Normal speech and language. No gross focal neurologic deficits are appreciated. Speech is normal. No gait instability. Skin:  Skin is warm, dry and intact. No rash noted.   ____________________________________________  ED COURSE:  Pertinent labs & imaging results that were available during my care of the patient were reviewed by me and considered in my medical decision making (see chart for details). Patient is in no acute distress, ABIs were performed on the right which was 0.78. Good pulses clinically. ____________________________________________    LABS (pertinent positives/negatives)  Labs Reviewed  CBC WITH DIFFERENTIAL/PLATELET - Abnormal; Notable for the following:    WBC 11.4 (*)    All other components within normal limits  BASIC METABOLIC PANEL - Abnormal; Notable for the following:    Glucose, Bld 139 (*)    Calcium 8.5 (*)    All other components within normal limits    RADIOLOGY  Ultrasound venous of the right lower extremity IMPRESSION: No evidence of deep venous thrombosis.  ____________________________________________  FINAL ASSESSMENT AND PLAN  Postoperative pain  Plan: Patient with labs and imaging as dictated above. I have rechecked ABIs in her right leg which is 0.82. Normal ultrasound here with good pulses in the right lower extremity. I discussed with vascular surgery and she'll be seen in 3 days in the office for follow-up. She'll be given Percocet and advised to elevate the leg.   Emily Filbert, MD   Emily Filbert,  MD 06/22/15 [phone]

## 2015-06-22 NOTE — ED Notes (Addendum)
Pt to triage via w/c with no distress noted; Pt reports angioplasty yesterday (rt groin) by Dr Wyn Quaker; st awoke PTA with pain to right lower leg/foot; +PP, brisk cap refill, W&D, good movem/sensation, no swelling noted

## 2015-06-22 NOTE — ED Notes (Signed)
Pt transported to ultrasound.

## 2015-06-22 NOTE — ED Notes (Signed)
Pt reports pain in her upper and lower right leg after angioplasty yesterday. Pt describes pain as "excrutiating." She has had hydrocodone (yesterday) and tylenol #3 today. Pt called surgeon's office and was told to come here since pain was so bad. Pt denies fever, chills. Pt localizes pain to back of right calf and in the knees as well.

## 2015-06-25 DIAGNOSIS — I70221 Atherosclerosis of native arteries of extremities with rest pain, right leg: Secondary | ICD-10-CM | POA: Diagnosis not present

## 2015-06-25 DIAGNOSIS — I739 Peripheral vascular disease, unspecified: Secondary | ICD-10-CM | POA: Diagnosis not present

## 2015-06-25 DIAGNOSIS — E785 Hyperlipidemia, unspecified: Secondary | ICD-10-CM | POA: Diagnosis not present

## 2015-06-25 DIAGNOSIS — I10 Essential (primary) hypertension: Secondary | ICD-10-CM | POA: Diagnosis not present

## 2015-06-25 DIAGNOSIS — I70212 Atherosclerosis of native arteries of extremities with intermittent claudication, left leg: Secondary | ICD-10-CM | POA: Diagnosis not present

## 2015-06-25 DIAGNOSIS — I70211 Atherosclerosis of native arteries of extremities with intermittent claudication, right leg: Secondary | ICD-10-CM | POA: Diagnosis not present

## 2015-06-30 DIAGNOSIS — E782 Mixed hyperlipidemia: Secondary | ICD-10-CM | POA: Diagnosis not present

## 2015-06-30 DIAGNOSIS — I70219 Atherosclerosis of native arteries of extremities with intermittent claudication, unspecified extremity: Secondary | ICD-10-CM | POA: Insufficient documentation

## 2015-06-30 DIAGNOSIS — I471 Supraventricular tachycardia: Secondary | ICD-10-CM | POA: Diagnosis not present

## 2015-06-30 DIAGNOSIS — I739 Peripheral vascular disease, unspecified: Secondary | ICD-10-CM | POA: Diagnosis not present

## 2015-06-30 DIAGNOSIS — I10 Essential (primary) hypertension: Secondary | ICD-10-CM | POA: Diagnosis not present

## 2015-07-12 DIAGNOSIS — I70211 Atherosclerosis of native arteries of extremities with intermittent claudication, right leg: Secondary | ICD-10-CM | POA: Diagnosis not present

## 2015-07-12 DIAGNOSIS — I70212 Atherosclerosis of native arteries of extremities with intermittent claudication, left leg: Secondary | ICD-10-CM | POA: Diagnosis not present

## 2015-07-12 DIAGNOSIS — E785 Hyperlipidemia, unspecified: Secondary | ICD-10-CM | POA: Diagnosis not present

## 2015-07-12 DIAGNOSIS — I739 Peripheral vascular disease, unspecified: Secondary | ICD-10-CM | POA: Diagnosis not present

## 2015-07-12 DIAGNOSIS — I70221 Atherosclerosis of native arteries of extremities with rest pain, right leg: Secondary | ICD-10-CM | POA: Diagnosis not present

## 2015-07-12 DIAGNOSIS — I10 Essential (primary) hypertension: Secondary | ICD-10-CM | POA: Diagnosis not present

## 2015-08-03 ENCOUNTER — Other Ambulatory Visit: Payer: Self-pay | Admitting: Internal Medicine

## 2015-08-03 DIAGNOSIS — Z1231 Encounter for screening mammogram for malignant neoplasm of breast: Secondary | ICD-10-CM

## 2015-08-03 DIAGNOSIS — I471 Supraventricular tachycardia: Secondary | ICD-10-CM | POA: Diagnosis not present

## 2015-08-03 DIAGNOSIS — I6523 Occlusion and stenosis of bilateral carotid arteries: Secondary | ICD-10-CM | POA: Diagnosis not present

## 2015-08-03 DIAGNOSIS — Z1329 Encounter for screening for other suspected endocrine disorder: Secondary | ICD-10-CM | POA: Diagnosis not present

## 2015-08-03 DIAGNOSIS — Z1239 Encounter for other screening for malignant neoplasm of breast: Secondary | ICD-10-CM | POA: Diagnosis not present

## 2015-08-03 DIAGNOSIS — I739 Peripheral vascular disease, unspecified: Secondary | ICD-10-CM | POA: Diagnosis not present

## 2015-08-03 DIAGNOSIS — I10 Essential (primary) hypertension: Secondary | ICD-10-CM | POA: Diagnosis not present

## 2015-08-03 DIAGNOSIS — Z13820 Encounter for screening for osteoporosis: Secondary | ICD-10-CM | POA: Diagnosis not present

## 2015-08-03 DIAGNOSIS — E782 Mixed hyperlipidemia: Secondary | ICD-10-CM | POA: Diagnosis not present

## 2015-08-03 DIAGNOSIS — Z79899 Other long term (current) drug therapy: Secondary | ICD-10-CM | POA: Diagnosis not present

## 2015-08-09 ENCOUNTER — Other Ambulatory Visit: Payer: Self-pay | Admitting: Internal Medicine

## 2015-08-09 ENCOUNTER — Ambulatory Visit
Admission: RE | Admit: 2015-08-09 | Discharge: 2015-08-09 | Disposition: A | Discharge status: Home / Self Care | Payer: Medicare Other | Source: Ambulatory Visit | Attending: Internal Medicine | Admitting: Internal Medicine

## 2015-08-09 DIAGNOSIS — Z1231 Encounter for screening mammogram for malignant neoplasm of breast: Secondary | ICD-10-CM | POA: Diagnosis not present

## 2015-08-09 DIAGNOSIS — Z9882 Breast implant status: Secondary | ICD-10-CM | POA: Insufficient documentation

## 2015-08-13 DIAGNOSIS — M8588 Other specified disorders of bone density and structure, other site: Secondary | ICD-10-CM | POA: Diagnosis not present

## 2015-08-13 DIAGNOSIS — Z13820 Encounter for screening for osteoporosis: Secondary | ICD-10-CM | POA: Diagnosis not present

## 2015-09-02 ENCOUNTER — Other Ambulatory Visit: Payer: Self-pay | Admitting: Cardiovascular Disease

## 2015-09-28 ENCOUNTER — Other Ambulatory Visit: Payer: Self-pay | Admitting: Vascular Surgery

## 2015-09-28 DIAGNOSIS — E785 Hyperlipidemia, unspecified: Secondary | ICD-10-CM | POA: Diagnosis not present

## 2015-09-28 DIAGNOSIS — I70212 Atherosclerosis of native arteries of extremities with intermittent claudication, left leg: Secondary | ICD-10-CM | POA: Diagnosis not present

## 2015-09-28 DIAGNOSIS — I70221 Atherosclerosis of native arteries of extremities with rest pain, right leg: Secondary | ICD-10-CM | POA: Diagnosis not present

## 2015-09-28 DIAGNOSIS — I70211 Atherosclerosis of native arteries of extremities with intermittent claudication, right leg: Secondary | ICD-10-CM | POA: Diagnosis not present

## 2015-09-28 DIAGNOSIS — I10 Essential (primary) hypertension: Secondary | ICD-10-CM | POA: Diagnosis not present

## 2015-09-28 DIAGNOSIS — I739 Peripheral vascular disease, unspecified: Secondary | ICD-10-CM | POA: Diagnosis not present

## 2015-10-08 ENCOUNTER — Other Ambulatory Visit
Admission: RE | Admit: 2015-10-08 | Discharge: 2015-10-08 | Disposition: A | Discharge status: Home / Self Care | Payer: Medicare Other | Source: Ambulatory Visit | Attending: Vascular Surgery | Admitting: Vascular Surgery

## 2015-10-08 DIAGNOSIS — I739 Peripheral vascular disease, unspecified: Secondary | ICD-10-CM | POA: Diagnosis not present

## 2015-10-08 LAB — BUN: BUN: 24 mg/dL — AB (ref 6–20)

## 2015-10-08 LAB — CREATININE, SERUM
Creatinine, Ser: 0.6 mg/dL (ref 0.44–1.00)
GFR calc non Af Amer: >60 mL/min (ref >60)

## 2015-10-12 ENCOUNTER — Encounter
Admission: RE | Disposition: A | Discharge status: Home / Self Care | Payer: Self-pay | Source: Ambulatory Visit | Attending: Vascular Surgery

## 2015-10-12 ENCOUNTER — Ambulatory Visit
Admission: RE | Admit: 2015-10-12 | Discharge: 2015-10-12 | Disposition: A | Discharge status: Home / Self Care | Payer: Medicare Other | Source: Ambulatory Visit | Attending: Vascular Surgery | Admitting: Vascular Surgery

## 2015-10-12 DIAGNOSIS — I8310 Varicose veins of unspecified lower extremity with inflammation: Secondary | ICD-10-CM | POA: Insufficient documentation

## 2015-10-12 DIAGNOSIS — I999 Unspecified disorder of circulatory system: Secondary | ICD-10-CM | POA: Insufficient documentation

## 2015-10-12 DIAGNOSIS — E785 Hyperlipidemia, unspecified: Secondary | ICD-10-CM | POA: Diagnosis not present

## 2015-10-12 DIAGNOSIS — M79606 Pain in leg, unspecified: Secondary | ICD-10-CM | POA: Diagnosis not present

## 2015-10-12 DIAGNOSIS — M79609 Pain in unspecified limb: Secondary | ICD-10-CM | POA: Insufficient documentation

## 2015-10-12 DIAGNOSIS — Z7982 Long term (current) use of aspirin: Secondary | ICD-10-CM | POA: Diagnosis not present

## 2015-10-12 DIAGNOSIS — I10 Essential (primary) hypertension: Secondary | ICD-10-CM | POA: Insufficient documentation

## 2015-10-12 DIAGNOSIS — I70221 Atherosclerosis of native arteries of extremities with rest pain, right leg: Secondary | ICD-10-CM | POA: Diagnosis not present

## 2015-10-12 DIAGNOSIS — Z7902 Long term (current) use of antithrombotics/antiplatelets: Secondary | ICD-10-CM | POA: Diagnosis not present

## 2015-10-12 DIAGNOSIS — R202 Paresthesia of skin: Secondary | ICD-10-CM | POA: Diagnosis not present

## 2015-10-12 DIAGNOSIS — I70211 Atherosclerosis of native arteries of extremities with intermittent claudication, right leg: Secondary | ICD-10-CM | POA: Insufficient documentation

## 2015-10-12 DIAGNOSIS — Z888 Allergy status to other drugs, medicaments and biological substances status: Secondary | ICD-10-CM | POA: Insufficient documentation

## 2015-10-12 DIAGNOSIS — Z880 Allergy status to penicillin: Secondary | ICD-10-CM | POA: Insufficient documentation

## 2015-10-12 DIAGNOSIS — I739 Peripheral vascular disease, unspecified: Secondary | ICD-10-CM | POA: Insufficient documentation

## 2015-10-12 DIAGNOSIS — R58 Hemorrhage, not elsewhere classified: Secondary | ICD-10-CM | POA: Diagnosis not present

## 2015-10-12 DIAGNOSIS — I70212 Atherosclerosis of native arteries of extremities with intermittent claudication, left leg: Secondary | ICD-10-CM | POA: Diagnosis not present

## 2015-10-12 HISTORY — PX: PERIPHERAL VASCULAR CATHETERIZATION: SHX172C

## 2015-10-12 SURGERY — LOWER EXTREMITY INTERVENTION
Site: Leg Lower | Laterality: Right

## 2015-10-12 MED ORDER — FENTANYL CITRATE (PF) 100 MCG/2ML IJ SOLN
INTRAMUSCULAR | Status: AC
  Filled 2015-10-12: qty 2

## 2015-10-12 MED ORDER — CLINDAMYCIN PHOSPHATE 300 MG/50ML IV SOLN
INTRAVENOUS | Status: AC
  Filled 2015-10-12: qty 50

## 2015-10-12 MED ORDER — HYDROMORPHONE HCL 1 MG/ML IJ SOLN: 0.5000 mg | INTRAMUSCULAR | Status: DC | PRN

## 2015-10-12 MED ORDER — PHENOL 1.4 % MT LIQD: 1.0000 | OROMUCOSAL | Status: DC | PRN

## 2015-10-12 MED ORDER — METOPROLOL TARTRATE 5 MG/5ML IV SOLN: 2.0000 mg | INTRAVENOUS | Status: DC | PRN

## 2015-10-12 MED ORDER — MIDAZOLAM HCL 2 MG/2ML IJ SOLN
INTRAMUSCULAR | Status: AC
  Filled 2015-10-12: qty 2

## 2015-10-12 MED ORDER — ONDANSETRON HCL 4 MG/2ML IJ SOLN: 4.0000 mg | Freq: Four times a day (QID) | INTRAMUSCULAR | Status: DC | PRN

## 2015-10-12 MED ORDER — FAMOTIDINE 20 MG PO TABS: 40.0000 mg | ORAL_TABLET | ORAL | Status: DC | PRN

## 2015-10-12 MED ORDER — METHYLPREDNISOLONE SODIUM SUCC 125 MG IJ SOLR: 125.0000 mg | INTRAMUSCULAR | Status: DC | PRN

## 2015-10-12 MED ORDER — HEPARIN (PORCINE) IN NACL 2-0.9 UNIT/ML-% IJ SOLN
INTRAMUSCULAR | Status: AC
  Filled 2015-10-12: qty 1000

## 2015-10-12 MED ORDER — APIXABAN 5 MG PO TABS: 5.0000 mg | ORAL_TABLET | Freq: Two times a day (BID) | ORAL | Status: DC

## 2015-10-12 MED ORDER — OXYCODONE-ACETAMINOPHEN 5-325 MG PO TABS: 1.0000 | ORAL_TABLET | ORAL | Status: DC | PRN

## 2015-10-12 MED ORDER — FENTANYL CITRATE (PF) 100 MCG/2ML IJ SOLN
INTRAMUSCULAR | Status: DC | PRN
  Administered 2015-10-12 (×6): 50 ug via INTRAVENOUS

## 2015-10-12 MED ORDER — CLINDAMYCIN PHOSPHATE 300 MG/50ML IV SOLN
300.0000 mg | Freq: Once | INTRAVENOUS | Status: AC
  Administered 2015-10-12: 300 mg via INTRAVENOUS

## 2015-10-12 MED ORDER — ACETAMINOPHEN 325 MG PO TABS: 325.0000 mg | ORAL_TABLET | ORAL | Status: DC | PRN

## 2015-10-12 MED ORDER — LIDOCAINE-EPINEPHRINE (PF) 1 %-1:200000 IJ SOLN
INTRAMUSCULAR | Status: AC
  Filled 2015-10-12: qty 30

## 2015-10-12 MED ORDER — SODIUM CHLORIDE 0.9 % IV SOLN
INTRAVENOUS | Status: DC
  Administered 2015-10-12 (×2): via INTRAVENOUS

## 2015-10-12 MED ORDER — HEPARIN SODIUM (PORCINE) 1000 UNIT/ML IJ SOLN
INTRAMUSCULAR | Status: AC
  Filled 2015-10-12: qty 1

## 2015-10-12 MED ORDER — HYDROMORPHONE HCL 1 MG/ML IJ SOLN: 1.0000 mg | Freq: Once | INTRAMUSCULAR | Status: DC

## 2015-10-12 MED ORDER — HYDRALAZINE HCL 20 MG/ML IJ SOLN: 5.0000 mg | INTRAMUSCULAR | Status: DC | PRN

## 2015-10-12 MED ORDER — LABETALOL HCL 5 MG/ML IV SOLN: 10.0000 mg | INTRAVENOUS | Status: DC | PRN

## 2015-10-12 MED ORDER — HEPARIN SODIUM (PORCINE) 1000 UNIT/ML IJ SOLN
INTRAMUSCULAR | Status: DC | PRN
  Administered 2015-10-12: 5000 [IU] via INTRAVENOUS

## 2015-10-12 MED ORDER — MIDAZOLAM HCL 2 MG/2ML IJ SOLN
INTRAMUSCULAR | Status: DC | PRN
  Administered 2015-10-12 (×2): 1 mg via INTRAVENOUS
  Administered 2015-10-12: 2 mg via INTRAVENOUS
  Administered 2015-10-12 (×3): 1 mg via INTRAVENOUS

## 2015-10-12 MED ORDER — GUAIFENESIN-DM 100-10 MG/5ML PO SYRP: 15.0000 mL | ORAL_SOLUTION | ORAL | Status: DC | PRN

## 2015-10-12 MED ORDER — IOPAMIDOL (ISOVUE-300) INJECTION 61%
INTRAVENOUS | Status: DC | PRN
  Administered 2015-10-12: 50 mL via INTRA_ARTERIAL

## 2015-10-12 MED ORDER — MIDAZOLAM HCL 5 MG/5ML IJ SOLN
INTRAMUSCULAR | Status: AC
  Filled 2015-10-12: qty 5

## 2015-10-12 MED ORDER — SODIUM CHLORIDE 0.9 % IV SOLN: 500.0000 mL | Freq: Once | INTRAVENOUS | Status: DC | PRN

## 2015-10-12 MED ORDER — ACETAMINOPHEN 325 MG RE SUPP: 325.0000 mg | RECTAL | Status: DC | PRN

## 2015-10-12 SURGICAL SUPPLY — 25 items
BALLN LUTONIX 5X150X130 (BALLOONS) ×3
BALLN LUTONIX DCB 4X80X130 (BALLOONS) ×3
BALLN LUTONIX DCB 5X100X130 (BALLOONS) ×3
BALLN ULTRVRSE 2.5X220X150 (BALLOONS) ×3
BALLN ULTRVRSE 3X100X150 (BALLOONS) ×3
BALLOON LUTONIX 5X150X130 (BALLOONS) ×2 IMPLANT
BALLOON LUTONIX DCB 4X80X130 (BALLOONS) ×2 IMPLANT
BALLOON LUTONIX DCB 5X100X130 (BALLOONS) ×2 IMPLANT
BALLOON ULTRVRSE 2.5X220X150 (BALLOONS) ×2 IMPLANT
BALLOON ULTRVRSE 3X100X150 (BALLOONS) ×2 IMPLANT
CATH PIG 70CM (CATHETERS) ×3 IMPLANT
CATH VERT 100CM (CATHETERS) ×3 IMPLANT
DEVICE PRESTO INFLATION (MISCELLANEOUS) ×3 IMPLANT
DEVICE STARCLOSE SE CLOSURE (Vascular Products) ×3 IMPLANT
GLIDEWIRE ADV .035X260CM (WIRE) ×3 IMPLANT
GUIDEWIRE SUPER STIFF .035X180 (WIRE) ×3 IMPLANT
PACK ANGIOGRAPHY (CUSTOM PROCEDURE TRAY) ×3 IMPLANT
SHEATH ANL2 6FRX45 HC (SHEATH) ×3 IMPLANT
SHEATH BRITE TIP 4FRX11 (SHEATH) ×3 IMPLANT
SHEATH BRITE TIP 5FRX11 (SHEATH) ×3 IMPLANT
SYR MEDRAD MARK V 150ML (SYRINGE) ×3 IMPLANT
TUBING CONTRAST HIGH PRESS 72 (TUBING) ×3 IMPLANT
WIRE G V18X300CM (WIRE) ×3 IMPLANT
WIRE G.018X180X3 SHT (WIRE) IMPLANT
WIRE J 3MM .035X145CM (WIRE) ×3 IMPLANT

## 2015-10-12 NOTE — Discharge Instructions (Signed)
Angiogram, Care After °Refer to this sheet in the next few weeks. These instructions provide you with information about caring for yourself after your procedure. Your health care provider may also give you more specific instructions. Your treatment has been planned according to current medical practices, but problems sometimes occur. Call your health care provider if you have any problems or questions after your procedure. °WHAT TO EXPECT AFTER THE PROCEDURE °After your procedure, it is typical to have the following: °· Bruising at the catheter insertion site that usually fades within 1-2 weeks. °· Blood collecting in the tissue (hematoma) that may be painful to the touch. It should usually decrease in size and tenderness within 1-2 weeks. °HOME CARE INSTRUCTIONS °· Take medicines only as directed by your health care provider. °· You may shower 24-48 hours after the procedure or as directed by your health care provider. Remove the bandage (dressing) and gently wash the site with plain soap and water. Pat the area dry with a clean towel. Do not rub the site, because this may cause bleeding. °· Do not take baths, swim, or use a hot tub until your health care provider approves. °· Check your insertion site every day for redness, swelling, or drainage. °· Do not apply powder or lotion to the site. °· Do not lift over 10 lb (4.5 kg) for 5 days after your procedure or as directed by your health care provider. °· Ask your health care provider when it is okay to: °¨ Return to work or school. °¨ Resume usual physical activities or sports. °¨ Resume sexual activity. °· Do not drive home if you are discharged the same day as the procedure. Have someone else drive you. °· You may drive 24 hours after the procedure unless otherwise instructed by your health care provider. °· Do not operate machinery or power tools for 24 hours after the procedure or as directed by your health care provider. °· If your procedure was done as an  outpatient procedure, which means that you went home the same day as your procedure, a responsible adult should be with you for the first 24 hours after you arrive home. °· Keep all follow-up visits as directed by your health care provider. This is important. °SEEK MEDICAL CARE IF: °· You have a fever. °· You have chills. °· You have increased bleeding from the catheter insertion site. Hold pressure on the site. °SEEK IMMEDIATE MEDICAL CARE IF: °· You have unusual pain at the catheter insertion site. °· You have redness, warmth, or swelling at the catheter insertion site. °· You have drainage (other than a small amount of blood on the dressing) from the catheter insertion site. °· The catheter insertion site is bleeding, and the bleeding does not stop after 30 minutes of holding steady pressure on the site. °· The area near or just beyond the catheter insertion site becomes pale, cool, tingly, or numb. °  °This information is not intended to replace advice given to you by your health care provider. Make sure you discuss any questions you have with your health care provider. °  °Document Released: 11/17/2004 Document Revised: 05/22/2014 Document Reviewed: 10/02/2012 °Elsevier Interactive Patient Education ©2016 [employer] ° °

## 2015-10-12 NOTE — Op Note (Signed)
[facility] Percutaneous Study/Intervention Procedural Note   Date of Surgery: 10/12/2015  Surgeon(s):Analia Zuk   Assistants:none  Pre-operative Diagnosis: PAD with claudication right lower extremity  Post-operative diagnosis: Same  Procedure(s) Performed: 1. Ultrasound guidance for vascular access left femoral artery 2. Catheter placement into right peroneal artery and anterior tibial artery from left femoral approach 3. Aortogram and selective right lower extremity angiogram 4. Percutaneous transluminal angioplasty of tibioperoneal trunk and peroneal artery with 2.5 mm diameter angioplasty balloon 5. Percutaneous transluminal angioplasty of the proximal anterior tibial artery with 3 mm diameter angioplasty balloon  6.  Percutaneous transluminal angioplasty of the popliteal artery with a 5 mm diameter Lutonix drug-coated angioplasty balloon proximally and a 4 mm diameter Lutonix drug-coated angioplasty balloon distally  7.  Percutaneous transluminal angioplasty of the common femoral artery and proximal superficial femoral artery with 5 mm diameter Lutonix drug-coated angioplasty balloon 8. StarClose closure device left femoral artery  EBL: 25 cc  Contrast: 50 cc  Fluoro Time: 6.2 minutes  Moderate Conscious Sedation Time: approximately 45 minutes using 7 mg of Versed and 300 mcg of Fentanyl  Indications: Patient is a 81 y.o.female with recurrent short distance claudication of the right lower extremity. The patient has noninvasive study showing a drop in her ABI and multiple significant areas of stenosis or occlusion on duplex. The patient is brought in for angiography for further evaluation and potential treatment. Risks and benefits are discussed and informed consent is obtained  Procedure: The patient was identified and appropriate procedural time out was  performed. The patient was then placed supine on the table and prepped and draped in the usual sterile fashion.Moderate conscious sedation was administered during a face to face encounter with the patient throughout the procedure with my supervision of the RN administering medicines and monitoring the patient's vital signs, pulse oximetry, telemetry and mental status throughout from the start of the procedure until the patient was taken to the recovery room. Ultrasound was used to evaluate the left common femoral artery. It was patent . A digital ultrasound image was acquired. A Seldinger needle was used to access the left common femoral artery under direct ultrasound guidance and a permanent image was performed. A 0.035 J wire was advanced without resistance and a 5Fr sheath was placed. Pigtail catheter was placed into the aorta and an AP aortogram was performed. This demonstrated normal renal artery and possible moderate stenosis of the right renal artery although this was not well seen and normal aorta and iliac segments without significant stenosis. I then crossed the aortic bifurcation and advanced to the right femoral head. Selective right lower extremity angiogram was then performed. This demonstrated 70-75% stenosis of the common femoral artery and proximal SFA. The SFA stent was widely patent. The popliteal artery just below the stent had moderate stenosis with an occlusion of the popliteal artery just above the knee. There is occlusion of the proximal anterior tibial artery and tibioperoneal trunk with reconstitution of the peroneal and anterior tibial arteries beyond this occlusion. The patient was systemically heparinized and a 6 [place] Ansell sheath was then placed over the [employer] wire. I then used a Kumpe catheter and the advantage wire to navigate through the SFA stenosis, the popliteal artery stenosis and occlusion, and through the tibioperoneal trunk and peroneal artery occlusion  and parked the catheter in the peroneal artery. I then exchanged for a 0.018 wire. The peroneal artery and tibioperoneal trunk were treated with a 2.5 mm diameter by 22  cm length angioplasty balloon. This was inflated to 12 atm for 1 minute. This resulted in significant improvement in flow and less than 30% residual stenosis. I then turned my attention to the anterior tibial artery. With the Kumpe catheter and an advantage wire was able to cross the occlusion and confirm intraluminal flow in the anterior tibial artery beyond the occlusion. I replaced a 0.018 wire and used a 3 mm diameter by 10 cm length angioplasty balloon. This was inflated to 8 atm in the proximal anterior tibial artery. Completion angiogram showed about a 30% residual stenosis in the proximal anterior tibial artery but better flow. To address the popliteal artery lesion, a 4 mm diameter by 8 cm length Lutonix drug-coated angioplasty balloon was inflated in the below-knee popliteal artery and a 5 mm diameter by 15 cm length Lutonix drug-coated angioplasty balloon was inflated in the above-knee popliteal artery up to the previously placed stent. The 4 mm balloon was inflated to 10 atm and the 5 mm balloon was inflated to 8 atm. Completion angiogram following these inflation showed less than 25% residual stenosis and no flow limiting dissection. Due to her knee prosthesis, the patient had to be put in a frog-leg position to image this which was difficult but good pictures were obtained. I then turned my attention to the common femoral artery and proximal SFA. A 5 mm diameter by 10 cm length Lutonix drug-coated angioplasty balloon was used to treat the common femoral artery and proximal SFA. This was inflated gently to 6 atm for 1 minute to avoid any dissection. Completion angiogram showed about a 35-40% residual stenosis in the mid common femoral artery and a less than 20% stenosis in the proximal SFA. This did not appear flow limiting. I elected to  terminate the procedure. The sheath was removed and StarClose closure device was deployed in the left femoral artery with excellent hemostatic result. The patient was taken to the recovery room in stable condition having tolerated the procedure well.  Findings:  Aortogram: moderate stenosis of right renal artery, but not well visualized.  Left renal artery widely patent.  Aorta and iliac segments were normal. Right Lower Extremity: 70-75% stenosis of the common femoral artery and proximal SFA. The SFA stent was widely patent. The popliteal artery just below the stent had moderate stenosis with an occlusion of the popliteal artery just above the knee. There is occlusion of the proximal anterior tibial artery and tibioperoneal trunk with reconstitution of the peroneal and anterior tibial arteries beyond this occlusion.   Disposition: Patient was taken to the recovery room in stable condition having tolerated the procedure well.  Complications: None  Ashley Hill 10/12/2015 3:10 PM

## 2015-10-12 NOTE — H&P (Signed)
  [facility] History & Physical Update  The patient was interviewed and re-examined.  The patient's previous History and Physical has been reviewed and is unchanged.  There is no change in the plan of care. We plan to proceed with the scheduled procedure.  Dariane Natzke, MD  10/12/2015, 1:39 PM

## 2015-10-12 NOTE — Progress Notes (Signed)
Pt clinically stable post procedure, no bleeding nor hematoma at left groin, daughter present, taking po';s without difficulty, Dr Wyn Quakerew out to speak with pt/family with questions answered, return appt. Given with questions answered,

## 2015-10-13 ENCOUNTER — Encounter: Payer: Self-pay | Admitting: Vascular Surgery

## 2015-10-19 ENCOUNTER — Other Ambulatory Visit: Payer: Self-pay | Admitting: Cardiovascular Disease

## 2015-10-21 ENCOUNTER — Telehealth: Payer: Self-pay | Admitting: Internal Medicine

## 2015-10-21 NOTE — Telephone Encounter (Signed)
Left message asking pt to call office per thn Needs to schedule either follow up or cpx with pcp Has she changed pcp to dr sparks

## 2015-11-12 DIAGNOSIS — I70211 Atherosclerosis of native arteries of extremities with intermittent claudication, right leg: Secondary | ICD-10-CM | POA: Diagnosis not present

## 2015-11-12 DIAGNOSIS — I739 Peripheral vascular disease, unspecified: Secondary | ICD-10-CM | POA: Diagnosis not present

## 2015-11-12 DIAGNOSIS — I70212 Atherosclerosis of native arteries of extremities with intermittent claudication, left leg: Secondary | ICD-10-CM | POA: Diagnosis not present

## 2015-11-12 DIAGNOSIS — E785 Hyperlipidemia, unspecified: Secondary | ICD-10-CM | POA: Diagnosis not present

## 2015-11-12 DIAGNOSIS — I10 Essential (primary) hypertension: Secondary | ICD-10-CM | POA: Diagnosis not present

## 2015-11-12 DIAGNOSIS — I70221 Atherosclerosis of native arteries of extremities with rest pain, right leg: Secondary | ICD-10-CM | POA: Diagnosis not present

## 2015-12-09 DIAGNOSIS — M3501 Sicca syndrome with keratoconjunctivitis: Secondary | ICD-10-CM | POA: Diagnosis not present

## 2015-12-23 DIAGNOSIS — H2512 Age-related nuclear cataract, left eye: Secondary | ICD-10-CM | POA: Diagnosis not present

## 2015-12-29 DIAGNOSIS — I8390 Asymptomatic varicose veins of unspecified lower extremity: Secondary | ICD-10-CM | POA: Diagnosis not present

## 2015-12-29 DIAGNOSIS — I781 Nevus, non-neoplastic: Secondary | ICD-10-CM | POA: Diagnosis not present

## 2015-12-29 DIAGNOSIS — L812 Freckles: Secondary | ICD-10-CM | POA: Diagnosis not present

## 2015-12-29 DIAGNOSIS — Z0000 Encounter for general adult medical examination without abnormal findings: Secondary | ICD-10-CM | POA: Diagnosis not present

## 2015-12-29 DIAGNOSIS — L821 Other seborrheic keratosis: Secondary | ICD-10-CM | POA: Diagnosis not present

## 2015-12-29 DIAGNOSIS — E782 Mixed hyperlipidemia: Secondary | ICD-10-CM | POA: Diagnosis not present

## 2015-12-29 DIAGNOSIS — I10 Essential (primary) hypertension: Secondary | ICD-10-CM | POA: Diagnosis not present

## 2015-12-29 DIAGNOSIS — R21 Rash and other nonspecific skin eruption: Secondary | ICD-10-CM | POA: Diagnosis not present

## 2015-12-29 DIAGNOSIS — L578 Other skin changes due to chronic exposure to nonionizing radiation: Secondary | ICD-10-CM | POA: Diagnosis not present

## 2015-12-29 DIAGNOSIS — I739 Peripheral vascular disease, unspecified: Secondary | ICD-10-CM | POA: Diagnosis not present

## 2016-01-01 ENCOUNTER — Encounter: Payer: Self-pay | Admitting: *Deleted

## 2016-01-05 DIAGNOSIS — H04221 Epiphora due to insufficient drainage, right lacrimal gland: Secondary | ICD-10-CM | POA: Diagnosis not present

## 2016-01-14 ENCOUNTER — Other Ambulatory Visit: Payer: Self-pay

## 2016-01-19 DIAGNOSIS — H04221 Epiphora due to insufficient drainage, right lacrimal gland: Secondary | ICD-10-CM | POA: Diagnosis not present

## 2016-01-25 DIAGNOSIS — H2512 Age-related nuclear cataract, left eye: Secondary | ICD-10-CM | POA: Diagnosis not present

## 2016-01-26 DIAGNOSIS — I10 Essential (primary) hypertension: Secondary | ICD-10-CM | POA: Diagnosis not present

## 2016-01-26 DIAGNOSIS — E782 Mixed hyperlipidemia: Secondary | ICD-10-CM | POA: Diagnosis not present

## 2016-02-01 ENCOUNTER — Encounter: Payer: Self-pay | Admitting: *Deleted

## 2016-02-04 DIAGNOSIS — I10 Essential (primary) hypertension: Secondary | ICD-10-CM | POA: Diagnosis not present

## 2016-02-04 DIAGNOSIS — I70212 Atherosclerosis of native arteries of extremities with intermittent claudication, left leg: Secondary | ICD-10-CM | POA: Diagnosis not present

## 2016-02-04 DIAGNOSIS — E785 Hyperlipidemia, unspecified: Secondary | ICD-10-CM | POA: Diagnosis not present

## 2016-02-04 DIAGNOSIS — I739 Peripheral vascular disease, unspecified: Secondary | ICD-10-CM | POA: Diagnosis not present

## 2016-02-04 DIAGNOSIS — I70221 Atherosclerosis of native arteries of extremities with rest pain, right leg: Secondary | ICD-10-CM | POA: Diagnosis not present

## 2016-02-04 DIAGNOSIS — I70211 Atherosclerosis of native arteries of extremities with intermittent claudication, right leg: Secondary | ICD-10-CM | POA: Diagnosis not present

## 2016-02-04 DIAGNOSIS — M79609 Pain in unspecified limb: Secondary | ICD-10-CM | POA: Diagnosis not present

## 2016-02-07 ENCOUNTER — Other Ambulatory Visit: Payer: Self-pay | Admitting: Vascular Surgery

## 2016-02-07 DIAGNOSIS — L300 Nummular dermatitis: Secondary | ICD-10-CM | POA: Diagnosis not present

## 2016-02-07 DIAGNOSIS — L280 Lichen simplex chronicus: Secondary | ICD-10-CM | POA: Diagnosis not present

## 2016-02-08 ENCOUNTER — Encounter: Admission: RE | Payer: Self-pay | Source: Ambulatory Visit

## 2016-02-08 ENCOUNTER — Ambulatory Visit: Admission: RE | Admit: 2016-02-08 | Payer: Medicare Other | Source: Ambulatory Visit | Admitting: Ophthalmology

## 2016-02-08 HISTORY — DX: Other allergy status, other than to drugs and biological substances: Z91.09

## 2016-02-08 SURGERY — PHACOEMULSIFICATION, CATARACT, WITH IOL INSERTION
Anesthesia: Choice | Laterality: Left

## 2016-02-09 ENCOUNTER — Other Ambulatory Visit
Admission: RE | Admit: 2016-02-09 | Discharge: 2016-02-09 | Disposition: A | Discharge status: Home / Self Care | Payer: Medicare Other | Source: Ambulatory Visit | Attending: Vascular Surgery | Admitting: Vascular Surgery

## 2016-02-09 DIAGNOSIS — I739 Peripheral vascular disease, unspecified: Secondary | ICD-10-CM | POA: Insufficient documentation

## 2016-02-09 LAB — CREATININE, SERUM: Creatinine, Ser: 0.77 mg/dL (ref 0.44–1.00)

## 2016-02-09 LAB — BUN: BUN: 28 mg/dL — AB (ref 6–20)

## 2016-02-10 ENCOUNTER — Encounter
Admission: RE | Disposition: A | Discharge status: Home / Self Care | Payer: Self-pay | Source: Ambulatory Visit | Attending: Vascular Surgery

## 2016-02-10 ENCOUNTER — Encounter: Payer: Self-pay | Admitting: *Deleted

## 2016-02-10 ENCOUNTER — Ambulatory Visit
Admission: RE | Admit: 2016-02-10 | Discharge: 2016-02-10 | Disposition: A | Discharge status: Home / Self Care | Payer: Medicare Other | Source: Ambulatory Visit | Attending: Vascular Surgery | Admitting: Vascular Surgery

## 2016-02-10 DIAGNOSIS — Z809 Family history of malignant neoplasm, unspecified: Secondary | ICD-10-CM | POA: Diagnosis not present

## 2016-02-10 DIAGNOSIS — I70212 Atherosclerosis of native arteries of extremities with intermittent claudication, left leg: Secondary | ICD-10-CM | POA: Insufficient documentation

## 2016-02-10 DIAGNOSIS — Z90710 Acquired absence of both cervix and uterus: Secondary | ICD-10-CM | POA: Diagnosis not present

## 2016-02-10 DIAGNOSIS — I999 Unspecified disorder of circulatory system: Secondary | ICD-10-CM | POA: Insufficient documentation

## 2016-02-10 DIAGNOSIS — Z7982 Long term (current) use of aspirin: Secondary | ICD-10-CM | POA: Insufficient documentation

## 2016-02-10 DIAGNOSIS — I10 Essential (primary) hypertension: Secondary | ICD-10-CM | POA: Diagnosis not present

## 2016-02-10 DIAGNOSIS — Z801 Family history of malignant neoplasm of trachea, bronchus and lung: Secondary | ICD-10-CM | POA: Diagnosis not present

## 2016-02-10 DIAGNOSIS — I739 Peripheral vascular disease, unspecified: Secondary | ICD-10-CM | POA: Insufficient documentation

## 2016-02-10 DIAGNOSIS — I70221 Atherosclerosis of native arteries of extremities with rest pain, right leg: Secondary | ICD-10-CM | POA: Diagnosis not present

## 2016-02-10 DIAGNOSIS — M79609 Pain in unspecified limb: Secondary | ICD-10-CM | POA: Diagnosis not present

## 2016-02-10 DIAGNOSIS — Z96651 Presence of right artificial knee joint: Secondary | ICD-10-CM | POA: Diagnosis not present

## 2016-02-10 DIAGNOSIS — I8310 Varicose veins of unspecified lower extremity with inflammation: Secondary | ICD-10-CM | POA: Diagnosis not present

## 2016-02-10 DIAGNOSIS — Z880 Allergy status to penicillin: Secondary | ICD-10-CM | POA: Diagnosis not present

## 2016-02-10 DIAGNOSIS — Z888 Allergy status to other drugs, medicaments and biological substances status: Secondary | ICD-10-CM | POA: Diagnosis not present

## 2016-02-10 DIAGNOSIS — I70211 Atherosclerosis of native arteries of extremities with intermittent claudication, right leg: Secondary | ICD-10-CM | POA: Diagnosis not present

## 2016-02-10 DIAGNOSIS — E785 Hyperlipidemia, unspecified: Secondary | ICD-10-CM | POA: Diagnosis not present

## 2016-02-10 HISTORY — PX: PERIPHERAL VASCULAR CATHETERIZATION: SHX172C

## 2016-02-10 SURGERY — LOWER EXTREMITY ANGIOGRAPHY
Anesthesia: Moderate Sedation | Site: Leg Lower | Laterality: Left

## 2016-02-10 MED ORDER — CLINDAMYCIN PHOSPHATE 300 MG/50ML IV SOLN
INTRAVENOUS | Status: AC
  Filled 2016-02-10: qty 50

## 2016-02-10 MED ORDER — HYDROMORPHONE HCL 1 MG/ML IJ SOLN: 1.0000 mg | Freq: Once | INTRAMUSCULAR | Status: DC

## 2016-02-10 MED ORDER — ACETAMINOPHEN 325 MG PO TABS: 325.0000 mg | ORAL_TABLET | ORAL | Status: DC | PRN

## 2016-02-10 MED ORDER — FAMOTIDINE 20 MG PO TABS: 40.0000 mg | ORAL_TABLET | ORAL | Status: DC | PRN

## 2016-02-10 MED ORDER — FENTANYL CITRATE (PF) 100 MCG/2ML IJ SOLN
INTRAMUSCULAR | Status: DC | PRN
  Administered 2016-02-10: 25 ug via INTRAVENOUS
  Administered 2016-02-10: 50 ug via INTRAVENOUS

## 2016-02-10 MED ORDER — MIDAZOLAM HCL 5 MG/5ML IJ SOLN
INTRAMUSCULAR | Status: AC
  Filled 2016-02-10: qty 5

## 2016-02-10 MED ORDER — IOPAMIDOL (ISOVUE-300) INJECTION 61%
INTRAVENOUS | Status: DC | PRN
  Administered 2016-02-10: 70 mL via INTRAVENOUS

## 2016-02-10 MED ORDER — HYDROMORPHONE HCL 1 MG/ML IJ SOLN: 0.5000 mg | INTRAMUSCULAR | Status: DC | PRN

## 2016-02-10 MED ORDER — LIDOCAINE-EPINEPHRINE (PF) 1 %-1:200000 IJ SOLN
INTRAMUSCULAR | Status: AC
  Filled 2016-02-10: qty 30

## 2016-02-10 MED ORDER — LABETALOL HCL 5 MG/ML IV SOLN: 10.0000 mg | INTRAVENOUS | Status: DC | PRN

## 2016-02-10 MED ORDER — FENTANYL CITRATE (PF) 100 MCG/2ML IJ SOLN
INTRAMUSCULAR | Status: AC
  Filled 2016-02-10: qty 2

## 2016-02-10 MED ORDER — HEPARIN SODIUM (PORCINE) 1000 UNIT/ML IJ SOLN
INTRAMUSCULAR | Status: AC
  Filled 2016-02-10: qty 1

## 2016-02-10 MED ORDER — ONDANSETRON HCL 4 MG/2ML IJ SOLN: 4.0000 mg | Freq: Four times a day (QID) | INTRAMUSCULAR | Status: DC | PRN

## 2016-02-10 MED ORDER — HYDRALAZINE HCL 20 MG/ML IJ SOLN: 5.0000 mg | INTRAMUSCULAR | Status: DC | PRN

## 2016-02-10 MED ORDER — MIDAZOLAM HCL 2 MG/2ML IJ SOLN
INTRAMUSCULAR | Status: DC | PRN
  Administered 2016-02-10: 1 mg via INTRAVENOUS
  Administered 2016-02-10: 2 mg via INTRAVENOUS

## 2016-02-10 MED ORDER — ACETAMINOPHEN 325 MG RE SUPP
325.0000 mg | RECTAL | Status: DC | PRN
Start: 2016-02-10 — End: 2016-02-10
  Filled 2016-02-10: qty 2

## 2016-02-10 MED ORDER — PHENOL 1.4 % MT LIQD: 1.0000 | OROMUCOSAL | Status: DC | PRN

## 2016-02-10 MED ORDER — METOPROLOL TARTRATE 5 MG/5ML IV SOLN: 2.0000 mg | INTRAVENOUS | Status: DC | PRN

## 2016-02-10 MED ORDER — HEPARIN SODIUM (PORCINE) 1000 UNIT/ML IJ SOLN
INTRAMUSCULAR | Status: DC | PRN
  Administered 2016-02-10: 4000 [IU] via INTRAVENOUS

## 2016-02-10 MED ORDER — GUAIFENESIN-DM 100-10 MG/5ML PO SYRP: 15.0000 mL | ORAL_SOLUTION | ORAL | Status: DC | PRN

## 2016-02-10 MED ORDER — SODIUM CHLORIDE 0.9 % IV SOLN
INTRAVENOUS | Status: DC
Start: 2016-02-10 — End: 2016-02-10

## 2016-02-10 MED ORDER — METHYLPREDNISOLONE SODIUM SUCC 125 MG IJ SOLR: 125.0000 mg | INTRAMUSCULAR | Status: DC | PRN

## 2016-02-10 MED ORDER — OXYCODONE-ACETAMINOPHEN 5-325 MG PO TABS: 1.0000 | ORAL_TABLET | ORAL | Status: DC | PRN

## 2016-02-10 MED ORDER — CLINDAMYCIN PHOSPHATE 300 MG/50ML IV SOLN: 300.0000 mg | Freq: Once | INTRAVENOUS | Status: DC

## 2016-02-10 MED ORDER — SODIUM CHLORIDE 0.9 % IV SOLN
500.0000 mL | Freq: Once | INTRAVENOUS | Status: DC | PRN
Start: 2016-02-10 — End: 2016-02-10

## 2016-02-10 SURGICAL SUPPLY — 16 items
BALLN LUTONIX 5X150X130 (BALLOONS) ×3
BALLN LUTONIX DCB 5X100X130 (BALLOONS) ×3
BALLOON LUTONIX 5X150X130 (BALLOONS) ×2 IMPLANT
BALLOON LUTONIX DCB 5X100X130 (BALLOONS) ×2 IMPLANT
CATH PIG 70CM (CATHETERS) ×3 IMPLANT
CATH VERT 100CM (CATHETERS) ×3 IMPLANT
DEVICE PRESTO INFLATION (MISCELLANEOUS) ×3 IMPLANT
DEVICE STARCLOSE SE CLOSURE (Vascular Products) ×3 IMPLANT
GLIDEWIRE ADV .035X260CM (WIRE) ×3 IMPLANT
LIFESTENT 6X100X130 (Permanent Stent) ×3 IMPLANT
PACK ANGIOGRAPHY (CUSTOM PROCEDURE TRAY) ×3 IMPLANT
SHEATH ANL2 6FRX45 HC (SHEATH) ×3 IMPLANT
SHEATH BRITE TIP 5FRX11 (SHEATH) ×3 IMPLANT
SYR MEDRAD MARK V 150ML (SYRINGE) ×3 IMPLANT
TUBING CONTRAST HIGH PRESS 72 (TUBING) ×3 IMPLANT
WIRE J 3MM .035X145CM (WIRE) ×3 IMPLANT

## 2016-02-10 NOTE — H&P (Signed)
[facility] History & Physical Update  The patient was interviewed and re-examined.  The patient's previous History and Physical has been reviewed and is unchanged.  There is no change in the plan of care. We plan to proceed with the scheduled procedure.  Sharmain Lastra, MD  02/10/2016, 8:03 AM

## 2016-02-10 NOTE — Op Note (Signed)
[name] Percutaneous Study/Intervention Procedural Note   Date of Surgery: 02/10/2016  Surgeon(s):Loriel Diehl   Assistants:none  Pre-operative Diagnosis: PAD with claudication left lower extremity  Post-operative diagnosis: Same  Procedure(s) Performed: 1. Ultrasound guidance for vascular access right femoral artery 2. Catheter placement into left popliteal artery from right femoral approach 3. Aortogram and selective left lower extremity angiogram 4. Percutaneous transluminal angioplasty of left mid superficial femoral artery with 5 mm diameter by 15 cm length Lutonix drug-coated angioplasty balloon 5. Self-expanding stent placement to the left mid superficial femoral artery with 6 mm diameter by 10 cm length life stent for dissection and greater than 50% stenosis after angioplasty  6.  Percutaneous transluminal angioplasty of the left distal external iliac artery, common femoral artery, and origin of the superficial femoral artery with 5 mm diameter by 10 cm length Lutonix drug-coated balloon 7. StarClose closure device right femoral artery  EBL: Minimal  Contrast: 70 cc  Fluoro Time: 3.7 minutes  Moderate Conscious Sedation Time: approximately 30 minutes using 3 mg of Versed and 75 mcg of Fentanyl  Indications: Patient is a 81 y.o.female with worsening claudication symptoms in the left leg as well as some recurrent claudication symptoms in the right leg. The patient has noninvasive study showing severe disease in the left superficial femoral artery with a markedly reduced left ABI as well as what appeared to be moderate stenosis in the right SFA which was recurrent disease. The patient is brought in for angiography for further evaluation and potential treatment. Risks and benefits are discussed and informed consent is obtained  Procedure: The patient was  identified and appropriate procedural time out was performed. The patient was then placed supine on the table and prepped and draped in the usual sterile fashion.Moderate conscious sedation was administered during a face to face encounter with the patient throughout the procedure with my supervision of the RN administering medicines and monitoring the patient's vital signs, pulse oximetry, telemetry and mental status throughout from the start of the procedure until the patient was taken to the recovery room. Ultrasound was used to evaluate the right common femoral artery. It was patent . A digital ultrasound image was acquired. A Seldinger needle was used to access the right common femoral artery under direct ultrasound guidance and a permanent image was performed. A 0.035 J wire was advanced without resistance and a 5Fr sheath was placed. Pigtail catheter was placed into the aorta and an AP aortogram was performed. This demonstrated what appeared to be at least a mild right renal artery stenosis but not a high-grade stenosis. Left renal artery appeared widely patent. Aorta was small but patent. Right iliac system was widely patent. The distal left external iliac artery transitioning down into the common femoral artery and proximal SFA had a hyperplastic lesion that appeared to be greater than 50% stenosis with its worst stenosis at the origin of the SFA being about 75-80%. I then crossed the aortic bifurcation and advanced to the left femoral head. Selective left lower extremity angiogram was then performed. This demonstrated the SFA origin stenosis of 75-80%. The SFA then normalized for about 8-10 cm and had a segment of moderate to high-grade stenosis immediately followed by a short segment occlusion with reconstitution only a centimeter or so beyond the occlusion in the mid superficial femoral artery. There was then about a 20-30% stenosis in the popliteal artery and two-vessel runoff distally. The  patient was systemically heparinized and a 6 [place] Ansell sheath was  then placed over the [employer] wire. I then used a Kumpe catheter and the advantage wire to navigate through the disease in the external iliac artery, common femoral artery, and superficial femoral artery and confirm intraluminal flow in the popliteal artery above the knee with a Kumpe catheter. The advantage wire was then replaced. I treated the mid SFA lesion with a 5 mm diameter by 15 cm length Lutonix drug-coated angioplasty balloon inflated to 8 atm for 1 minute. Completion angiogram showed significant residual stenosis of greater than 50% and dissection over several centimeters. I elected to cover this area with a stent. A 6 mm diameter by 10 cm length life stent was deployed in the mid superficial femoral artery. To treat the external iliac, common femoral, and origin of the SFA a 5 mm diameter by 10 cm length Lutonix drug-coated angioplasty balloon was selected. This was inflated to 10 atm for 1 minute. The same balloon was then advanced and used to post dilate the stent in the mid SFA and inflated to 8 atm. Completion angiogram showed less than 20% residual stenosis in the external iliac artery, common femoral artery, origin of the SFA, and no significant residual stenosis in the mid SFA stent. I elected to terminate the procedure. The sheath was removed and StarClose closure device was deployed in the right femoral artery with excellent hemostatic result. The patient was taken to the recovery room in stable condition having tolerated the procedure well.  Findings:  Aortogram: What appeared to be at least a mild right renal artery stenosis but not a high-grade stenosis. Left renal artery appeared widely patent. Aorta was small but patent. Right iliac system was widely patent. The distal left external iliac artery transitioning down into the common femoral artery and proximal SFA had a hyperplastic lesion that  appeared to be greater than 50% stenosis with its worst stenosis at the origin of the SFA being about 75-80%. Left Lower Extremity: This demonstrated the SFA origin stenosis of 75-80%. The SFA then normalized for about 8-10 cm and had a segment of moderate to high-grade stenosis immediately followed by a short segment occlusion with reconstitution only a centimeter or so beyond the occlusion in the mid superficial femoral artery. There was then about a 20-30% stenosis in the popliteal artery and two-vessel runoff distally.   Disposition: Patient was taken to the recovery room in stable condition having tolerated the procedure well.  Complications: None  Ashley Hill 02/10/2016 8:50 AM

## 2016-02-11 ENCOUNTER — Encounter: Payer: Self-pay | Admitting: Vascular Surgery

## 2016-02-15 ENCOUNTER — Other Ambulatory Visit: Payer: Self-pay | Admitting: Vascular Surgery

## 2016-02-15 ENCOUNTER — Encounter (INDEPENDENT_AMBULATORY_CARE_PROVIDER_SITE_OTHER): Payer: Self-pay

## 2016-02-15 ENCOUNTER — Telehealth (INDEPENDENT_AMBULATORY_CARE_PROVIDER_SITE_OTHER): Payer: Self-pay | Admitting: Vascular Surgery

## 2016-02-15 NOTE — Telephone Encounter (Signed)
Patient had a LLE angio on 02/10/16

## 2016-02-15 NOTE — Telephone Encounter (Signed)
Put on for RLE angiogram on Monday, 10/9

## 2016-02-15 NOTE — Telephone Encounter (Signed)
Pt is calling and stated she is having numbness and tingling on right leg and can't feel her toes, pt need to speak to nurse. Requesting a call back [phone]

## 2016-02-18 ENCOUNTER — Telehealth (INDEPENDENT_AMBULATORY_CARE_PROVIDER_SITE_OTHER): Payer: Self-pay

## 2016-02-18 NOTE — Telephone Encounter (Signed)
Patient called and canceled her procedure scheduled 02/21/16 because she has more questions and wanted to see the doctor before continuing. Her daughter called wanting to know if 02/29/16 was to long for her to wait to be seen because she has numbness in her foot. I let the daughter that she can be scheduled for 02/24/16 but her mother would have to call and schedule that.

## 2016-02-21 ENCOUNTER — Other Ambulatory Visit (INDEPENDENT_AMBULATORY_CARE_PROVIDER_SITE_OTHER): Payer: Self-pay | Admitting: Vascular Surgery

## 2016-02-21 ENCOUNTER — Ambulatory Visit: Admit: 2016-02-21 | Payer: Medicare Other | Admitting: Vascular Surgery

## 2016-02-21 ENCOUNTER — Telehealth (INDEPENDENT_AMBULATORY_CARE_PROVIDER_SITE_OTHER): Payer: Self-pay

## 2016-02-21 SURGERY — LOWER EXTREMITY ANGIOGRAPHY
Anesthesia: Moderate Sedation | Site: Leg Lower | Laterality: Right

## 2016-02-21 NOTE — Telephone Encounter (Signed)
Patient called wanting to know what time her procedure was scheduled for. I gave that information and also explained to be npo 8 hours before her procedure and to have lab work drawn at the hospital on Wednesday the day before.

## 2016-02-23 ENCOUNTER — Other Ambulatory Visit
Admission: RE | Admit: 2016-02-23 | Discharge: 2016-02-23 | Disposition: A | Discharge status: Home / Self Care | Payer: Medicare Other | Source: Ambulatory Visit | Attending: Vascular Surgery | Admitting: Vascular Surgery

## 2016-02-23 DIAGNOSIS — I739 Peripheral vascular disease, unspecified: Secondary | ICD-10-CM | POA: Diagnosis not present

## 2016-02-23 LAB — CREATININE, SERUM
Creatinine, Ser: 0.92 mg/dL (ref 0.44–1.00)
GFR calc non Af Amer: 58 mL/min — ABNORMAL LOW (ref >60)

## 2016-02-23 LAB — BUN: BUN: 32 mg/dL — ABNORMAL HIGH (ref 6–20)

## 2016-02-24 ENCOUNTER — Encounter
Admission: RE | Disposition: A | Discharge status: Home / Self Care | Payer: Self-pay | Source: Ambulatory Visit | Attending: Vascular Surgery

## 2016-02-24 ENCOUNTER — Encounter: Payer: Self-pay | Admitting: Certified Registered Nurse Anesthetist

## 2016-02-24 ENCOUNTER — Ambulatory Visit
Admission: RE | Admit: 2016-02-24 | Discharge: 2016-02-24 | Disposition: A | Discharge status: Home / Self Care | Payer: Medicare Other | Source: Ambulatory Visit | Attending: Vascular Surgery | Admitting: Vascular Surgery

## 2016-02-24 DIAGNOSIS — I739 Peripheral vascular disease, unspecified: Secondary | ICD-10-CM | POA: Diagnosis not present

## 2016-02-24 DIAGNOSIS — Z888 Allergy status to other drugs, medicaments and biological substances status: Secondary | ICD-10-CM | POA: Diagnosis not present

## 2016-02-24 DIAGNOSIS — M79609 Pain in unspecified limb: Secondary | ICD-10-CM | POA: Diagnosis not present

## 2016-02-24 DIAGNOSIS — I70221 Atherosclerosis of native arteries of extremities with rest pain, right leg: Secondary | ICD-10-CM | POA: Diagnosis not present

## 2016-02-24 DIAGNOSIS — Z90710 Acquired absence of both cervix and uterus: Secondary | ICD-10-CM | POA: Insufficient documentation

## 2016-02-24 DIAGNOSIS — R58 Hemorrhage, not elsewhere classified: Secondary | ICD-10-CM | POA: Diagnosis not present

## 2016-02-24 DIAGNOSIS — I70228 Atherosclerosis of native arteries of extremities with rest pain, other extremity: Secondary | ICD-10-CM | POA: Insufficient documentation

## 2016-02-24 DIAGNOSIS — Z7982 Long term (current) use of aspirin: Secondary | ICD-10-CM | POA: Diagnosis not present

## 2016-02-24 DIAGNOSIS — Z809 Family history of malignant neoplasm, unspecified: Secondary | ICD-10-CM | POA: Insufficient documentation

## 2016-02-24 DIAGNOSIS — I999 Unspecified disorder of circulatory system: Secondary | ICD-10-CM | POA: Insufficient documentation

## 2016-02-24 DIAGNOSIS — Z880 Allergy status to penicillin: Secondary | ICD-10-CM | POA: Insufficient documentation

## 2016-02-24 DIAGNOSIS — E785 Hyperlipidemia, unspecified: Secondary | ICD-10-CM | POA: Diagnosis not present

## 2016-02-24 DIAGNOSIS — I10 Essential (primary) hypertension: Secondary | ICD-10-CM | POA: Diagnosis not present

## 2016-02-24 DIAGNOSIS — Z801 Family history of malignant neoplasm of trachea, bronchus and lung: Secondary | ICD-10-CM | POA: Diagnosis not present

## 2016-02-24 DIAGNOSIS — Z96651 Presence of right artificial knee joint: Secondary | ICD-10-CM | POA: Insufficient documentation

## 2016-02-24 HISTORY — PX: PERIPHERAL VASCULAR CATHETERIZATION: SHX172C

## 2016-02-24 SURGERY — LOWER EXTREMITY ANGIOGRAPHY
Anesthesia: Moderate Sedation | Site: Leg Lower | Laterality: Right

## 2016-02-24 MED ORDER — HEPARIN SODIUM (PORCINE) 1000 UNIT/ML IJ SOLN
INTRAMUSCULAR | Status: AC
  Filled 2016-02-24: qty 1

## 2016-02-24 MED ORDER — CLINDAMYCIN PHOSPHATE 300 MG/50ML IV SOLN: 300.0000 mg | Freq: Once | INTRAVENOUS | Status: DC

## 2016-02-24 MED ORDER — LABETALOL HCL 5 MG/ML IV SOLN: 10.0000 mg | INTRAVENOUS | Status: DC | PRN

## 2016-02-24 MED ORDER — SODIUM CHLORIDE 0.9 % IJ SOLN
INTRAMUSCULAR | Status: AC
  Filled 2016-02-24: qty 50

## 2016-02-24 MED ORDER — FAMOTIDINE 20 MG PO TABS: 40.0000 mg | ORAL_TABLET | ORAL | Status: DC | PRN

## 2016-02-24 MED ORDER — METHYLPREDNISOLONE SODIUM SUCC 125 MG IJ SOLR: 125.0000 mg | INTRAMUSCULAR | Status: DC | PRN

## 2016-02-24 MED ORDER — ACETAMINOPHEN 325 MG PO TABS: 325.0000 mg | ORAL_TABLET | ORAL | Status: DC | PRN

## 2016-02-24 MED ORDER — OXYCODONE-ACETAMINOPHEN 5-325 MG PO TABS: 1.0000 | ORAL_TABLET | ORAL | Status: DC | PRN

## 2016-02-24 MED ORDER — SODIUM CHLORIDE FLUSH 0.9 % IV SOLN
INTRAVENOUS | Status: AC
  Filled 2016-02-24: qty 20

## 2016-02-24 MED ORDER — DIPHENHYDRAMINE HCL 50 MG/ML IJ SOLN
INTRAMUSCULAR | Status: DC | PRN
  Administered 2016-02-24: 50 mg via INTRAVENOUS

## 2016-02-24 MED ORDER — TRAMADOL HCL 50 MG PO TABS: 50.0000 mg | ORAL_TABLET | Freq: Four times a day (QID) | ORAL | 0 refills | Status: DC | PRN

## 2016-02-24 MED ORDER — HYDRALAZINE HCL 20 MG/ML IJ SOLN: 5.0000 mg | INTRAMUSCULAR | Status: DC | PRN

## 2016-02-24 MED ORDER — CLINDAMYCIN PHOSPHATE 300 MG/50ML IV SOLN
INTRAVENOUS | Status: AC
Start: 2016-02-24 — End: 2016-02-24
  Administered 2016-02-24: 300 mg
  Filled 2016-02-24: qty 50

## 2016-02-24 MED ORDER — HEPARIN SODIUM (PORCINE) 1000 UNIT/ML IJ SOLN
INTRAMUSCULAR | Status: DC | PRN
  Administered 2016-02-24: 5000 [IU] via INTRAVENOUS

## 2016-02-24 MED ORDER — LABETALOL HCL 5 MG/ML IV SOLN
INTRAVENOUS | Status: DC | PRN
  Administered 2016-02-24: 10 mg via INTRAVENOUS

## 2016-02-24 MED ORDER — LIDOCAINE-EPINEPHRINE (PF) 1 %-1:200000 IJ SOLN
INTRAMUSCULAR | Status: AC
  Filled 2016-02-24: qty 30

## 2016-02-24 MED ORDER — DIPHENHYDRAMINE HCL 50 MG/ML IJ SOLN
INTRAMUSCULAR | Status: AC
  Filled 2016-02-24: qty 1

## 2016-02-24 MED ORDER — PHENOL 1.4 % MT LIQD: 1.0000 | OROMUCOSAL | Status: DC | PRN

## 2016-02-24 MED ORDER — NITROGLYCERIN 5 MG/ML IV SOLN
INTRAVENOUS | Status: AC
  Filled 2016-02-24: qty 10

## 2016-02-24 MED ORDER — ACETAMINOPHEN 325 MG RE SUPP: 325.0000 mg | RECTAL | Status: DC | PRN

## 2016-02-24 MED ORDER — FENTANYL CITRATE (PF) 100 MCG/2ML IJ SOLN
INTRAMUSCULAR | Status: AC
  Filled 2016-02-24: qty 4

## 2016-02-24 MED ORDER — HYDROMORPHONE HCL 1 MG/ML IJ SOLN: 1.0000 mg | Freq: Once | INTRAMUSCULAR | Status: DC

## 2016-02-24 MED ORDER — HYDRALAZINE HCL 20 MG/ML IJ SOLN
INTRAMUSCULAR | Status: DC | PRN
  Administered 2016-02-24: 10 mg via INTRAVENOUS

## 2016-02-24 MED ORDER — HYDROMORPHONE HCL 1 MG/ML IJ SOLN: 0.5000 mg | INTRAMUSCULAR | Status: DC | PRN

## 2016-02-24 MED ORDER — LABETALOL HCL 5 MG/ML IV SOLN
INTRAVENOUS | Status: AC
  Filled 2016-02-24: qty 4

## 2016-02-24 MED ORDER — SODIUM CHLORIDE 0.9 % IV SOLN: 500.0000 mL | Freq: Once | INTRAVENOUS | Status: DC | PRN

## 2016-02-24 MED ORDER — METOPROLOL TARTRATE 5 MG/5ML IV SOLN: 2.0000 mg | INTRAVENOUS | Status: DC | PRN

## 2016-02-24 MED ORDER — ONDANSETRON HCL 4 MG/2ML IJ SOLN: 4.0000 mg | Freq: Four times a day (QID) | INTRAMUSCULAR | Status: DC | PRN

## 2016-02-24 MED ORDER — GUAIFENESIN-DM 100-10 MG/5ML PO SYRP: 15.0000 mL | ORAL_SOLUTION | ORAL | Status: DC | PRN

## 2016-02-24 MED ORDER — HYDRALAZINE HCL 20 MG/ML IJ SOLN
INTRAMUSCULAR | Status: AC
  Filled 2016-02-24: qty 1

## 2016-02-24 MED ORDER — MIDAZOLAM HCL 5 MG/5ML IJ SOLN
INTRAMUSCULAR | Status: AC
  Filled 2016-02-24: qty 5

## 2016-02-24 MED ORDER — IOPAMIDOL (ISOVUE-300) INJECTION 61%
INTRAVENOUS | Status: DC | PRN
  Administered 2016-02-24: 80 mL via INTRA_ARTERIAL

## 2016-02-24 MED ORDER — MIDAZOLAM HCL 2 MG/2ML IJ SOLN
INTRAMUSCULAR | Status: DC | PRN
  Administered 2016-02-24 (×2): 1 mg via INTRAVENOUS
  Administered 2016-02-24: 0.5 mg via INTRAVENOUS
  Administered 2016-02-24: 2 mg via INTRAVENOUS
  Administered 2016-02-24: 0.5 mg via INTRAVENOUS

## 2016-02-24 MED ORDER — ALTEPLASE 2 MG IJ SOLR
INTRAMUSCULAR | Status: AC
Start: 2016-02-24 — End: 2016-02-24
  Filled 2016-02-24: qty 8

## 2016-02-24 MED ORDER — SODIUM CHLORIDE 0.9 % IV SOLN: INTRAVENOUS | Status: DC

## 2016-02-24 MED ORDER — FENTANYL CITRATE (PF) 100 MCG/2ML IJ SOLN
INTRAMUSCULAR | Status: DC | PRN
  Administered 2016-02-24: 50 ug via INTRAVENOUS
  Administered 2016-02-24 (×2): 25 ug via INTRAVENOUS
  Administered 2016-02-24: 50 ug via INTRAVENOUS
  Administered 2016-02-24: 25 ug via INTRAVENOUS

## 2016-02-24 SURGICAL SUPPLY — 22 items
BALLN LUTONIX 5X150X130 (BALLOONS) ×4
BALLN ULTRVRSE 6X80X130C (BALLOONS) ×2
BALLOON LUTONIX 5X150X130 (BALLOONS) ×2 IMPLANT
BALLOON ULTRVRSE 6X80X130C (BALLOONS) ×1 IMPLANT
CATH PIG 70CM (CATHETERS) ×2 IMPLANT
CATH RIM 65CM (CATHETERS) ×2 IMPLANT
CATH VERT 100CM (CATHETERS) ×2 IMPLANT
DEVICE PRESTO INFLATION (MISCELLANEOUS) ×2 IMPLANT
DEVICE SOLENT OMNI 120CM (CATHETERS) ×2 IMPLANT
DEVICE STARCLOSE SE CLOSURE (Vascular Products) ×2 IMPLANT
GLIDEWIRE ADV .035X260CM (WIRE) ×2 IMPLANT
IV NS 100ML SINGLE PACK (IV SOLUTION) ×2 IMPLANT
PACK ANGIOGRAPHY (CUSTOM PROCEDURE TRAY) ×2 IMPLANT
SHEATH ANL2 6FRX45 HC (SHEATH) ×2 IMPLANT
SHEATH BRITE TIP 4FRX11 (SHEATH) ×2 IMPLANT
SHEATH BRITE TIP 5FRX11 (SHEATH) ×2 IMPLANT
SHIELD RADPAD SCOOP 12X17 (MISCELLANEOUS) ×2 IMPLANT
STENT VIABAHN 6X250X120 (Permanent Stent) ×2 IMPLANT
SYR MEDRAD MARK V 150ML (SYRINGE) ×2 IMPLANT
TUBING CONTRAST HIGH PRESS 72 (TUBING) ×2 IMPLANT
WIRE G V18X300CM (WIRE) ×2 IMPLANT
WIRE J 3MM .035X145CM (WIRE) ×2 IMPLANT

## 2016-02-24 NOTE — H&P (Signed)
[facility] History & Physical Update  The patient was interviewed and re-examined.  The patient's previous History and Physical has been reviewed and is unchanged.  There is no change in the plan of care. We plan to proceed with the scheduled procedure.  Festus BarrenJason Dew, MD  02/24/2016, 9:48 AM

## 2016-02-24 NOTE — Discharge Instructions (Signed)
Angiogram, Care After °Refer to this sheet in the next few weeks. These instructions provide you with information about caring for yourself after your procedure. Your health care provider may also give you more specific instructions. Your treatment has been planned according to current medical practices, but problems sometimes occur. Call your health care provider if you have any problems or questions after your procedure. °WHAT TO EXPECT AFTER THE PROCEDURE °After your procedure, it is typical to have the following: °· Bruising at the catheter insertion site that usually fades within 1-2 weeks. °· Blood collecting in the tissue (hematoma) that may be painful to the touch. It should usually decrease in size and tenderness within 1-2 weeks. °HOME CARE INSTRUCTIONS °· Take medicines only as directed by your health care provider. °· You may shower 24-48 hours after the procedure or as directed by your health care provider. Remove the bandage (dressing) and gently wash the site with plain soap and water. Pat the area dry with a clean towel. Do not rub the site, because this may cause bleeding. °· Do not take baths, swim, or use a hot tub until your health care provider approves. °· Check your insertion site every day for redness, swelling, or drainage. °· Do not apply powder or lotion to the site. °· Do not lift over 10 lb (4.5 kg) for 5 days after your procedure or as directed by your health care provider. °· Ask your health care provider when it is okay to: °¨ Return to work or school. °¨ Resume usual physical activities or sports. °¨ Resume sexual activity. °· Do not drive home if you are discharged the same day as the procedure. Have someone else drive you. °· You may drive 24 hours after the procedure unless otherwise instructed by your health care provider. °· Do not operate machinery or power tools for 24 hours after the procedure or as directed by your health care provider. °· If your procedure was done as an  outpatient procedure, which means that you went home the same day as your procedure, a responsible adult should be with you for the first 24 hours after you arrive home. °· Keep all follow-up visits as directed by your health care provider. This is important. °SEEK MEDICAL CARE IF: °· You have a fever. °· You have chills. °· You have increased bleeding from the catheter insertion site. Hold pressure on the site. °SEEK IMMEDIATE MEDICAL CARE IF: °· You have unusual pain at the catheter insertion site. °· You have redness, warmth, or swelling at the catheter insertion site. °· You have drainage (other than a small amount of blood on the dressing) from the catheter insertion site. °· The catheter insertion site is bleeding, and the bleeding does not stop after 30 minutes of holding steady pressure on the site. °· The area near or just beyond the catheter insertion site becomes pale, cool, tingly, or numb. °  °This information is not intended to replace advice given to you by your health care provider. Make sure you discuss any questions you have with your health care provider. °  °Document Released: 11/17/2004 Document Revised: 05/22/2014 Document Reviewed: 10/02/2012 °Elsevier Interactive Patient Education ©2016 [employer] ° °

## 2016-02-24 NOTE — Op Note (Signed)
[facility] Percutaneous Study/Intervention Procedural Note   Date of Surgery: 02/24/2016  Surgeon(s):Garin Mata   Assistants:none  Pre-operative Diagnosis: PAD with rest Hill of the right lower extremity  Post-operative diagnosis: Same  Procedure(s) Performed: 1. Ultrasound guidance for vascular access left femoral artery 2. Catheter placement into right anterior tibial artery from left femoral approach 3. Selective right lower extremity angiogram 4. Catheter directed thrombolytic therapy with 8 mg of TPA instilled in the right SFA, popliteal, and anterior tibial arteries with the AngioJet Omni catheter 5. Mechanical rheolytic thrombectomy to the right SFA, popliteal, and anterior tibial arteries with the AngioJet Omni catheter  6.  Percutaneous transluminal angioplasty of the right anterior tibial artery 3 mm diameter angioplasty balloon  7.  Percutaneous transluminal angioplasty of entire SFA and popliteal artery with 5 mm diameter angioplasty balloons including Lutonix drug-coated balloon proximally and distally and a 6 mm balloon proximally as well  8.  Viabahn covered stent placement to the distal right SFA and popliteal artery for greater than 50% residual stenosis and thrombosis after angioplasty using a 6 mm diameter by 25 cm length stent 9. StarClose closure device left femoral artery  EBL: 15 cc  Contrast: 80 cc  Fluoro Time: 9.1 minutes  Moderate Conscious Sedation Time: approximately 60 minutes using 5 mg of Versed and 175 mcg of Fentanyl  Indications: Patient is a 81 y.o.female with recurrent rest Hill of the right lower extremity. The patient has noninvasive study showing some stenosis in the right SFA, but her symptoms have markedly worsened since that time and after intervention on the left leg through a right femoral approach. The patient is brought  in for angiography for further evaluation and potential treatment. Risks and benefits are discussed and informed consent is obtained  Procedure: The patient was identified and appropriate procedural time out was performed. The patient was then placed supine on the table and prepped and draped in the usual sterile fashion.Moderate conscious sedation was administered during a face to face encounter with the patient throughout the procedure with my supervision of the RN administering medicines and monitoring the patient's vital signs, pulse oximetry, telemetry and mental status throughout from the start of the procedure until the patient was taken to the recovery room. Ultrasound was used to evaluate the left common femoral artery. It was patent . A digital ultrasound image was acquired. A Seldinger needle was used to access the left common femoral artery under direct ultrasound guidance and a permanent image was performed. A 0.035 J wire was advanced without resistance and a 5Fr sheath was placed.Aortogram was deferred due to a recent aortogram showing no proximal stenoses. I then crossed the aortic bifurcation and advanced to the right femoral head. Selective right lower extremity angiogram was then performed. This demonstrated occlusion of entire SFA with only proximal to mid AT reconstituting distally.  With this being open 2-3 weeks ago, this was clearly acute on chronic disease. The patient was systemically heparinized and a 6 [place] Ansell sheath was then placed over the [employer] wire. I then used a Kumpe catheter and the advantage wire to navigate through the occlusion with no difficulty likely due to the more acute nature of the occlusion. I confirmed intraluminal flow in the anterior tibial artery distally. I then instilled 8 mg of TPA with the AngioJet on the catheter for catheter directed thrombolytic therapy from the origin of the SFA throughout the entire SFA and popliteal arteries and  into the anterior tibial artery  to its midsegment. This was allowed to dwell for 10-15 minutes. Mechanical rheolytic thrombectomy was performed with 2 passes of the AngioJet catheter throughout the SFA, popliteal, and anterior tibial arteries for about 180 cc of effluent returned. Stenosis and thrombosis persisted. The anterior tibial artery was treated with a 3 mm diameter by 15 cm length angioplasty balloon proximally inflated to 12 atm for 1 minute. I selected 25 mm diameter by 15 cm length Lutonix drug-coated angioplasty balloons using the drug-coated portions in the popliteal artery and at the origin of the SFA. A total of 4 inflations were performed with these 2 balloons and each inflation was from 8-12 atm for 1 minute. The proximal SFA required a 6 mm diameter by 8 cm balloon as well and there was some nonflow limiting dissection and thrombus creating a less than 30% stenosis residual in the proximal SFA. The previously placed stent in the SFA was widely patent. The popliteal artery both above and below the knee had residual thrombus and stenosis of greater than 50%. The anterior tibial artery was patent with only about a 20-30% residual stenosis in the proximal segment. I exchanged for a 0.018 wire and placed a Viabahn covered stent from the below-knee popliteal artery up to the previously placed stent in the mid SFA. This was postdilated with a 5 mm balloon with excellent angiographic completion result and no greater than 20% residual stenosis and brisk flow distally. I elected to terminate the procedure. The sheath was removed and StarClose closure device was deployed in the left femoral artery with excellent hemostatic result. The patient was taken to the recovery room in stable condition having tolerated the procedure well.  Findings:   Right Lower Extremity: occlusion of entire SFA with only proximal to mid AT reconstituting distally.  With this being open 2-3 weeks ago,  this was clearly acute on chronic disease.   Disposition: Patient was taken to the recovery room in stable condition having tolerated the procedure well.  Complications: None  Ashley Hill 02/24/2016 1:07 PM   This note was created with Dragon Medical transcription system. Any errors in dictation are purely unintentional.

## 2016-02-25 ENCOUNTER — Encounter: Payer: Self-pay | Admitting: Vascular Surgery

## 2016-02-29 ENCOUNTER — Encounter (INDEPENDENT_AMBULATORY_CARE_PROVIDER_SITE_OTHER): Payer: Self-pay | Admitting: Vascular Surgery

## 2016-02-29 ENCOUNTER — Ambulatory Visit (INDEPENDENT_AMBULATORY_CARE_PROVIDER_SITE_OTHER): Payer: Medicare Other | Admitting: Vascular Surgery

## 2016-02-29 VITALS — BP 99/44 | Resp 16 | Ht 65.0 in | Wt 145.0 lb

## 2016-02-29 DIAGNOSIS — I739 Peripheral vascular disease, unspecified: Secondary | ICD-10-CM | POA: Diagnosis not present

## 2016-02-29 DIAGNOSIS — I10 Essential (primary) hypertension: Secondary | ICD-10-CM

## 2016-02-29 DIAGNOSIS — E785 Hyperlipidemia, unspecified: Secondary | ICD-10-CM

## 2016-02-29 NOTE — Assessment & Plan Note (Signed)
Patient has recently undergone bilateral lower extremity revascularization procedures. Her feet are warm and she has palpable pedal pulses. She has some reperfusion swelling a little worse in the right leg and the left, and this is not entirely surprising. She has very severe recurrent disease and she has been maintained on full anticoagulation which I believe should continue. She has some common femoral disease bilaterally that may eventually need to be addressed surgically, but does not appear to need addressed currently. She has a follow-up appointment in about 2-3 weeks with noninvasive studies that she will keep.

## 2016-02-29 NOTE — Assessment & Plan Note (Signed)
blood pressure control important in reducing the progression of atherosclerotic disease. On appropriate oral medications.  

## 2016-02-29 NOTE — Assessment & Plan Note (Signed)
lipid control important in reducing the progression of atherosclerotic disease. Continue statin therapy  

## 2016-02-29 NOTE — Progress Notes (Signed)
MRN : [id number]  Ashley Hill is a 81 y.o. (1934/07/06) female who presents with chief complaint of  Chief Complaint  Patient presents with  . Re-evaluation    3 day follow up  .  History of Present Illness: Patient returns today in follow up of Recent revascularization procedures of both lower extremities. Patient is complaining of dizziness, numbness in her foot, and is having sever diarrhea. Her blood pressure is on the low side and she feels faint. She is complaining more swelling than anything else in her feet. The rest pain symptoms she was having previously had resolved.  Current Outpatient Prescriptions  Medication Sig Dispense Refill  . apixaban (ELIQUIS) 5 MG TABS tablet Take 1 tablet (5 mg total) by mouth 2 (two) times daily. 60 tablet 2  . aspirin 81 MG tablet Take 81 mg by mouth daily.    Marland Kitchen b complex vitamins tablet Take 1 tablet by mouth daily.    Marland Kitchen CALCIUM PO Take 750 mg by mouth 2 (two) times daily.    . carvedilol (COREG) 25 MG tablet Take 25 mg by mouth 2 (two) times daily with a meal.    . cholecalciferol (VITAMIN D) 1000 units tablet Take 2,000 Units by mouth daily.    . Coenzyme Q10 (COQ10) 50 MG CAPS Take 1 capsule by mouth daily.    . CRESTOR 5 MG tablet TAKE 1 TABLET BY MOUTH EVERY DAY 30 tablet 6  . glucosamine-chondroitin 500-400 MG tablet Take 1,500 tablets by mouth 2 (two) times daily. Reported on 10/12/2015    . hydrochlorothiazide (HYDRODIURIL) 25 MG tablet TAKE 1 TABLET BY MOUTH EVERY DAY 30 tablet 0  . hydrochlorothiazide (HYDRODIURIL) 25 MG tablet TAKE 1 TABLET BY MOUTH EVERY DAY 30 tablet 3  . ibuprofen (ADVIL,MOTRIN) 200 MG tablet Take 200 mg by mouth every 6 (six) hours as needed.    . irbesartan (AVAPRO) 300 MG tablet Take 1 tablet (300 mg total) by mouth daily. 30 tablet 6  . NON FORMULARY Take 450 mg by mouth daily.    . Omega-3 Fatty Acids (ULTRA OMEGA-3 FISH OIL PO) Take 1,200 mg by mouth daily.     Marland Kitchen oxyCODONE-acetaminophen (PERCOCET) 5-325 MG  tablet Take 2 tablets by mouth every 6 (six) hours as needed for moderate pain or severe pain. 30 tablet 0  . Red Yeast Rice Extract 600 MG CAPS Take 2 capsules by mouth.    . traMADol (ULTRAM) 50 MG tablet Take 1 tablet (50 mg total) by mouth every 6 (six) hours as needed. 20 tablet 0   No current facility-administered medications for this visit.     Past Medical History:  Diagnosis Date  . Dyslipidemia   . Ectopic atrial tachycardia ([place])   . Environmental allergies   . H/O scarlet fever   . Heart murmur   . HOH (hard of hearing)   . Hyperlipidemia   . Hypertension   . Peripheral vascular disease ([place])   . 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 Arnold Long, [place].  . Transient cerebral ischemia     Past Surgical History:  Procedure Laterality Date  . ABLATION OF DYSRHYTHMIC FOCUS    . AUGMENTATION MAMMAPLASTY Bilateral   . JOINT REPLACEMENT     knee  . KNEE ARTHROSCOPY W/ MENISCAL REPAIR     Right   . PERIPHERAL VASCULAR CATHETERIZATION Right 01/11/2015   Procedure: Lower Extremity Angiography;  Surgeon:  Algernon Huxley, MD;  Location: [facility];  Service: Cardiovascular;  Laterality: Right;  . PERIPHERAL VASCULAR CATHETERIZATION Right 06/21/2015   Procedure: Lower Extremity Angiography;  Surgeon: Algernon Huxley, MD;  Location: [facility];  Service: Cardiovascular;  Laterality: Right;  . PERIPHERAL VASCULAR CATHETERIZATION Right 06/21/2015   Procedure: Lower Extremity Intervention;  Surgeon: Algernon Huxley, MD;  Location: [facility];  Service: Cardiovascular;  Laterality: Right;  . PERIPHERAL VASCULAR CATHETERIZATION Right 10/12/2015   Procedure: Lower Extremity Angiography;  Surgeon: Algernon Huxley, MD;  Location: [facility];  Service: Cardiovascular;  Laterality: Right;  . PERIPHERAL VASCULAR CATHETERIZATION Right 10/12/2015   Procedure: Lower Extremity Intervention;  Surgeon: Algernon Huxley, MD;  Location: [facility];  Service: Cardiovascular;  Laterality: Right;  . PERIPHERAL VASCULAR CATHETERIZATION Left 02/10/2016   Procedure: Lower Extremity Angiography;  Surgeon: Algernon Huxley, MD;  Location: [facility];  Service: Cardiovascular;  Laterality: Left;  . PERIPHERAL VASCULAR CATHETERIZATION  02/10/2016   Procedure: Lower Extremity Intervention;  Surgeon: Algernon Huxley, MD;  Location: [facility];  Service: Cardiovascular;;  . PERIPHERAL VASCULAR CATHETERIZATION Right 02/24/2016   Procedure: Lower Extremity Angiography;  Surgeon: Algernon Huxley, MD;  Location: [facility];  Service: Cardiovascular;  Laterality: Right;  . TONSILLECTOMY    . TOTAL KNEE ARTHROPLASTY  2015  . VAGINAL HYSTERECTOMY      Social History Social History  Substance Use Topics  . Smoking status: Former Smoker    Packs/day: 1.00    Years: 10.00    Types: Cigarettes    Quit date: 02/23/1981  . Smokeless tobacco: Never Used  . Alcohol use No     Family History Family History  Problem Relation Age of Onset  . Alcohol abuse Father   . Cancer Father   . Diabetes Paternal Grandfather   . Breast cancer Neg Hx     Allergies  Allergen Reactions  . Penicillins   . Statins      REVIEW OF SYSTEMS (Negative unless checked)  Constitutional: [] Weight loss  [] Fever  [] Chills Cardiac: [] Chest pain   [] Chest pressure   [] Palpitations   [] Shortness of breath when laying flat   [] Shortness of breath at rest   [] Shortness of breath with exertion. Vascular:  [] Pain in legs with walking   [] Pain in legs at rest   [] Pain in legs when laying flat   [] Claudication   [] Pain in feet when walking  [] Pain in feet at rest  [] Pain in feet when laying flat   [] History of DVT   [] Phlebitis   [x] Swelling in legs   [] Varicose veins   [] Non-healing ulcers Pulmonary:   [] Uses home oxygen   [] Productive cough   [] Hemoptysis   [] Wheeze  [] COPD   [] Asthma Neurologic:  [x] Dizziness  [] Blackouts    [] Seizures   [] History of stroke   [] History of TIA  [] Aphasia   [] Temporary blindness   [] Dysphagia   [] Weakness or numbness in arms   [x] Weakness or numbness in legs Musculoskeletal:  [] Arthritis   [x] Joint swelling   [] Joint pain   [] Low back pain Hematologic:  [] Easy bruising  [] Easy bleeding   [] Hypercoagulable state   [] Anemic   Gastrointestinal:  [] Blood in stool   [] Vomiting blood  [] Gastroesophageal reflux/heartburn   [] Abdominal pain Genitourinary:  [] Chronic kidney disease   [] Difficult urination  [] Frequent urination  [] Burning with urination   [] Hematuria Skin:  [] Rashes   []   Ulcers   [] Wounds Psychological:  [] History of anxiety   []  History of major depression.  Physical Examination  BP (!) 99/44 (BP Location: Left Arm)   Resp 16   Ht 5' 5"  (1.651 m)   Wt 145 lb (65.8 kg)   BMI 24.13 kg/m  Gen:  WD/WN, NAD Head: [place]/AT, No temporalis wasting. Ear/Nose/Throat: Hearing grossly intact, nares w/o erythema or drainage, trachea midline Eyes: PERRLA, EOMI. Sclera non-icteric Neck: Supple, no nuchal rigidity.  No JVD.  Pulmonary:  Good air movement, no use of accessory muscles.  Cardiac: RRR, normal S1, S2 Vascular:  Vessel Right Left  Radial Palpable Palpable  Ulnar Palpable Palpable  Brachial Palpable Palpable  Carotid Palpable, without bruit Palpable, without bruit  Aorta Not palpable N/A  Femoral Palpable Palpable  Popliteal Trace Palpable Palpable  PT 1+ Palpable 2+ Palpable  DP 1+ Palpable 1+ Palpable   Gastrointestinal: soft, non-tender/non-distended. No guarding/reflex.  Musculoskeletal: M/S 5/5 throughout.  No deformity or atrophy. 1+ right lower extremity edema. No appreciable left lower extremity edema. Feet are warm with good capillary refill. Neurologic: CN 2-12 intact. Pain and light touch intact in extremities.  Symmetrical.  Speech is fluent.  Psychiatric: Judgment intact, Mood & affect appropriate for pt's clinical situation. Dermatologic: No rashes or  ulcers noted.  No cellulitis or open wounds. Lymph : No Cervical, Axillary, or Inguinal lymphadenopathy.      Labs Recent Results (from the past 2160 hour(s))  BUN     Status: Abnormal   Collection Time: 02/09/16 12:32 PM  Result Value Ref Range   BUN 28 (H) 6 - 20 mg/dL  Creatinine, serum     Status: None   Collection Time: 02/09/16 12:32 PM  Result Value Ref Range   Creatinine, Ser 0.77 0.44 - 1.00 mg/dL   GFR calc non Af Amer >60 >60 mL/min   GFR calc Af Amer >60 >60 mL/min    Comment: (NOTE) The eGFR has been calculated using the CKD EPI equation. This calculation has not been validated in all clinical situations. eGFR's persistently <60 mL/min signify possible Chronic Kidney Disease.   BUN     Status: Abnormal   Collection Time: 02/23/16  3:34 PM  Result Value Ref Range   BUN 32 (H) 6 - 20 mg/dL  Creatinine, serum     Status: Abnormal   Collection Time: 02/23/16  3:34 PM  Result Value Ref Range   Creatinine, Ser 0.92 0.44 - 1.00 mg/dL   GFR calc non Af Amer 58 (L) >60 mL/min   GFR calc Af Amer >60 >60 mL/min    Comment: (NOTE) The eGFR has been calculated using the CKD EPI equation. This calculation has not been validated in all clinical situations. eGFR's persistently <60 mL/min signify possible Chronic Kidney Disease.     Radiology No results found.    Assessment/Plan  Hypertension blood pressure control important in reducing the progression of atherosclerotic disease. On appropriate oral medications.   Hyperlipidemia lipid control important in reducing the progression of atherosclerotic disease. Continue statin therapy   Intermittent claudication [facility]) Patient has recently undergone bilateral lower extremity revascularization procedures. Her feet are warm and she has palpable pedal pulses. She has some reperfusion swelling a little worse in the right leg and the left, and this is not entirely surprising. She has very severe recurrent disease and she  has been maintained on full anticoagulation which I believe should continue. She has some common femoral disease bilaterally that may eventually need to  be addressed surgically, but does not appear to need addressed currently. She has a follow-up appointment in about 2-3 weeks with noninvasive studies that she will keep.    [name], MD  02/29/2016 2:22 PM    This note was created with Dragon medical transcription system.  Any errors from dictation are purely unintentional

## 2016-03-06 DIAGNOSIS — I471 Supraventricular tachycardia: Secondary | ICD-10-CM | POA: Diagnosis not present

## 2016-03-06 DIAGNOSIS — I6523 Occlusion and stenosis of bilateral carotid arteries: Secondary | ICD-10-CM | POA: Diagnosis not present

## 2016-03-06 DIAGNOSIS — I952 Hypotension due to drugs: Secondary | ICD-10-CM | POA: Diagnosis not present

## 2016-03-06 DIAGNOSIS — E782 Mixed hyperlipidemia: Secondary | ICD-10-CM | POA: Diagnosis not present

## 2016-03-08 DIAGNOSIS — I952 Hypotension due to drugs: Secondary | ICD-10-CM | POA: Insufficient documentation

## 2016-03-09 ENCOUNTER — Other Ambulatory Visit (INDEPENDENT_AMBULATORY_CARE_PROVIDER_SITE_OTHER): Payer: Self-pay | Admitting: Vascular Surgery

## 2016-03-09 DIAGNOSIS — I70212 Atherosclerosis of native arteries of extremities with intermittent claudication, left leg: Secondary | ICD-10-CM

## 2016-03-10 ENCOUNTER — Ambulatory Visit (INDEPENDENT_AMBULATORY_CARE_PROVIDER_SITE_OTHER): Payer: Medicare Other

## 2016-03-10 ENCOUNTER — Encounter (INDEPENDENT_AMBULATORY_CARE_PROVIDER_SITE_OTHER): Payer: Self-pay | Admitting: Vascular Surgery

## 2016-03-10 ENCOUNTER — Telehealth: Payer: Self-pay | Admitting: Cardiovascular Disease

## 2016-03-10 ENCOUNTER — Ambulatory Visit (INDEPENDENT_AMBULATORY_CARE_PROVIDER_SITE_OTHER): Payer: Medicare Other | Admitting: Vascular Surgery

## 2016-03-10 VITALS — BP 162/80 | HR 97 | Resp 17 | Ht 65.5 in | Wt 140.0 lb

## 2016-03-10 DIAGNOSIS — I739 Peripheral vascular disease, unspecified: Secondary | ICD-10-CM

## 2016-03-10 DIAGNOSIS — I70212 Atherosclerosis of native arteries of extremities with intermittent claudication, left leg: Secondary | ICD-10-CM

## 2016-03-10 DIAGNOSIS — E785 Hyperlipidemia, unspecified: Secondary | ICD-10-CM

## 2016-03-10 NOTE — Assessment & Plan Note (Signed)
lipid control important in reducing the progression of atherosclerotic disease. Continue statin therapy  

## 2016-03-10 NOTE — Telephone Encounter (Signed)
Lmom to call our office to schedule a carotid u/s (2 yr f/u).

## 2016-03-10 NOTE — Assessment & Plan Note (Signed)
Currently just has some mild numbness in her right toes. ABIs today show normal triphasic waveforms with a right ABI of 1.06 and a left ABI of 0.94 consistent with no arterial insufficiency. Plan to return in follow-up in 3-4 months with duplex and ABIs. We'll be happy to see the patient sooner if problems develop in the interim. Continue Eliquis and Crestor daily

## 2016-03-10 NOTE — Progress Notes (Signed)
MRN : [id number]  Ashley Hill is a 81 y.o. ([birth year]) female who presents with chief complaint of  Chief Complaint  Patient presents with  . Follow-up  .  History of Present Illness: Patient returns today in follow up of Her peripheral vascular disease. She has undergone bilateral lower extremity revascularization procedures including multiple procedures on the right leg. She is doing pretty well today. She has no rest pain or ulceration. Currently just has some mild numbness in her right toes. ABIs today show normal triphasic waveforms with a right ABI of 1.06 and a left ABI of 0.94 consistent with no arterial insufficiency  Current Outpatient Prescriptions  Medication Sig Dispense Refill  . apixaban (ELIQUIS) 5 MG TABS tablet Take 1 tablet (5 mg total) by mouth 2 (two) times daily. 60 tablet 2  . aspirin 81 MG tablet Take 81 mg by mouth daily.    Marland Kitchen b complex vitamins tablet Take 1 tablet by mouth daily.    Marland Kitchen CALCIUM PO Take 750 mg by mouth 2 (two) times daily.    . carvedilol (COREG) 25 MG tablet Take 25 mg by mouth 2 (two) times daily with a meal.    . cholecalciferol (VITAMIN D) 1000 units tablet Take 2,000 Units by mouth daily.    . Coenzyme Q10 (COQ10) 50 MG CAPS Take 1 capsule by mouth daily.    . CRESTOR 5 MG tablet TAKE 1 TABLET BY MOUTH EVERY DAY 30 tablet 6  . ibuprofen (ADVIL,MOTRIN) 200 MG tablet Take 200 mg by mouth every 6 (six) hours as needed.    . Omega-3 Fatty Acids (ULTRA OMEGA-3 FISH OIL PO) Take 1,200 mg by mouth daily.     . Red Yeast Rice Extract 600 MG CAPS Take 2 capsules by mouth.    Marland Kitchen glucosamine-chondroitin 500-400 MG tablet Take 1,500 tablets by mouth 2 (two) times daily. Reported on 10/12/2015    . hydrochlorothiazide (HYDRODIURIL) 25 MG tablet TAKE 1 TABLET BY MOUTH EVERY DAY (Patient not taking: Reported on 03/10/2016) 30 tablet 0  . hydrochlorothiazide (HYDRODIURIL) 25 MG tablet TAKE 1 TABLET BY MOUTH EVERY DAY (Patient not taking: Reported on  03/10/2016) 30 tablet 3  . irbesartan (AVAPRO) 300 MG tablet Take 1 tablet (300 mg total) by mouth daily. (Patient not taking: Reported on 03/10/2016) 30 tablet 6  . NON FORMULARY Take 450 mg by mouth daily.    Marland Kitchen oxyCODONE-acetaminophen (PERCOCET) 5-325 MG tablet Take 2 tablets by mouth every 6 (six) hours as needed for moderate pain or severe pain. (Patient not taking: Reported on 03/10/2016) 30 tablet 0  . traMADol (ULTRAM) 50 MG tablet Take 1 tablet (50 mg total) by mouth every 6 (six) hours as needed. (Patient not taking: Reported on 03/10/2016) 20 tablet 0   No current facility-administered medications for this visit.     Past Medical History:  Diagnosis Date  . Dyslipidemia   . Ectopic atrial tachycardia ([place])   . Environmental allergies   . H/O scarlet fever   . Heart murmur   . HOH (hard of hearing)   . Hyperlipidemia   . Hypertension   . Peripheral vascular disease ([place])   . 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 Arnold Long, [place].  . Transient cerebral ischemia     Past Surgical History:  Procedure Laterality Date  . ABLATION OF DYSRHYTHMIC FOCUS    . AUGMENTATION MAMMAPLASTY Bilateral   . JOINT  REPLACEMENT     knee  . KNEE ARTHROSCOPY W/ MENISCAL REPAIR     Right   . PERIPHERAL VASCULAR CATHETERIZATION Right 01/11/2015   Procedure: Lower Extremity Angiography;  Surgeon: Algernon Huxley, MD;  Location: [facility];  Service: Cardiovascular;  Laterality: Right;  . PERIPHERAL VASCULAR CATHETERIZATION Right 06/21/2015   Procedure: Lower Extremity Angiography;  Surgeon: Algernon Huxley, MD;  Location: [facility];  Service: Cardiovascular;  Laterality: Right;  . PERIPHERAL VASCULAR CATHETERIZATION Right 06/21/2015   Procedure: Lower Extremity Intervention;  Surgeon: Algernon Huxley, MD;  Location: [facility];  Service: Cardiovascular;  Laterality: Right;  . PERIPHERAL VASCULAR CATHETERIZATION  Right 10/12/2015   Procedure: Lower Extremity Angiography;  Surgeon: Algernon Huxley, MD;  Location: [facility];  Service: Cardiovascular;  Laterality: Right;  . PERIPHERAL VASCULAR CATHETERIZATION Right 10/12/2015   Procedure: Lower Extremity Intervention;  Surgeon: Algernon Huxley, MD;  Location: [facility];  Service: Cardiovascular;  Laterality: Right;  . PERIPHERAL VASCULAR CATHETERIZATION Left 02/10/2016   Procedure: Lower Extremity Angiography;  Surgeon: Algernon Huxley, MD;  Location: [facility];  Service: Cardiovascular;  Laterality: Left;  . PERIPHERAL VASCULAR CATHETERIZATION  02/10/2016   Procedure: Lower Extremity Intervention;  Surgeon: Algernon Huxley, MD;  Location: [facility];  Service: Cardiovascular;;  . PERIPHERAL VASCULAR CATHETERIZATION Right 02/24/2016   Procedure: Lower Extremity Angiography;  Surgeon: Algernon Huxley, MD;  Location: [facility];  Service: Cardiovascular;  Laterality: Right;  . TONSILLECTOMY    . TOTAL KNEE ARTHROPLASTY  2015  . VAGINAL HYSTERECTOMY      Social History Social History  Substance Use Topics  . Smoking status: Former Smoker    Packs/day: 1.00    Years: 10.00    Types: Cigarettes    Quit date: 02/23/1981  . Smokeless tobacco: Never Used  . Alcohol use No     Family History Family History  Problem Relation Age of Onset  . Alcohol abuse Father   . Cancer Father   . Diabetes Paternal Grandfather   . Breast cancer Neg Hx     Allergies  Allergen Reactions  . Penicillins   . Statins      REVIEW OF SYSTEMS (Negative unless checked)  Constitutional: _0 Weight loss  _1 Fever  _2 Chills Cardiac: _3 Chest pain   _4 Chest pressure   _5 Palpitations   _6 Shortness of breath when laying flat   _7 Shortness of breath at rest   _8 Shortness of breath with exertion. Vascular:  _9 Pain in legs with walking   _10 Pain in legs at rest   _11 Pain in legs when laying flat   _12 Claudication   _13 Pain in feet when walking  _14 Pain  in feet at rest  _15 Pain in feet when laying flat   _16 History of DVT   _17 Phlebitis   _18 Swelling in legs   _19 Varicose veins   _20 Non-healing ulcers Pulmonary:   _21 Uses home oxygen   _22 Productive cough   _23 Hemoptysis   _24 Wheeze  _25 COPD   _26 Asthma Neurologic:  _27 Dizziness  _28 Blackouts   _29 Seizures   _30 History of stroke   _31 History of TIA  _32 Aphasia   _33 Temporary blindness   _34 Dysphagia   _35 Weakness or numbness in arms   _36 Weakness or numbness in legs Musculoskeletal:  _37 Arthritis   _38 Joint swelling   _39 Joint pain   _40 Low back pain Hematologic:  _41 Easy bruising  _42 Easy bleeding   _43 Hypercoagulable state   _44 Anemic   Gastrointestinal:  _45 Blood in  stool   _0 Vomiting blood  _1 Gastroesophageal reflux/heartburn   _2 Abdominal pain Genitourinary:  _3 Chronic kidney disease   _4 Difficult urination  _5 Frequent urination  _6 Burning with urination   _7 Hematuria Skin:  _8 Rashes   _9 Ulcers   _10 Wounds Psychological:  _11 History of anxiety   _12  History of major depression.  Physical Examination  BP (!) 162/80   Pulse 97   Resp 17   Ht 5' 5.5" (1.664 m)   Wt 63.5 kg (140 lb)   BMI 22.94 kg/m  Gen:  WD/WN, NAD. Appears younger than stated age Head: [place]/AT, No temporalis wasting. Ear/Nose/Throat: Hearing grossly intact, nares w/o erythema or drainage, trachea midline Eyes: Conjunctiva clear. Sclera non-icteric Neck: Supple.  No JVD.  Pulmonary:  Good air movement, no use of accessory muscles.  Cardiac: RRR, normal S1, S2 Vascular:  Vessel Right Left  Radial Palpable Palpable  Ulnar Palpable Palpable  Brachial Palpable Palpable  Carotid Palpable, without bruit Palpable, without bruit  Aorta Not palpable N/A  Femoral Palpable Palpable  Popliteal Palpable Palpable  PT 1+ Palpable Palpable  DP Palpable 1+ Palpable   Gastrointestinal: soft, non-tender/non-distended. No guarding/reflex.  Musculoskeletal: M/S 5/5 throughout.  No deformity or atrophy. No significant lower extremity edema. Neurologic: Sensation  grossly intact in extremities.  Symmetrical.  Speech is fluent.  Psychiatric: Judgment intact, Mood & affect appropriate for pt's clinical situation. Dermatologic: No rashes or ulcers noted.  No cellulitis or open wounds. Lymph : No Cervical, Axillary, or Inguinal lymphadenopathy.      Labs Recent Results (from the past 2160 hour(s))  BUN     Status: Abnormal   Collection Time: 02/09/16 12:32 PM  Result Value Ref Range   BUN 28 (H) 6 - 20 mg/dL  Creatinine, serum     Status: None   Collection Time: 02/09/16 12:32 PM  Result Value Ref Range   Creatinine, Ser 0.77 0.44 - 1.00 mg/dL   GFR calc non Af Amer >60 >60 mL/min   GFR calc Af Amer >60 >60 mL/min    Comment: (NOTE) The eGFR has been calculated using the CKD EPI equation. This calculation has not been validated in all clinical situations. eGFR's persistently <60 mL/min signify possible Chronic Kidney Disease.   BUN     Status: Abnormal   Collection Time: 02/23/16  3:34 PM  Result Value Ref Range   BUN 32 (H) 6 - 20 mg/dL  Creatinine, serum     Status: Abnormal   Collection Time: 02/23/16  3:34 PM  Result Value Ref Range   Creatinine, Ser 0.92 0.44 - 1.00 mg/dL   GFR calc non Af Amer 58 (L) >60 mL/min   GFR calc Af Amer >60 >60 mL/min    Comment: (NOTE) The eGFR has been calculated using the CKD EPI equation. This calculation has not been validated in all clinical situations. eGFR's persistently <60 mL/min signify possible Chronic Kidney Disease.     Radiology No results found.    Assessment/Plan  Hyperlipidemia lipid control important in reducing the progression of atherosclerotic disease. Continue statin therapy   Intermittent claudication (HCC) Currently just has some mild numbness in her right toes. ABIs today show normal triphasic waveforms with a right ABI of 1.06 and a left ABI of 0.94 consistent with no arterial insufficiency. Plan to return in follow-up in 3-4 months with duplex and ABIs. We'll be  happy to see the patient sooner if problems develop in the interim. Continue Eliquis and Crestor daily    [name], MD  03/10/2016  12:01 PM    This note was created with Dragon medical transcription system.  Any errors from dictation are purely unintentional

## 2016-04-05 DIAGNOSIS — I70219 Atherosclerosis of native arteries of extremities with intermittent claudication, unspecified extremity: Secondary | ICD-10-CM | POA: Diagnosis not present

## 2016-04-05 DIAGNOSIS — Z79899 Other long term (current) drug therapy: Secondary | ICD-10-CM | POA: Diagnosis not present

## 2016-04-05 DIAGNOSIS — I471 Supraventricular tachycardia: Secondary | ICD-10-CM | POA: Diagnosis not present

## 2016-04-05 DIAGNOSIS — G6289 Other specified polyneuropathies: Secondary | ICD-10-CM | POA: Diagnosis not present

## 2016-04-05 DIAGNOSIS — I10 Essential (primary) hypertension: Secondary | ICD-10-CM | POA: Diagnosis not present

## 2016-04-10 ENCOUNTER — Emergency Department: Payer: Medicare Other

## 2016-04-10 ENCOUNTER — Ambulatory Visit: Payer: Medicare Other

## 2016-04-10 ENCOUNTER — Encounter: Payer: Self-pay | Admitting: Emergency Medicine

## 2016-04-10 ENCOUNTER — Ambulatory Visit: Payer: Medicare Other | Admitting: Anesthesiology

## 2016-04-10 ENCOUNTER — Encounter
Admission: EM | Disposition: A | Discharge status: Home / Self Care | Payer: Self-pay | Source: Home / Self Care | Attending: Emergency Medicine

## 2016-04-10 ENCOUNTER — Ambulatory Visit
Admission: EM | Admit: 2016-04-10 | Discharge: 2016-04-10 | Disposition: A | Discharge status: Home / Self Care | Payer: Medicare Other | Attending: Orthopedic Surgery | Admitting: Orthopedic Surgery

## 2016-04-10 DIAGNOSIS — Z96653 Presence of artificial knee joint, bilateral: Secondary | ICD-10-CM | POA: Insufficient documentation

## 2016-04-10 DIAGNOSIS — Z880 Allergy status to penicillin: Secondary | ICD-10-CM | POA: Insufficient documentation

## 2016-04-10 DIAGNOSIS — M353 Polymyalgia rheumatica: Secondary | ICD-10-CM | POA: Diagnosis not present

## 2016-04-10 DIAGNOSIS — M1990 Unspecified osteoarthritis, unspecified site: Secondary | ICD-10-CM | POA: Diagnosis not present

## 2016-04-10 DIAGNOSIS — I739 Peripheral vascular disease, unspecified: Secondary | ICD-10-CM | POA: Diagnosis not present

## 2016-04-10 DIAGNOSIS — W010XXA Fall on same level from slipping, tripping and stumbling without subsequent striking against object, initial encounter: Secondary | ICD-10-CM | POA: Diagnosis not present

## 2016-04-10 DIAGNOSIS — I10 Essential (primary) hypertension: Secondary | ICD-10-CM | POA: Diagnosis not present

## 2016-04-10 DIAGNOSIS — F329 Major depressive disorder, single episode, unspecified: Secondary | ICD-10-CM | POA: Diagnosis not present

## 2016-04-10 DIAGNOSIS — S43004A Unspecified dislocation of right shoulder joint, initial encounter: Secondary | ICD-10-CM | POA: Diagnosis not present

## 2016-04-10 DIAGNOSIS — S43011A Anterior subluxation of right humerus, initial encounter: Secondary | ICD-10-CM | POA: Diagnosis not present

## 2016-04-10 DIAGNOSIS — T1490XA Injury, unspecified, initial encounter: Secondary | ICD-10-CM

## 2016-04-10 DIAGNOSIS — S42209A Unspecified fracture of upper end of unspecified humerus, initial encounter for closed fracture: Secondary | ICD-10-CM | POA: Diagnosis not present

## 2016-04-10 DIAGNOSIS — E785 Hyperlipidemia, unspecified: Secondary | ICD-10-CM | POA: Insufficient documentation

## 2016-04-10 DIAGNOSIS — S43014A Anterior dislocation of right humerus, initial encounter: Secondary | ICD-10-CM | POA: Diagnosis not present

## 2016-04-10 DIAGNOSIS — S42391A Other fracture of shaft of right humerus, initial encounter for closed fracture: Secondary | ICD-10-CM | POA: Diagnosis not present

## 2016-04-10 DIAGNOSIS — S4291XA Fracture of right shoulder girdle, part unspecified, initial encounter for closed fracture: Secondary | ICD-10-CM | POA: Diagnosis present

## 2016-04-10 DIAGNOSIS — S42251A Displaced fracture of greater tuberosity of right humerus, initial encounter for closed fracture: Secondary | ICD-10-CM | POA: Diagnosis not present

## 2016-04-10 DIAGNOSIS — M21829 Other specified acquired deformities of unspecified upper arm: Secondary | ICD-10-CM | POA: Diagnosis present

## 2016-04-10 DIAGNOSIS — Z87891 Personal history of nicotine dependence: Secondary | ICD-10-CM | POA: Insufficient documentation

## 2016-04-10 DIAGNOSIS — Z7982 Long term (current) use of aspirin: Secondary | ICD-10-CM | POA: Diagnosis not present

## 2016-04-10 DIAGNOSIS — T148XXA Other injury of unspecified body region, initial encounter: Secondary | ICD-10-CM

## 2016-04-10 HISTORY — PX: SHOULDER CLOSED REDUCTION: SHX1051

## 2016-04-10 SURGERY — CLOSED REDUCTION, SHOULDER
Anesthesia: General | Laterality: Right

## 2016-04-10 MED ORDER — MORPHINE SULFATE (PF) 4 MG/ML IV SOLN
4.0000 mg | Freq: Once | INTRAVENOUS | Status: AC
  Administered 2016-04-10: 4 mg via INTRAVENOUS

## 2016-04-10 MED ORDER — MORPHINE SULFATE (PF) 4 MG/ML IV SOLN: 2.0000 mg | INTRAVENOUS | Status: DC | PRN

## 2016-04-10 MED ORDER — ETOMIDATE 2 MG/ML IV SOLN
10.0000 mg | Freq: Once | INTRAVENOUS | Status: AC
  Administered 2016-04-10: 10 mg via INTRAVENOUS
  Filled 2016-04-10: qty 10

## 2016-04-10 MED ORDER — FENTANYL CITRATE (PF) 100 MCG/2ML IJ SOLN: 25.0000 ug | INTRAMUSCULAR | Status: DC | PRN

## 2016-04-10 MED ORDER — ETOMIDATE 2 MG/ML IV SOLN
INTRAVENOUS | Status: DC
Start: 2016-04-10 — End: 2016-04-10
  Filled 2016-04-10: qty 10

## 2016-04-10 MED ORDER — SUCCINYLCHOLINE CHLORIDE 20 MG/ML IJ SOLN
INTRAMUSCULAR | Status: DC | PRN
  Administered 2016-04-10: 100 mg via INTRAVENOUS

## 2016-04-10 MED ORDER — ETOMIDATE 2 MG/ML IV SOLN
INTRAVENOUS | Status: AC | PRN
  Administered 2016-04-10: 10 mg via INTRAVENOUS

## 2016-04-10 MED ORDER — MORPHINE SULFATE (PF) 4 MG/ML IV SOLN
INTRAVENOUS | Status: AC
  Filled 2016-04-10: qty 1

## 2016-04-10 MED ORDER — MORPHINE SULFATE (PF) 4 MG/ML IV SOLN
4.0000 mg | Freq: Once | INTRAVENOUS | Status: AC
  Administered 2016-04-10: 4 mg via INTRAVENOUS
  Filled 2016-04-10: qty 1

## 2016-04-10 MED ORDER — ETOMIDATE 2 MG/ML IV SOLN
INTRAVENOUS | Status: DC | PRN
  Administered 2016-04-10: 10 mg via INTRAVENOUS

## 2016-04-10 MED ORDER — SODIUM CHLORIDE 0.9 % IV SOLN
INTRAVENOUS | Status: DC
  Administered 2016-04-10: 15:00:00 via INTRAVENOUS

## 2016-04-10 MED ORDER — MEPERIDINE HCL 25 MG/ML IJ SOLN
25.0000 mg | Freq: Once | INTRAMUSCULAR | Status: AC
  Administered 2016-04-10: 25 mg via INTRAVENOUS

## 2016-04-10 MED ORDER — SODIUM CHLORIDE 0.9 % IV BOLUS (SEPSIS)
500.0000 mL | Freq: Once | INTRAVENOUS | Status: AC
  Administered 2016-04-10: 500 mL via INTRAVENOUS

## 2016-04-10 MED ORDER — LABETALOL HCL 5 MG/ML IV SOLN
INTRAVENOUS | Status: AC
  Filled 2016-04-10: qty 4

## 2016-04-10 MED ORDER — ONDANSETRON HCL 4 MG/2ML IJ SOLN
INTRAMUSCULAR | Status: DC | PRN
  Administered 2016-04-10: 4 mg via INTRAVENOUS

## 2016-04-10 MED ORDER — HYDROCODONE-ACETAMINOPHEN 5-325 MG PO TABS: 1.0000 | ORAL_TABLET | Freq: Four times a day (QID) | ORAL | 0 refills | Status: DC | PRN

## 2016-04-10 MED ORDER — FENTANYL CITRATE (PF) 100 MCG/2ML IJ SOLN
INTRAMUSCULAR | Status: DC | PRN
  Administered 2016-04-10 (×2): 25 ug via INTRAVENOUS

## 2016-04-10 MED ORDER — PROPOFOL 10 MG/ML IV BOLUS
INTRAVENOUS | Status: DC | PRN
  Administered 2016-04-10: 120 mg via INTRAVENOUS

## 2016-04-10 MED ORDER — LIDOCAINE HCL (CARDIAC) 20 MG/ML IV SOLN
INTRAVENOUS | Status: DC | PRN
  Administered 2016-04-10: 50 mg via INTRAVENOUS

## 2016-04-10 MED ORDER — ONDANSETRON HCL 4 MG/2ML IJ SOLN: 4.0000 mg | Freq: Once | INTRAMUSCULAR | Status: DC | PRN

## 2016-04-10 MED ORDER — ONDANSETRON HCL 4 MG/2ML IJ SOLN
4.0000 mg | Freq: Once | INTRAMUSCULAR | Status: AC
  Administered 2016-04-10: 4 mg via INTRAVENOUS
  Filled 2016-04-10: qty 2

## 2016-04-10 MED ORDER — LABETALOL HCL 5 MG/ML IV SOLN
10.0000 mg | Freq: Once | INTRAVENOUS | Status: AC
Start: 2016-04-10 — End: 2016-04-10
  Administered 2016-04-10: 10 mg via INTRAVENOUS

## 2016-04-10 SURGICAL SUPPLY — 41 items
CANISTER SUCT 1200ML W/VALVE (MISCELLANEOUS) IMPLANT
CHLORAPREP W/TINT 26ML (MISCELLANEOUS) IMPLANT
COOLER POLAR GLACIER W/PUMP (MISCELLANEOUS) IMPLANT
DRAPE INCISE IOBAN 66X45 STRL (DRAPES) IMPLANT
DRSG EMULSION OIL 3X8 NADH (GAUZE/BANDAGES/DRESSINGS) IMPLANT
ELECT CAUTERY BLADE 6.4 (BLADE) IMPLANT
GAUZE PETRO XEROFOAM 1X8 (MISCELLANEOUS) IMPLANT
GAUZE SPONGE 4X4 12PLY STRL (GAUZE/BANDAGES/DRESSINGS) IMPLANT
GLOVE BIOGEL PI IND STRL 9 (GLOVE) IMPLANT
GLOVE BIOGEL PI INDICATOR 9 (GLOVE)
GLOVE SURG SYN 9.0  PF PI (GLOVE)
GLOVE SURG SYN 9.0 PF PI (GLOVE) IMPLANT
GOWN SRG 2XL LVL 4 RGLN SLV (GOWNS) IMPLANT
GOWN STRL NON-REIN 2XL LVL4 (GOWNS)
GOWN STRL REUS W/ TWL LRG LVL3 (GOWN DISPOSABLE) IMPLANT
GOWN STRL REUS W/TWL 2XL LVL3 (GOWN DISPOSABLE) IMPLANT
GOWN STRL REUS W/TWL LRG LVL3 (GOWN DISPOSABLE)
GOWN STRL REUS W/TWL XL LVL4 (GOWN DISPOSABLE) IMPLANT
KIT RM TURNOVER STRD PROC AR (KITS) IMPLANT
KIT STABILIZATION SHOULDER (MISCELLANEOUS) IMPLANT
LOOP RED MAXI  1X406MM (MISCELLANEOUS)
LOOP VESSEL MAXI 1X406 RED (MISCELLANEOUS) IMPLANT
MASK FACE SPIDER DISP (MASK) IMPLANT
NDL SAFETY 22GX1.5 (NEEDLE) IMPLANT
NEEDLE HYPO 25X1 1.5 SAFETY (NEEDLE) IMPLANT
NEEDLE SPNL 18GX3.5 QUINCKE PK (NEEDLE) IMPLANT
NS IRRIG 1000ML POUR BTL (IV SOLUTION) IMPLANT
PACK ARTHROSCOPY SHOULDER (MISCELLANEOUS) IMPLANT
PAD ABD DERMACEA PRESS 5X9 (GAUZE/BANDAGES/DRESSINGS) IMPLANT
RETRIEVER SUT HEWSON (MISCELLANEOUS) IMPLANT
SPONGE LAP 18X18 5 PK (GAUZE/BANDAGES/DRESSINGS) IMPLANT
STOCKINETTE IMPERVIOUS 9X36 MD (GAUZE/BANDAGES/DRESSINGS) IMPLANT
STRIP CLOSURE SKIN 1/2X4 (GAUZE/BANDAGES/DRESSINGS) IMPLANT
SUT ETHIBOND NAB CT1 #1 30IN (SUTURE) IMPLANT
SUT ETHILON 3 0 FSLX (SUTURE) IMPLANT
SUT TICRON 2-0 30IN 311381 (SUTURE) IMPLANT
SUT VIC AB 0 CT1 36 (SUTURE) IMPLANT
SUT VIC AB 2-0 CT1 27 (SUTURE)
SUT VIC AB 2-0 CT1 TAPERPNT 27 (SUTURE) IMPLANT
SYRINGE 10CC LL (SYRINGE) IMPLANT
WATER STERILE IRR 1000ML POUR (IV SOLUTION) IMPLANT

## 2016-04-10 NOTE — ED Notes (Signed)
Dr Menz at bedside. °

## 2016-04-10 NOTE — ED Provider Notes (Signed)
[facility] Emergency Department Provider Note  ____________________________________________   First MD Initiated Contact with Patient 04/10/16 1220     (approximate)  I have reviewed the triage vital signs and the nursing notes.   HISTORY  Chief Complaint Arm Injury   HPI Ernst SpellBetty J Carrithers is a 81 y.o. female with a history of peripheral arterial disease was presenting to the emergency department today with right shoulder pain. She says that she was trying to put some plates away and was standing on a stool when she slipped and fell. She denies hitting her head or losing consciousness. She says that she landed on her left hand and does not remember landing on her right shoulder but is having pain there now. She was originally seen in the flex care area of the emergency department and sent to the emergency department after fracture and dislocation were discovered. She denies any numbness or tingling or weakness distally to the site of the injury. She does take eliquis but says that she has not taken her medication this morning.   Past Medical History:  Diagnosis Date  . Dyslipidemia   . Ectopic atrial tachycardia (HCC)   . Environmental allergies   . H/O scarlet fever   . Heart murmur   . HOH (hard of hearing)   . Hyperlipidemia   . Hypertension   . Peripheral vascular disease (HCC)   . 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 [place]Sagi N. [place]  . Transient cerebral ischemia     Patient Active Problem List   Diagnosis Date Noted  . Intermittent claudication (HCC) 06/30/2015  . Polymyalgia rheumatica (HCC) 10/27/2014  . Carotid artery narrowing 10/05/2014  . Atrioventricular nodal re-entry tachycardia (HCC) 10/05/2014  . Lesion of right parietal bone 01/27/2014  . Left sided numbness 01/05/2014  . Medicare annual wellness visit, initial 12/09/2013  . Bleeding 12/09/2013  . Dizziness  and giddiness 12/09/2013  . Preop cardiovascular exam 04/21/2013  . Arthritis, degenerative 10/23/2012  . Chest pain 07/22/2012  . SVT (supraventricular tachycardia) (HCC)   . Hypertension   . Hyperlipidemia     Past Surgical History:  Procedure Laterality Date  . ABLATION OF DYSRHYTHMIC FOCUS    . AUGMENTATION MAMMAPLASTY Bilateral   . JOINT REPLACEMENT     knee  . KNEE ARTHROSCOPY W/ MENISCAL REPAIR     Right   . PERIPHERAL VASCULAR CATHETERIZATION Right 01/11/2015   Procedure: Lower Extremity Angiography;  Surgeon: Annice NeedyJason S Dew, MD;  Location: [facility] INVASIVE CV [facility];  Service: Cardiovascular;  Laterality: Right;  . PERIPHERAL VASCULAR CATHETERIZATION Right 06/21/2015   Procedure: Lower Extremity Angiography;  Surgeon: Annice NeedyJason S Dew, MD;  Location: [facility];  Service: Cardiovascular;  Laterality: Right;  . PERIPHERAL VASCULAR CATHETERIZATION Right 06/21/2015   Procedure: Lower Extremity Intervention;  Surgeon: Annice NeedyJason S Dew, MD;  Location: [facility];  Service: Cardiovascular;  Laterality: Right;  . PERIPHERAL VASCULAR CATHETERIZATION Right 10/12/2015   Procedure: Lower Extremity Angiography;  Surgeon: Annice NeedyJason S Dew, MD;  Location: [facility];  Service: Cardiovascular;  Laterality: Right;  . PERIPHERAL VASCULAR CATHETERIZATION Right 10/12/2015   Procedure: Lower Extremity Intervention;  Surgeon: Annice NeedyJason S Dew, MD;  Location: [facility];  Service: Cardiovascular;  Laterality: Right;  . PERIPHERAL VASCULAR CATHETERIZATION Left 02/10/2016   Procedure: Lower Extremity Angiography;  Surgeon: Annice NeedyJason S Dew, MD;  Location: [facility];  Service:  Cardiovascular;  Laterality: Left;  . PERIPHERAL VASCULAR CATHETERIZATION  02/10/2016   Procedure: Lower Extremity Intervention;  Surgeon: Annice Needy, MD;  Location: [facility];  Service: Cardiovascular;;  . PERIPHERAL VASCULAR CATHETERIZATION Right 02/24/2016   Procedure: Lower Extremity Angiography;   Surgeon: Annice Needy, MD;  Location: [facility];  Service: Cardiovascular;  Laterality: Right;  . TONSILLECTOMY    . TOTAL KNEE ARTHROPLASTY  2015  . VAGINAL HYSTERECTOMY      Prior to Admission medications   Medication Sig Start Date End Date Taking? Authorizing Provider  amLODipine (NORVASC) 5 MG tablet Take 5 mg by mouth daily.   Yes Historical Provider, MD  apixaban (ELIQUIS) 5 MG TABS tablet Take 1 tablet (5 mg total) by mouth 2 (two) times daily. 10/12/15  Yes Annice Needy, MD  aspirin 81 MG tablet Take 81 mg by mouth daily.   Yes Historical Provider, MD  b complex vitamins tablet Take 1 tablet by mouth daily.   Yes Historical Provider, MD  CALCIUM PO Take 750 mg by mouth 2 (two) times daily.   Yes Historical Provider, MD  carvedilol (COREG) 25 MG tablet Take 25 mg by mouth 2 (two) times daily with a meal.   Yes Historical Provider, MD  cholecalciferol (VITAMIN D) 1000 units tablet Take 2,000 Units by mouth daily.   Yes Historical Provider, MD  Coenzyme Q10 (COQ10) 50 MG CAPS Take 1 capsule by mouth daily.   Yes Historical Provider, MD  CRESTOR 5 MG tablet TAKE 1 TABLET BY MOUTH EVERY DAY 04/14/14  Yes Iran Ouch, MD  hydrochlorothiazide (HYDRODIURIL) 25 MG tablet TAKE 1 TABLET BY MOUTH EVERY DAY 05/03/15  Yes Iran Ouch, MD  ibuprofen (ADVIL,MOTRIN) 200 MG tablet Take 200 mg by mouth every 6 (six) hours as needed.   Yes Historical Provider, MD  Turmeric 450 MG CAPS Take 450 mg by mouth daily.   Yes Historical Provider, MD  oxyCODONE-acetaminophen (PERCOCET) 5-325 MG tablet Take 2 tablets by mouth every 6 (six) hours as needed for moderate pain or severe pain. Patient not taking: Reported on 04/10/2016 06/22/15   Emily Filbert, MD  traMADol (ULTRAM) 50 MG tablet Take 1 tablet (50 mg total) by mouth every 6 (six) hours as needed. Patient not taking: Reported on 04/10/2016 02/24/16   Annice Needy, MD    Allergies Penicillins and Statins  Family History  Problem  Relation Age of Onset  . Alcohol abuse Father   . Cancer Father   . Diabetes Paternal Grandfather   . Breast cancer Neg Hx     Social History Social History  Substance Use Topics  . Smoking status: Former Smoker    Packs/day: 1.00    Years: 10.00    Types: Cigarettes    Quit date: 02/23/1981  . Smokeless tobacco: Never Used  . Alcohol use No    Review of Systems Constitutional: No fever/chills Eyes: No visual changes. ENT: No sore throat. Cardiovascular: Denies chest pain. Respiratory: Denies shortness of breath. Gastrointestinal: No abdominal pain.  No nausea, no vomiting.  No diarrhea.  No constipation. Genitourinary: Negative for dysuria. Musculoskeletal: Negative for back pain. Skin: Negative for rash. Neurological: Negative for headaches, focal weakness or numbness.  10-point ROS otherwise negative.  ____________________________________________   PHYSICAL EXAM:  VITAL SIGNS: ED Triage Vitals  Enc Vitals Group     BP 04/10/16 1115 (!) 194/85     Pulse Rate 04/10/16 1115 (!) 106  Resp 04/10/16 1115 18     Temp 04/10/16 1115 97.8 F (36.6 C)     Temp Source 04/10/16 1115 Oral     SpO2 04/10/16 1115 96 %     Weight --      Height --      Head Circumference --      Peak Flow --      Pain Score 04/10/16 1055 10     Pain Loc --      Pain Edu? --      Excl. in GC? --     Constitutional: Alert and oriented. Well appearing and in no acute distress. Eyes: Conjunctivae are normal. PERRL. EOMI. Head: Atraumatic. Nose: No congestion/rhinnorhea. Mouth/Throat: Mucous membranes are moist.  Neck: No stridor.   Cardiovascular: Normal rate, regular rhythm. Grossly normal heart sounds.  Good peripheral circulation. Respiratory: Normal respiratory effort.  No retractions. Lungs CTAB. Gastrointestinal: Soft and nontender. No distention. No abdominal bruits. No CVA tenderness. Musculoskeletal: Right shoulder with lateral tenderness and obvious deformity. Distal to  the injury there is no deformity. She is neurovascularly intact with sensation to light touch over the right lateral deltoid. Intact radial pulse and 5 out of 5 strength to the right hand. Patient is holding the right upper extremity in adduction and internal rotation. She says that she is unable to range it due to pain. Neurologic:  Normal speech and language. No gross focal neurologic deficits are appreciated. No gait instability. Skin:  Skin is warm, dry and intact. No rash noted. Psychiatric: Mood and affect are normal. Speech and behavior are normal.  ____________________________________________   LABS (all labs ordered are listed, but only abnormal results are displayed)  Labs Reviewed - No data to display ____________________________________________  EKG   ____________________________________________  RADIOLOGY  DG Humerus Right (Final result)  Result time 04/10/16 11:51:33  Final result by Charlett Nose, MD (04/10/16 11:51:33)           Narrative:   CLINICAL DATA: Fall, landing on right arm. Mid humerus pain.  EXAM: RIGHT HUMERUS - 2+ VIEW  COMPARISON: None.  FINDINGS: There is anterior dislocation of the right humeral head. Impaction fracture noted along the humeral head with multiple fracture fragments.  IMPRESSION: Anterior right shoulder dislocation with Hill-Sachs fracture. Multiple displaced fracture fragments.   Electronically Signed By: Charlett Nose M.D. On: 04/10/2016 11:51            ____________________________________________   PROCEDURES  Procedure(s) performed:  Reduction of dislocation Date/Time: 1:26 PM Performed by: Arelia Longest Authorized by: Arelia Longest Consent: Verbal consent obtained. Risks and benefits: risks, benefits and alternatives were discussed Consent given by: patient Required items: required blood products, implants, devices, and special equipment available Time out: Immediately prior to  procedure a "time out" was called to verify the correct patient, procedure, equipment, support staff and site/side marked as required.  Patient sedated: with etomidate  Vitals: Vital signs were monitored during sedation. Patient tolerance: Patient tolerated the procedure well with no immediate complications. Joint: right shoulder Reduction technique: modified hennepin  Placed in shoulder immobilizer status post reduction.  Procedural sedation Performed by: Arelia Longest Consent: Verbal consent obtained. Risks and benefits: risks, benefits and alternatives were discussed Required items: required blood products, implants, devices, and special equipment available Patient identity confirmed: arm band and provided demographic data Time out: Immediately prior to procedure a "time out" was called to verify the correct patient, procedure, equipment, support staff and site/side marked  as required.  Sedation type: moderate (conscious) sedation NPO time confirmed and considedered  Sedatives:etomidate  Physician Time at Bedside: 15min  Vitals: Vital signs were monitored during sedation. Cardiac Monitor, pulse oximeter Patient tolerance: Patient tolerated the procedure well with no immediate complications. Comments: Pt with uneventful recovered. Returned to pre-procedural sedation baseline\   Procedures  Critical Care performed:   ____________________________________________   INITIAL IMPRESSION / ASSESSMENT AND PLAN / ED COURSE  Pertinent labs & imaging results that were available during my care of the patient were reviewed by me and considered in my medical decision making (see chart for details).   ----------------------------------------- 1:14 PM on 04/10/2016 ----------------------------------------- Patient consented and we discussed the risks as well as benefits of the procedure and the patient as well as her daughter who is at bedside are understanding and have signed  the written consent.  ----------------------------------------- 2:26 PM on 04/10/2016 -----------------------------------------  Unsuccessful shoulder reduction. Discussed case with Dr. Lynnea MaizesMansfield will be in the emergency department to see the patient and likely reduce the shoulder. Updated the family as well as the patient to the plan. Patient is neurovascularly intact still.  ----------------------------------------- 2:59 PM on 04/10/2016 -----------------------------------------  Second reduction unsuccessful with Dr. Rosita KeaMenz.  Dr. Rosita KeaMenz to take the patient to the operating room for reduction. Explained this to the family as well as the patient are understanding willing to comply.  ____________________________________________   FINAL CLINICAL IMPRESSION(S) / ED DIAGNOSES  Right shoulder fracture dislocation.    NEW MEDICATIONS STARTED DURING THIS VISIT:  New Prescriptions   No medications on file     Note:  This document was prepared using Dragon voice recognition software and may include unintentional dictation errors.    Myrna Blazeravid Matthew Sullivan Jacuinde, MD 04/10/16 1500

## 2016-04-10 NOTE — Anesthesia Preprocedure Evaluation (Signed)
Anesthesia Evaluation  Patient identified by MRN, date of birth, ID band Patient awake    Reviewed: Allergy & Precautions, NPO status , Patient's Chart, lab work & pertinent test results, reviewed documented beta blocker date and time   Airway Mallampati: III  TM Distance: <3 FB     Dental  (+) Chipped, Caps   Pulmonary former smoker,           Cardiovascular hypertension, + Peripheral Vascular Disease  Normal cardiovascular exam+ dysrhythmias Supra Ventricular Tachycardia + Valvular Problems/Murmurs      Neuro/Psych Depression TIA   GI/Hepatic negative GI ROS, Neg liver ROS,   Endo/Other  negative endocrine ROS  Renal/GU negative Renal ROS     Musculoskeletal  (+) Arthritis , Osteoarthritis,    Abdominal Normal abdominal exam  (+)   Peds  Hematology negative hematology ROS (+)   Anesthesia Other Findings Past Medical History: No date: Dyslipidemia No date: Ectopic atrial tachycardia (HCC) No date: Environmental allergies No date: H/O scarlet fever No date: Heart murmur No date: HOH (hard of hearing) No date: Hyperlipidemia No date: Hypertension No date: Peripheral vascular disease (HCC) No date: SVT (supraventricular tachycardia) (HCC)     Comment: AVNRT. Status post ablation in July of 2012 by              Dr. Harvie BridgeSagi N. [place], [place]. No date: Transient cerebral ischemia On O2 presently and eliquis  Reproductive/Obstetrics                             Anesthesia Physical Anesthesia Plan  ASA: III and emergent  Anesthesia Plan: General   Post-op Pain Management:    Induction: Intravenous, Rapid sequence and Cricoid pressure planned  Airway Management Planned: Oral ETT  Additional Equipment:   Intra-op Plan:   Post-operative Plan: Extubation in OR  Informed Consent: I have reviewed the patients History and Physical, chart, labs and discussed the procedure including  the risks, benefits and alternatives for the proposed anesthesia with the patient or authorized representative who has indicated his/her understanding and acceptance.   Dental advisory given  Plan Discussed with: CRNA and Surgeon  Anesthesia Plan Comments:         Anesthesia Quick Evaluation

## 2016-04-10 NOTE — Anesthesia Procedure Notes (Signed)
Procedure Name: Intubation Date/Time: 04/10/2016 4:36 PM Performed by: Ginger CarneMICHELET, Deveney Bayon Pre-anesthesia Checklist: Patient identified, Emergency Drugs available, Suction available, Patient being monitored and Timeout performed Patient Re-evaluated:Patient Re-evaluated prior to inductionOxygen Delivery Method: Circle system utilized Preoxygenation: Pre-oxygenation with 100% oxygen Intubation Type: IV induction, Rapid sequence and Cricoid Pressure applied Laryngoscope Size: Miller and 2 Grade View: Grade II Tube type: Oral Tube size: 7.0 mm Number of attempts: 1 Airway Equipment and Method: Stylet Placement Confirmation: ETT inserted through vocal cords under direct vision,  positive ETCO2 and breath sounds checked- equal and bilateral Secured at: 21 cm Tube secured with: Tape Dental Injury: Teeth and Oropharynx as per pre-operative assessment

## 2016-04-10 NOTE — Discharge Instructions (Addendum)
Keep the immobilizer on at all times. Okay to loosen wrist strap to bend and straighten elbow but do not take hand away from abdomen. Pain medicine as directed. Follow up in 2 weeks for x-ray. no driving  AMBULATORY SURGERY  DISCHARGE INSTRUCTIONS   1) The drugs that you were given will stay in your system until tomorrow so for the next 24 hours you should not:  A) Drive an automobile B) Make any legal decisions C) Drink any alcoholic beverage   2) You may resume regular meals tomorrow.  Today it is better to start with liquids and gradually work up to solid foods.  You may eat anything you prefer, but it is better to start with liquids, then soup and crackers, and gradually work up to solid foods.   3) Please notify your doctor immediately if you have any unusual bleeding, trouble breathing, redness and pain at the surgery site, drainage, fever, or pain not relieved by medication.    4) Additional Instructions:        Please contact your physician with any problems or Same Day Surgery at [phone], Monday through Friday 6 am to 4 pm, or [place] at [facility] Main number at [phone].

## 2016-04-10 NOTE — ED Notes (Signed)
OR tech at bedside to transport patient to OR; daughter at bedside

## 2016-04-10 NOTE — ED Notes (Signed)
Pt daughter at door for help. Pt hurting in R shoulder and wanted to ambulate around room to see is pain eased up. Pt ambulated with assistance from this tech. Dr.Schaevitz made aware pt is in pain and will be in to see pt shortly.

## 2016-04-10 NOTE — ED Triage Notes (Signed)
Pt c/o right arm pain after falling off of a stool last night. Unable to raise right arm. LROM.

## 2016-04-10 NOTE — ED Provider Notes (Signed)
[facility] Emergency Department Provider Note  ____________________________________________  Time seen: Approximately 11:27 AM  I have reviewed the triage vital signs and the nursing notes.   HISTORY  Chief Complaint Arm Injury    HPI Ashley Hill is a 81 y.o. female, NAD, who presents to the emergency department for one day history of right arm pain. Patient reports that yesterday evening she was standing on a 12 inch step stool when she slipped and fell onto the marble countertop landing on her right arm outstretched. She states that at the time of injury the pain wasn't bad and took a tylenol and went to bed. However, she reports that this morning the pain got worse and is now a "15/10" and is associated with swelling, redness, and inability to move the upper extremity. She denies any numbness, tingling, or loss of sensation in the right upper extremity. She denies any head trauma or loss of consciousness related to the event.   Past Medical History:  Diagnosis Date  . Dyslipidemia   . Ectopic atrial tachycardia (HCC)   . Environmental allergies   . H/O scarlet fever   . Heart murmur   . HOH (hard of hearing)   . Hyperlipidemia   . Hypertension   . Peripheral vascular disease (HCC)   . 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 Harvie Bridge, [place].  . Transient cerebral ischemia     Patient Active Problem List   Diagnosis Date Noted  . Intermittent claudication (HCC) 06/30/2015  . Polymyalgia rheumatica (HCC) 10/27/2014  . Carotid artery narrowing 10/05/2014  . Atrioventricular nodal re-entry tachycardia (HCC) 10/05/2014  . Lesion of right parietal bone 01/27/2014  . Left sided numbness 01/05/2014  . Medicare annual wellness visit, initial 12/09/2013  . Bleeding 12/09/2013  . Dizziness and giddiness 12/09/2013  . Preop cardiovascular exam 04/21/2013  . Arthritis, degenerative  10/23/2012  . Chest pain 07/22/2012  . SVT (supraventricular tachycardia) (HCC)   . Hypertension   . Hyperlipidemia     Past Surgical History:  Procedure Laterality Date  . ABLATION OF DYSRHYTHMIC FOCUS    . AUGMENTATION MAMMAPLASTY Bilateral   . JOINT REPLACEMENT     knee  . KNEE ARTHROSCOPY W/ MENISCAL REPAIR     Right   . PERIPHERAL VASCULAR CATHETERIZATION Right 01/11/2015   Procedure: Lower Extremity Angiography;  Surgeon: Annice Needy, MD;  Location: [facility] INVASIVE CV LAB;  Service: Cardiovascular;  Laterality: Right;  . PERIPHERAL VASCULAR CATHETERIZATION Right 06/21/2015   Procedure: Lower Extremity Angiography;  Surgeon: Annice Needy, MD;  Location: [facility] INVASIVE CV LAB;  Service: Cardiovascular;  Laterality: Right;  . PERIPHERAL VASCULAR CATHETERIZATION Right 06/21/2015   Procedure: Lower Extremity Intervention;  Surgeon: Annice Needy, MD;  Location: [facility] INVASIVE CV LAB;  Service: Cardiovascular;  Laterality: Right;  . PERIPHERAL VASCULAR CATHETERIZATION Right 10/12/2015   Procedure: Lower Extremity Angiography;  Surgeon: Annice Needy, MD;  Location: [facility] INVASIVE CV LAB;  Service: Cardiovascular;  Laterality: Right;  . PERIPHERAL VASCULAR CATHETERIZATION Right 10/12/2015   Procedure: Lower Extremity Intervention;  Surgeon: Annice Needy, MD;  Location: [facility] INVASIVE CV LAB;  Service: Cardiovascular;  Laterality: Right;  . PERIPHERAL VASCULAR CATHETERIZATION Left 02/10/2016   Procedure: Lower Extremity Angiography;  Surgeon: Annice Needy, MD;  Location: [facility] INVASIVE CV LAB;  Service: Cardiovascular;  Laterality: Left;  . PERIPHERAL VASCULAR CATHETERIZATION  02/10/2016   Procedure: Lower Extremity Intervention;  Surgeon: Annice NeedyJason S Dew, MD;  [facility];  Service: Cardiovascular;;  . PERIPHERAL VASCULAR CATHETERIZATION Right 02/24/2016   Procedure: Lower Extremity Angiography;  Surgeon: Annice NeedyJason S Dew, MD;  Location: [facility] INVASIVE CV LAB;  Service: Cardiovascular;  Laterality:  Right;  . TONSILLECTOMY    . TOTAL KNEE ARTHROPLASTY  2015  . VAGINAL HYSTERECTOMY      Prior to Admission medications   Medication Sig Start Date End Date Taking? Authorizing Provider  apixaban (ELIQUIS) 5 MG TABS tablet Take 1 tablet (5 mg total) by mouth 2 (two) times daily. 10/12/15   Annice NeedyJason S Dew, MD  aspirin 81 MG tablet Take 81 mg by mouth daily.    Historical Provider, MD  b complex vitamins tablet Take 1 tablet by mouth daily.    Historical Provider, MD  CALCIUM PO Take 750 mg by mouth 2 (two) times daily.    Historical Provider, MD  carvedilol (COREG) 25 MG tablet Take 25 mg by mouth 2 (two) times daily with a meal.    Historical Provider, MD  cholecalciferol (VITAMIN D) 1000 units tablet Take 2,000 Units by mouth daily.    Historical Provider, MD  Coenzyme Q10 (COQ10) 50 MG CAPS Take 1 capsule by mouth daily.    Historical Provider, MD  CRESTOR 5 MG tablet TAKE 1 TABLET BY MOUTH EVERY DAY 04/14/14   Iran OuchMuhammad A Arida, MD  glucosamine-chondroitin 500-400 MG tablet Take 1,500 tablets by mouth 2 (two) times daily. Reported on 10/12/2015    Historical Provider, MD  hydrochlorothiazide (HYDRODIURIL) 25 MG tablet TAKE 1 TABLET BY MOUTH EVERY DAY Patient not taking: Reported on 03/10/2016 03/04/15   [place] OuchMuhammad A Arida, MD  hydrochlorothiazide (HYDRODIURIL) 25 MG tablet TAKE 1 TABLET BY MOUTH EVERY DAY Patient not taking: Reported on 03/10/2016 05/03/15   [place] OuchMuhammad A Arida, MD  ibuprofen (ADVIL,MOTRIN) 200 MG tablet Take 200 mg by mouth every 6 (six) hours as needed.    Historical Provider, MD  irbesartan (AVAPRO) 300 MG tablet Take 1 tablet (300 mg total) by mouth daily. Patient not taking: Reported on 03/10/2016 07/07/14   [place] OuchMuhammad A Arida, MD  NON FORMULARY Take 450 mg by mouth daily.    Historical Provider, MD  Omega-3 Fatty Acids (ULTRA OMEGA-3 FISH OIL PO) Take 1,200 mg by mouth daily.     Historical Provider, MD  oxyCODONE-acetaminophen (PERCOCET) 5-325 MG tablet Take 2 tablets by mouth  every 6 (six) hours as needed for moderate pain or severe pain. Patient not taking: Reported on 03/10/2016 06/22/15   Emily FilbertJonathan E Williams, MD  Red Yeast Rice Extract 600 MG CAPS Take 2 capsules by mouth.    Historical Provider, MD  traMADol (ULTRAM) 50 MG tablet Take 1 tablet (50 mg total) by mouth every 6 (six) hours as needed. Patient not taking: Reported on 03/10/2016 02/24/16   Annice NeedyJason S Dew, MD    Allergies Penicillins and Statins  Family History  Problem Relation Age of Onset  . Alcohol abuse Father   . Cancer Father   . Diabetes Paternal Grandfather   . Breast cancer Neg Hx     Social History Social History  Substance Use Topics  . Smoking status: Former Smoker    Packs/day: 1.00    Years: 10.00    Types: Cigarettes    Quit date: 02/23/1981  . Smokeless tobacco: Never Used  . Alcohol use No     Review of Systems  Eyes: No visual changes. Cardiovascular: No chest pain. Respiratory: No  shortness of breath. No wheezing.  Musculoskeletal: Positive for right upper arm pain. Negative for back or neck pain.  Skin: Positive for redness and swelling of the right upper arm. Negative for rash or bruising.No lacerations. Neurological: Negative for headaches, focal weakness or numbness. No dizziness. No numbness, tingling, or loss of sensation in right upper extremity.  10-point ROS otherwise negative.  ____________________________________________   PHYSICAL EXAM:  VITAL SIGNS: ED Triage Vitals  Enc Vitals Group     BP 04/10/16 1115 (!) 194/85     Pulse Rate 04/10/16 1115 (!) 106     Resp 04/10/16 1115 18     Temp 04/10/16 1115 97.8 F (36.6 C)     Temp Source 04/10/16 1115 Oral     SpO2 04/10/16 1115 96 %     Weight --      Height --      Head Circumference --      Peak Flow --      Pain Score 04/10/16 1055 10     Pain Loc --      Pain Edu? --      Excl. in GC? --      Constitutional: Alert and oriented. Well appearing and in no acute distress. Eyes:  Conjunctivae are normal Without icterus, injection or hemorrhage. Head: Atraumatic. Neck: No cervical spine tenderness to palpation. Supple with full range of motion. Cardiovascular: Good peripheral circulation with 2+ distal pulses noted in the right upper extremity. Capillary refill is brisk in all digits of the right hand. Respiratory: Normal respiratory effort without tachypnea or retractions.  Musculoskeletal: Non-tender to palpation of forearm, wrist, and hand. Tender to palpation of right upper arm and anterior shoulder. Right shoulder is slightly more prominent anteriorly as compared to the left. Range of motion of right upper shoulder and upper extremity decreased due to pain. Full range of motion of right hand and wrist without pain or difficulty. Grip strength in the right hand is 5 out of 5. Neurologic:  Normal speech and language. No gross focal neurologic deficits are appreciated. Sensation to light touch grossly intact about the right upper extremity. Skin:  Skin is warm, dry and intact. Mild swelling to the right upper extremity with trace erythema. No ecchymosis, open wounds or lacerations.  Psychiatric: Mood and affect are normal. Speech and behavior are normal. Patient exhibits appropriate insight and judgement.   ____________________________________________   LABS  None ____________________________________________  EKG  None ____________________________________________  RADIOLOGY I, Hope Pigeon, personally viewed and evaluated these images (plain radiographs) as part of my medical decision making, as well as reviewing the written report by the radiologist.  Dg Humerus Right  Result Date: 04/10/2016 CLINICAL DATA:  Fall, landing on right arm.  Mid humerus pain. EXAM: RIGHT HUMERUS - 2+ VIEW COMPARISON:  None. FINDINGS: There is anterior dislocation of the right humeral head. Impaction fracture noted along the humeral head with multiple fracture fragments. IMPRESSION:  Anterior right shoulder dislocation with Hill-Sachs fracture. Multiple displaced fracture fragments. Electronically Signed   By: Charlett Nose M.D.   On: 04/10/2016 11:51    ____________________________________________    PROCEDURES  Procedure(s) performed: None   Procedures   Medications - No data to display   ____________________________________________   INITIAL IMPRESSION / ASSESSMENT AND PLAN / ED COURSE  Pertinent labs & imaging results that were available during my care of the patient were reviewed by me and considered in my medical decision making (see chart for details).  Clinical Course  as of Apr 10 1230  Mon Apr 10, 2016  1203 Patient will be moved to Room #13 for procedure  [JH]    Clinical Course User Index [JH] Hope PigeonJami L Braylin Xu, PA-C       NEW MEDICATIONS STARTED DURING THIS VISIT:  New Prescriptions   No medications on file         Hope PigeonJami L Jahlisa Rossitto, PA-C 04/10/16 1234    Sharman CheekPhillip Stafford, MD 04/11/16 [phone]

## 2016-04-10 NOTE — Transfer of Care (Signed)
Immediate Anesthesia Transfer of Care Note  Patient: Ernst SpellBetty J Krausz  Procedure(s) Performed: Procedure(s): CLOSED REDUCTION SHOULDER (Right)  Patient Location: PACU  Anesthesia Type:General  Level of Consciousness: awake  Airway & Oxygen Therapy: Patient Spontanous Breathing and Patient connected to face mask oxygen  Post-op Assessment: Report given to RN and Post -op Vital signs reviewed and stable  Post vital signs: Reviewed and stable  Last Vitals:  Vitals:   04/10/16 1515 04/10/16 1653  BP: (!) 199/80 (!) 164/85  Pulse: 79 91  Resp: 20 18  Temp:  36.4 C    Last Pain:  Vitals:   04/10/16 1500  TempSrc: Oral  PainSc:          Complications: No apparent anesthesia complications

## 2016-04-10 NOTE — Sedation Documentation (Signed)
DR. Rosita KeaMenz at bedside for reduction with Dr. Langston MaskerSHaevitz

## 2016-04-10 NOTE — Op Note (Signed)
04/10/2016  4:43 PM  PATIENT:  Ashley Hill  81 y.o. female  PRE-OPERATIVE DIAGNOSIS:  DISLOCATED SHOULDER, with fracture  POST-OPERATIVE DIAGNOSIS: Anterior inferior glenohumeral fracture dislocation  PROCEDURE:  Close reduction right shoulder with fluoroscopy  SURGEON: Leitha SchullerMichael J Sairah Knobloch, MD  ASSISTANTS: None  ANESTHESIA:   general  EBL:  Total I/O In: 500 [I.V.:500] Out: -   BLOOD ADMINISTERED:none  DRAINS: none   LOCAL MEDICATIONS USED:  NONE  SPECIMEN:  No Specimen  DISPOSITION OF SPECIMEN:  N/A  COUNTS:  NO Procedure was closed there was no counts for this case  TOURNIQUET:  * No tourniquets in log *  IMPLANTS: None  DICTATION: .Dragon Dictation patient brought the operating room and after adequate general anesthesia was obtained appropriate patient education timeout procedure were completed. C-arm was brought in and with longitudinal traction the glenoid was fit well visualized and the head was separated from it with additional traction and rotation the head was brought more anterior and superior with internal rotation the shoulder reduced well with the tuberosity fragments near anatomic the patient was then placed and the shoulder immobilizer with redo shoulder. X-ray showing this was taken and saved off the C-arm  PLAN OF CARE: Discharge to home after PACU  PATIENT DISPOSITION:  PACU - hemodynamically stable.

## 2016-04-10 NOTE — H&P (Signed)
Subjective:   Patient is a 81 y.o. female presents with Right shoulder pain. Onset of symptoms was abrupt starting Last night after a fall About 16 hours  ago with gradually worsening course since that time. The pain is located around the right shoulder. Patient describes the pain as sharp continuous and rated as moderate and severe. Pain has been associated with her fall last night that she did not initially have a great deal of pain. Patient denies loss of consciousness. Symptoms are aggravated by her motion. Symptoms improve with nothing. Past history includes no prior surgery on shoulder.  Previous studies include x-rays in the ER showing a fracture dislocation attempted reductions in the emergency room under sedation were unsuccessful.  Patient Active Problem List   Diagnosis Date Noted  . Intermittent claudication (HCC) 06/30/2015  . Polymyalgia rheumatica (HCC) 10/27/2014  . Carotid artery narrowing 10/05/2014  . Atrioventricular nodal re-entry tachycardia (HCC) 10/05/2014  . Lesion of right parietal bone 01/27/2014  . Left sided numbness 01/05/2014  . Medicare annual wellness visit, initial 12/09/2013  . Bleeding 12/09/2013  . Dizziness and giddiness 12/09/2013  . Preop cardiovascular exam 04/21/2013  . Arthritis, degenerative 10/23/2012  . Chest pain 07/22/2012  . SVT (supraventricular tachycardia) (HCC)   . Hypertension   . Hyperlipidemia    Past Medical History:  Diagnosis Date  . Dyslipidemia   . Ectopic atrial tachycardia (HCC)   . Environmental allergies   . H/O scarlet fever   . Heart murmur   . HOH (hard of hearing)   . Hyperlipidemia   . Hypertension   . Peripheral vascular disease (HCC)   . 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 Harvie BridgeSagi N. [place], [place].  . Transient cerebral ischemia     Past Surgical History:  Procedure Laterality Date  . ABLATION OF DYSRHYTHMIC FOCUS    . AUGMENTATION MAMMAPLASTY  Bilateral   . JOINT REPLACEMENT     knee  . KNEE ARTHROSCOPY W/ MENISCAL REPAIR     Right   . PERIPHERAL VASCULAR CATHETERIZATION Right 01/11/2015   Procedure: Lower Extremity Angiography;  Surgeon: Annice NeedyJason S Dew, MD;  Location: [facility] INVASIVE CV LAB;  Service: Cardiovascular;  Laterality: Right;  . PERIPHERAL VASCULAR CATHETERIZATION Right 06/21/2015   Procedure: Lower Extremity Angiography;  Surgeon: Annice NeedyJason S Dew, MD;  Location: [facility] INVASIVE CV [facility];  Service: Cardiovascular;  Laterality: Right;  . PERIPHERAL VASCULAR CATHETERIZATION Right 06/21/2015   Procedure: Lower Extremity Intervention;  Surgeon: Annice NeedyJason S Dew, MD;  Location: [facility] INVAS[facility] CV [facility];  Service: Cardiovascular;  Laterality: Right;  . PERIPHERAL VASCULAR CATHETERIZATION Right 10/12/2015   Procedure: Lower Extremity Angiography;  Surgeon: Annice NeedyJason S Dew, MD;  Location: [facility] INV[facility];  Service: Cardiovascular;  Laterality: Right;  . PERIPHERAL VASCULAR CATHETERIZATION Right 10/12/2015   Procedure: Lower Extremity Intervention;  Surgeon: Annice NeedyJason S Dew, MD;  Location: [facility] IN[facility];  Service: Cardiovascular;  Laterality: Right;  . PERIPHERAL VASCULAR CATHETERIZATION Left 02/10/2016   Procedure: Lower Extremity Angiography;  Surgeon: Annice NeedyJason S Dew, MD;  Location: [facility];  Service: Cardiovascular;  Laterality: Left;  . PERIPHERAL VASCULAR CATHETERIZATION  02/10/2016   Procedure: Lower Extremity Intervention;  Surgeon: Annice NeedyJason S Dew, MD;  Location: [facility];  Service: Cardiovascular;;  . PERIPHERAL VASCULAR CATHETERIZATION Right 02/24/2016   Procedure: Lower Extremity Angiography;  Surgeon: Annice NeedyJason S Dew, MD;  Location: [facility];  Service: Cardiovascular;  Laterality: Right;  .  TONSILLECTOMY    . TOTAL KNEE ARTHROPLASTY  2015  . VAGINAL HYSTERECTOMY       (Not in a hospital admission) Allergies  Allergen Reactions  . Penicillins   . Statins     Social History  Substance Use Topics  . Smoking  status: Former Smoker    Packs/day: 1.00    Years: 10.00    Types: Cigarettes    Quit date: 02/23/1981  . Smokeless tobacco: Never Used  . Alcohol use No    Family History  Problem Relation Age of Onset  . Alcohol abuse Father   . Cancer Father   . Diabetes Paternal Grandfather   . Breast cancer Neg Hx     Review of Systems Pertinent items are noted in HPI.  Objective:   Patient Vitals for the past 8 hrs:  BP Temp Temp src Pulse Resp SpO2  04/10/16 1445 (!) 198/71 - - 85 18 100 %  04/10/16 1430 (!) 204/72 - - 97 13 100 %  04/10/16 1418 (!) 173/113 - - 76 18 100 %  04/10/16 1415 (!) 173/113 97.9 F (36.6 C) Oral 72 13 100 %  04/10/16 1403 (!) 194/89 98.5 F (36.9 C) Oral 88 18 100 %  04/10/16 1400 (!) 194/89 - - 94 19 100 %  04/10/16 1357 (!) 164/94 - - 99 15 100 %  04/10/16 1351 (!) 124/107 - - 100 20 100 %  04/10/16 1346 (!) 168/126 - - 100 19 100 %  04/10/16 1338 (!) 180/81 97.8 F (36.6 C) Oral 87 18 100 %  04/10/16 1336 (!) 212/82 - - 89 16 100 %  04/10/16 1318 (!) 212/82 - - 91 19 99 %  04/10/16 1115 (!) 194/85 97.8 F (36.6 C) Oral (!) 106 18 96 %   No intake/output data recorded. No intake/output data recorded.    BP (!) 198/71 (BP Location: Left Arm)   Pulse 85   Temp 97.9 F (36.6 C) (Oral)   Resp 18   SpO2 100%  General appearance: alert, appears stated age and moderate distress Head: Normocephalic, without obvious abnormality, atraumatic Lungs: clear to auscultation bilaterally Heart: regular rate and rhythm, S1, S2 normal, no murmur, click, rub or gallop Extremities: Deformity to right shoulder with help patient mares absence of humeral head under the acromion. She is neurovascular intact  ECG: None.  Data ReviewRadiology review: Tuberosity fracture with anterior inferior glenohumeral dislocation  Assessment:   Active Problems:   * No active hospital problems. * Right shoulder dislocation with associated tuberosity fracture  Plan:    After unsuccessful reduction attempt in ER plan on closed possible open reduction with fluoroscopy and operating room

## 2016-04-10 NOTE — Anesthesia Postprocedure Evaluation (Signed)
Anesthesia Post Note  Patient: Ashley SpellBetty J Smeltz  Procedure(s) Performed: Procedure(s) (LRB): CLOSED REDUCTION SHOULDER (Right)  Patient location during evaluation: PACU Anesthesia Type: General Level of consciousness: awake and alert and oriented Pain management: pain level controlled Vital Signs Assessment: post-procedure vital signs reviewed and stable Respiratory status: spontaneous breathing Cardiovascular status: blood pressure returned to baseline Anesthetic complications: no    Last Vitals:  Vitals:   04/10/16 1738 04/10/16 1753  BP: (!) 187/66 (!) 152/58  Pulse: 77 65  Resp: 18 20  Temp:  36.9 C    Last Pain:  Vitals:   04/10/16 1753  TempSrc:   PainSc: 3                  Pietra Zuluaga

## 2016-04-10 NOTE — Anesthesia Preprocedure Evaluation (Addendum)
Anesthesia Evaluation  Patient identified by MRN, date of birth, ID band Patient awake    Reviewed: Allergy & Precautions, NPO status , Patient's Chart, lab work & pertinent test results, reviewed documented beta blocker date and time   Airway Mallampati: II       Dental   Pulmonary former smoker,    Pulmonary exam normal        Cardiovascular hypertension, Pt. on medications and Pt. on home beta blockers + Peripheral Vascular Disease  + Valvular Problems/Murmurs  Rhythm:Irregular Rate:Tachycardia     Neuro/Psych TIA   GI/Hepatic negative GI ROS, Neg liver ROS,   Endo/Other    Renal/GU      Musculoskeletal  (+) Arthritis , Osteoarthritis,  Dislocated shoulder   Abdominal Normal abdominal exam  (+)   Peds  Hematology   Anesthesia Other Findings Past Medical History: No date: Dyslipidemia No date: Ectopic atrial tachycardia (HCC) No date: Environmental allergies No date: H/O scarlet fever No date: Heart murmur No date: HOH (hard of hearing) No date: Hyperlipidemia No date: Hypertension No date: Peripheral vascular disease (HCC) No date: SVT (supraventricular tachycardia) (HCC)     Comment: AVNRT. Status post ablation in July of 2012 by              Dr. Harvie BridgeSagi N. [place], [place]. No date: Transient cerebral ischemia  Reproductive/Obstetrics                            Anesthesia Physical Anesthesia Plan  ASA: III  Anesthesia Plan: General   Post-op Pain Management:    Induction: Intravenous, Rapid sequence and Cricoid pressure planned  Airway Management Planned: Oral ETT  Additional Equipment:   Intra-op Plan:   Post-operative Plan: Extubation in OR  Informed Consent: I have reviewed the patients History and Physical, chart, labs and discussed the procedure including the risks, benefits and alternatives for the proposed anesthesia with the patient or authorized  representative who has indicated his/her understanding and acceptance.   Dental advisory given  Plan Discussed with: CRNA and Surgeon  Anesthesia Plan Comments:                                          Anesthesia Evaluation  Patient identified by MRN, date of birth, ID band Patient awake    Reviewed: Allergy & Precautions, NPO status , Patient's Chart, lab work & pertinent test results, reviewed documented beta blocker date and time   Airway Mallampati: III  TM Distance: <3 FB     Dental  (+) Chipped, Caps   Pulmonary former smoker,           Cardiovascular hypertension, + Peripheral Vascular Disease  Normal cardiovascular exam+ dysrhythmias Supra Ventricular Tachycardia + Valvular Problems/Murmurs      Neuro/Psych Depression TIA   GI/Hepatic negative GI ROS, Neg liver ROS,   Endo/Other  negative endocrine ROS  Renal/GU negative Renal ROS     Musculoskeletal  (+) Arthritis , Osteoarthritis,    Abdominal Normal abdominal exam  (+)   Peds  Hematology negative hematology ROS (+)   Anesthesia Other Findings Past Medical History: No date: Dyslipidemia No date: Ectopic atrial tachycardia (HCC) No date: Environmental allergies No date: H/O scarlet fever No date: Heart murmur No date: HOH (hard of hearing) No date: Hyperlipidemia No date: Hypertension No date: Peripheral vascular disease (HCC)  No date: SVT (supraventricular tachycardia) (HCC)     Comment: AVNRT. Status post ablation in July of 2012 by              Dr. Harvie BridgeSagi N. [place], [place]. No date: Transient cerebral ischemia On O2 presently and eliquis  Reproductive/Obstetrics                             Anesthesia Physical Anesthesia Plan  ASA: III and emergent  Anesthesia Plan: General   Post-op Pain Management:    Induction: Intravenous, Rapid sequence and Cricoid pressure planned  Airway Management Planned: Oral ETT  Additional Equipment:    Intra-op Plan:   Post-operative Plan: Extubation in OR  Informed Consent: I have reviewed the patients History and Physical, chart, labs and discussed the procedure including the risks, benefits and alternatives for the proposed anesthesia with the patient or authorized representative who has indicated his/her understanding and acceptance.   Dental advisory given  Plan Discussed with: CRNA and Surgeon  Anesthesia Plan Comments:         Anesthesia Quick Evaluation  Anesthesia Quick Evaluation

## 2016-04-11 ENCOUNTER — Encounter: Payer: Self-pay | Admitting: Orthopedic Surgery

## 2016-04-24 ENCOUNTER — Encounter: Payer: Self-pay | Admitting: *Deleted

## 2016-04-26 ENCOUNTER — Other Ambulatory Visit: Payer: Self-pay | Admitting: Orthopedic Surgery

## 2016-04-26 DIAGNOSIS — M21821 Other specified acquired deformities of right upper arm: Secondary | ICD-10-CM | POA: Diagnosis not present

## 2016-04-26 DIAGNOSIS — S43004A Unspecified dislocation of right shoulder joint, initial encounter: Secondary | ICD-10-CM

## 2016-04-26 DIAGNOSIS — M7591 Shoulder lesion, unspecified, right shoulder: Secondary | ICD-10-CM

## 2016-04-28 ENCOUNTER — Ambulatory Visit
Admission: RE | Admit: 2016-04-28 | Discharge: 2016-04-28 | Disposition: A | Discharge status: Home / Self Care | Payer: Medicare Other | Source: Ambulatory Visit | Attending: Orthopedic Surgery | Admitting: Orthopedic Surgery

## 2016-04-28 DIAGNOSIS — M19011 Primary osteoarthritis, right shoulder: Secondary | ICD-10-CM | POA: Insufficient documentation

## 2016-04-28 DIAGNOSIS — M21821 Other specified acquired deformities of right upper arm: Secondary | ICD-10-CM | POA: Insufficient documentation

## 2016-04-28 DIAGNOSIS — M25511 Pain in right shoulder: Secondary | ICD-10-CM | POA: Diagnosis not present

## 2016-04-28 DIAGNOSIS — M7551 Bursitis of right shoulder: Secondary | ICD-10-CM | POA: Diagnosis not present

## 2016-04-28 DIAGNOSIS — S40011A Contusion of right shoulder, initial encounter: Secondary | ICD-10-CM | POA: Diagnosis not present

## 2016-04-28 DIAGNOSIS — X58XXXA Exposure to other specified factors, initial encounter: Secondary | ICD-10-CM | POA: Insufficient documentation

## 2016-04-28 DIAGNOSIS — M7591 Shoulder lesion, unspecified, right shoulder: Secondary | ICD-10-CM | POA: Diagnosis present

## 2016-04-28 DIAGNOSIS — M75110 Incomplete rotator cuff tear or rupture of unspecified shoulder, not specified as traumatic: Secondary | ICD-10-CM | POA: Insufficient documentation

## 2016-04-28 DIAGNOSIS — S43004A Unspecified dislocation of right shoulder joint, initial encounter: Secondary | ICD-10-CM

## 2016-04-28 DIAGNOSIS — S43491A Other sprain of right shoulder joint, initial encounter: Secondary | ICD-10-CM | POA: Diagnosis not present

## 2016-05-10 DIAGNOSIS — W19XXXD Unspecified fall, subsequent encounter: Secondary | ICD-10-CM | POA: Diagnosis not present

## 2016-05-10 DIAGNOSIS — Z96651 Presence of right artificial knee joint: Secondary | ICD-10-CM | POA: Diagnosis not present

## 2016-05-10 DIAGNOSIS — I10 Essential (primary) hypertension: Secondary | ICD-10-CM | POA: Diagnosis not present

## 2016-05-10 DIAGNOSIS — M21821 Other specified acquired deformities of right upper arm: Secondary | ICD-10-CM | POA: Diagnosis not present

## 2016-05-10 DIAGNOSIS — S43004D Unspecified dislocation of right shoulder joint, subsequent encounter: Secondary | ICD-10-CM | POA: Diagnosis not present

## 2016-05-10 DIAGNOSIS — Z7901 Long term (current) use of anticoagulants: Secondary | ICD-10-CM | POA: Diagnosis not present

## 2016-05-12 DIAGNOSIS — W19XXXD Unspecified fall, subsequent encounter: Secondary | ICD-10-CM | POA: Diagnosis not present

## 2016-05-12 DIAGNOSIS — I10 Essential (primary) hypertension: Secondary | ICD-10-CM | POA: Diagnosis not present

## 2016-05-12 DIAGNOSIS — Z7901 Long term (current) use of anticoagulants: Secondary | ICD-10-CM | POA: Diagnosis not present

## 2016-05-12 DIAGNOSIS — Z96651 Presence of right artificial knee joint: Secondary | ICD-10-CM | POA: Diagnosis not present

## 2016-05-12 DIAGNOSIS — S43004D Unspecified dislocation of right shoulder joint, subsequent encounter: Secondary | ICD-10-CM | POA: Diagnosis not present

## 2016-05-12 DIAGNOSIS — M21821 Other specified acquired deformities of right upper arm: Secondary | ICD-10-CM | POA: Diagnosis not present

## 2016-05-16 DIAGNOSIS — W19XXXD Unspecified fall, subsequent encounter: Secondary | ICD-10-CM | POA: Diagnosis not present

## 2016-05-16 DIAGNOSIS — I10 Essential (primary) hypertension: Secondary | ICD-10-CM | POA: Diagnosis not present

## 2016-05-16 DIAGNOSIS — M21821 Other specified acquired deformities of right upper arm: Secondary | ICD-10-CM | POA: Diagnosis not present

## 2016-05-16 DIAGNOSIS — S43004D Unspecified dislocation of right shoulder joint, subsequent encounter: Secondary | ICD-10-CM | POA: Diagnosis not present

## 2016-05-16 DIAGNOSIS — Z7901 Long term (current) use of anticoagulants: Secondary | ICD-10-CM | POA: Diagnosis not present

## 2016-05-16 DIAGNOSIS — Z96651 Presence of right artificial knee joint: Secondary | ICD-10-CM | POA: Diagnosis not present

## 2016-05-19 DIAGNOSIS — Z96651 Presence of right artificial knee joint: Secondary | ICD-10-CM | POA: Diagnosis not present

## 2016-05-19 DIAGNOSIS — M21821 Other specified acquired deformities of right upper arm: Secondary | ICD-10-CM | POA: Diagnosis not present

## 2016-05-19 DIAGNOSIS — W19XXXD Unspecified fall, subsequent encounter: Secondary | ICD-10-CM | POA: Diagnosis not present

## 2016-05-19 DIAGNOSIS — S43004D Unspecified dislocation of right shoulder joint, subsequent encounter: Secondary | ICD-10-CM | POA: Diagnosis not present

## 2016-05-19 DIAGNOSIS — I10 Essential (primary) hypertension: Secondary | ICD-10-CM | POA: Diagnosis not present

## 2016-05-19 DIAGNOSIS — Z7901 Long term (current) use of anticoagulants: Secondary | ICD-10-CM | POA: Diagnosis not present

## 2016-05-23 DIAGNOSIS — S43004D Unspecified dislocation of right shoulder joint, subsequent encounter: Secondary | ICD-10-CM | POA: Diagnosis not present

## 2016-05-23 DIAGNOSIS — Z96651 Presence of right artificial knee joint: Secondary | ICD-10-CM | POA: Diagnosis not present

## 2016-05-23 DIAGNOSIS — M21821 Other specified acquired deformities of right upper arm: Secondary | ICD-10-CM | POA: Diagnosis not present

## 2016-05-23 DIAGNOSIS — Z7901 Long term (current) use of anticoagulants: Secondary | ICD-10-CM | POA: Diagnosis not present

## 2016-05-23 DIAGNOSIS — I10 Essential (primary) hypertension: Secondary | ICD-10-CM | POA: Diagnosis not present

## 2016-05-23 DIAGNOSIS — W19XXXD Unspecified fall, subsequent encounter: Secondary | ICD-10-CM | POA: Diagnosis not present

## 2016-05-26 DIAGNOSIS — I10 Essential (primary) hypertension: Secondary | ICD-10-CM | POA: Diagnosis not present

## 2016-05-26 DIAGNOSIS — W19XXXD Unspecified fall, subsequent encounter: Secondary | ICD-10-CM | POA: Diagnosis not present

## 2016-05-26 DIAGNOSIS — M21821 Other specified acquired deformities of right upper arm: Secondary | ICD-10-CM | POA: Diagnosis not present

## 2016-05-26 DIAGNOSIS — S43004D Unspecified dislocation of right shoulder joint, subsequent encounter: Secondary | ICD-10-CM | POA: Diagnosis not present

## 2016-05-26 DIAGNOSIS — Z96651 Presence of right artificial knee joint: Secondary | ICD-10-CM | POA: Diagnosis not present

## 2016-05-26 DIAGNOSIS — Z7901 Long term (current) use of anticoagulants: Secondary | ICD-10-CM | POA: Diagnosis not present

## 2016-06-06 ENCOUNTER — Other Ambulatory Visit: Payer: Self-pay | Admitting: Cardiovascular Disease

## 2016-06-06 DIAGNOSIS — I6523 Occlusion and stenosis of bilateral carotid arteries: Secondary | ICD-10-CM

## 2016-06-07 ENCOUNTER — Encounter: Payer: Self-pay | Admitting: Vascular Surgery

## 2016-06-07 MED ORDER — ALTEPLASE 2 MG IJ SOLR
INTRAMUSCULAR | Status: DC | PRN
  Administered 2016-02-24: 8 mg

## 2016-06-14 DIAGNOSIS — M25511 Pain in right shoulder: Secondary | ICD-10-CM | POA: Diagnosis not present

## 2016-06-14 DIAGNOSIS — M7501 Adhesive capsulitis of right shoulder: Secondary | ICD-10-CM | POA: Diagnosis not present

## 2016-06-15 ENCOUNTER — Ambulatory Visit: Payer: Medicare Other

## 2016-06-15 ENCOUNTER — Other Ambulatory Visit: Payer: Self-pay | Admitting: Cardiovascular Disease

## 2016-06-15 DIAGNOSIS — I6523 Occlusion and stenosis of bilateral carotid arteries: Secondary | ICD-10-CM | POA: Diagnosis not present

## 2016-06-15 DIAGNOSIS — R42 Dizziness and giddiness: Secondary | ICD-10-CM | POA: Diagnosis not present

## 2016-06-21 ENCOUNTER — Other Ambulatory Visit (INDEPENDENT_AMBULATORY_CARE_PROVIDER_SITE_OTHER): Payer: Self-pay

## 2016-06-26 DIAGNOSIS — M25511 Pain in right shoulder: Secondary | ICD-10-CM | POA: Diagnosis not present

## 2016-06-26 DIAGNOSIS — G8929 Other chronic pain: Secondary | ICD-10-CM | POA: Diagnosis not present

## 2016-06-29 DIAGNOSIS — M25511 Pain in right shoulder: Secondary | ICD-10-CM | POA: Diagnosis not present

## 2016-06-29 DIAGNOSIS — G8929 Other chronic pain: Secondary | ICD-10-CM | POA: Diagnosis not present

## 2016-07-03 DIAGNOSIS — M25511 Pain in right shoulder: Secondary | ICD-10-CM | POA: Diagnosis not present

## 2016-07-03 DIAGNOSIS — G8929 Other chronic pain: Secondary | ICD-10-CM | POA: Diagnosis not present

## 2016-07-06 DIAGNOSIS — M25511 Pain in right shoulder: Secondary | ICD-10-CM | POA: Diagnosis not present

## 2016-07-06 DIAGNOSIS — G8929 Other chronic pain: Secondary | ICD-10-CM | POA: Diagnosis not present

## 2016-07-10 DIAGNOSIS — M25511 Pain in right shoulder: Secondary | ICD-10-CM | POA: Diagnosis not present

## 2016-07-10 DIAGNOSIS — M21821 Other specified acquired deformities of right upper arm: Secondary | ICD-10-CM | POA: Diagnosis not present

## 2016-07-10 DIAGNOSIS — M7500 Adhesive capsulitis of unspecified shoulder: Secondary | ICD-10-CM | POA: Diagnosis not present

## 2016-07-10 DIAGNOSIS — S46811A Strain of other muscles, fascia and tendons at shoulder and upper arm level, right arm, initial encounter: Secondary | ICD-10-CM | POA: Diagnosis not present

## 2016-07-10 DIAGNOSIS — G8929 Other chronic pain: Secondary | ICD-10-CM | POA: Diagnosis not present

## 2016-07-10 DIAGNOSIS — S43004A Unspecified dislocation of right shoulder joint, initial encounter: Secondary | ICD-10-CM | POA: Diagnosis not present

## 2016-07-11 ENCOUNTER — Encounter (INDEPENDENT_AMBULATORY_CARE_PROVIDER_SITE_OTHER): Payer: Medicare Other

## 2016-07-11 ENCOUNTER — Ambulatory Visit (INDEPENDENT_AMBULATORY_CARE_PROVIDER_SITE_OTHER): Payer: Medicare Other | Admitting: Vascular Surgery

## 2016-07-20 DIAGNOSIS — G8929 Other chronic pain: Secondary | ICD-10-CM | POA: Diagnosis not present

## 2016-07-20 DIAGNOSIS — M25511 Pain in right shoulder: Secondary | ICD-10-CM | POA: Diagnosis not present

## 2016-07-21 ENCOUNTER — Encounter (INDEPENDENT_AMBULATORY_CARE_PROVIDER_SITE_OTHER): Payer: Self-pay | Admitting: Vascular Surgery

## 2016-07-21 ENCOUNTER — Ambulatory Visit (INDEPENDENT_AMBULATORY_CARE_PROVIDER_SITE_OTHER): Payer: Medicare Other | Admitting: Vascular Surgery

## 2016-07-21 VITALS — BP 146/78 | HR 74 | Resp 16 | Wt 145.4 lb

## 2016-07-21 DIAGNOSIS — M79604 Pain in right leg: Secondary | ICD-10-CM | POA: Diagnosis not present

## 2016-07-21 DIAGNOSIS — M79609 Pain in unspecified limb: Secondary | ICD-10-CM | POA: Insufficient documentation

## 2016-07-21 DIAGNOSIS — I739 Peripheral vascular disease, unspecified: Secondary | ICD-10-CM | POA: Diagnosis not present

## 2016-07-21 DIAGNOSIS — I10 Essential (primary) hypertension: Secondary | ICD-10-CM

## 2016-07-21 DIAGNOSIS — E785 Hyperlipidemia, unspecified: Secondary | ICD-10-CM | POA: Diagnosis not present

## 2016-07-21 DIAGNOSIS — I6523 Occlusion and stenosis of bilateral carotid arteries: Secondary | ICD-10-CM

## 2016-07-21 NOTE — Assessment & Plan Note (Signed)
The patient has some pain after a significant fall with some prominent varicosities. I do not think this is secondary to arterial insufficiency. It is possible she could've had a little bit of superficial thrombophlebitis, but they did not appear to have thrombophlebitis clinically today. Even if she does, she is artery on appropriate therapy with anticoagulation. Elevate, compression stockings, and warm compresses as needed. Return for her follow-up visit later this month to recheck her arterial perfusion.

## 2016-07-21 NOTE — Assessment & Plan Note (Signed)
lipid control important in reducing the progression of atherosclerotic disease. Continue statin therapy  

## 2016-07-21 NOTE — Assessment & Plan Note (Signed)
To be checked later this month.

## 2016-07-21 NOTE — Assessment & Plan Note (Signed)
blood pressure control important in reducing the progression of atherosclerotic disease. On appropriate oral medications.  

## 2016-07-21 NOTE — Progress Notes (Signed)
MRN : [id number]  Ashley Hill is a 82 y.o. ([birth year]) female who presents with chief complaint of  Chief Complaint  Patient presents with  . Follow-up  .  History of Present Illness: Patient returns today in follow up of her leg pain with peripheral arterial disease. She had a major fall including a clavicle fracture and a shoulder dislocation and developed some pain and swelling in her right calf area. She did have some prominent varicosities back in this location. There are not read. She does not have fever or chills. They're mildly tender to the touch but are not indurated. She is still not having the numbness and tingling she was having in her foot for revascularization. Her claudication symptoms are currently pretty mild.         Current Outpatient Prescriptions  Medication Sig Dispense Refill  . apixaban (ELIQUIS) 5 MG TABS tablet Take 1 tablet (5 mg total) by mouth 2 (two) times daily. 60 tablet 2  . aspirin 81 MG tablet Take 81 mg by mouth daily.    Marland Kitchen. b complex vitamins tablet Take 1 tablet by mouth daily.    Marland Kitchen. CALCIUM PO Take 750 mg by mouth 2 (two) times daily.    . carvedilol (COREG) 25 MG tablet Take 25 mg by mouth 2 (two) times daily with a meal.    . cholecalciferol (VITAMIN D) 1000 units tablet Take 2,000 Units by mouth daily.    . Coenzyme Q10 (COQ10) 50 MG CAPS Take 1 capsule by mouth daily.    . CRESTOR 5 MG tablet TAKE 1 TABLET BY MOUTH EVERY DAY 30 tablet 6  . ibuprofen (ADVIL,MOTRIN) 200 MG tablet Take 200 mg by mouth every 6 (six) hours as needed.    . Omega-3 Fatty Acids (ULTRA OMEGA-3 FISH OIL PO) Take 1,200 mg by mouth daily.     . Red Yeast Rice Extract 600 MG CAPS Take 2 capsules by mouth.    Marland Kitchen. glucosamine-chondroitin 500-400 MG tablet Take 1,500 tablets by mouth 2 (two) times daily. Reported on 10/12/2015    . hydrochlorothiazide (HYDRODIURIL) 25 MG tablet TAKE 1 TABLET BY MOUTH EVERY DAY (Patient not taking: Reported on 03/10/2016) 30  tablet 0  . hydrochlorothiazide (HYDRODIURIL) 25 MG tablet TAKE 1 TABLET BY MOUTH EVERY DAY (Patient not taking: Reported on 03/10/2016) 30 tablet 3  . irbesartan (AVAPRO) 300 MG tablet Take 1 tablet (300 mg total) by mouth daily. (Patient not taking: Reported on 03/10/2016) 30 tablet 6  . NON FORMULARY Take 450 mg by mouth daily.    Marland Kitchen. oxyCODONE-acetaminophen (PERCOCET) 5-325 MG tablet Take 2 tablets by mouth every 6 (six) hours as needed for moderate pain or severe pain. (Patient not taking: Reported on 03/10/2016) 30 tablet 0  . traMADol (ULTRAM) 50 MG tablet Take 1 tablet (50 mg total) by mouth every 6 (six) hours as needed. (Patient not taking: Reported on 03/10/2016) 20 tablet 0   No current facility-administered medications for this visit.         Past Medical History:  Diagnosis Date  . Dyslipidemia   . Ectopic atrial tachycardia (HCC)   . Environmental allergies   . H/O scarlet fever   . Heart murmur   . HOH (hard of hearing)   . Hyperlipidemia   . Hypertension   . Peripheral vascular disease (HCC)   . 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  .Marland Kitchen  Transient cerebral ischemia          Past Surgical History:  Procedure Laterality Date  . ABLATION OF DYSRHYTHMIC FOCUS    . AUGMENTATION MAMMAPLASTY Bilateral   . JOINT REPLACEMENT     knee  . KNEE ARTHROSCOPY W/ MENISCAL REPAIR     Right   . PERIPHERAL VASCULAR CATHETERIZATION Right 01/11/2015   Procedure: Lower Extremity Angiography;  Surgeon: Annice Needy, MD;  Location: [facility];  Service: Cardiovascular;  Laterality: Right;  . PERIPHERAL VASCULAR CATHETERIZATION Right 06/21/2015   Procedure: Lower Extremity Angiography;  Surgeon: Annice Needy, MD;  Location: [facility];  Service: Cardiovascular;  Laterality: Right;  . PERIPHERAL VASCULAR CATHETERIZATION Right 06/21/2015   Procedure: Lower  Extremity Intervention;  Surgeon: Annice Needy, MD;  Location: [facility];  Service: Cardiovascular;  Laterality: Right;  . PERIPHERAL VASCULAR CATHETERIZATION Right 10/12/2015   Procedure: Lower Extremity Angiography;  Surgeon: Annice Needy, MD;  Location: [facility];  Service: Cardiovascular;  Laterality: Right;  . PERIPHERAL VASCULAR CATHETERIZATION Right 10/12/2015   Procedure: Lower Extremity Intervention;  Surgeon: Annice Needy, MD;  Location: [facility];  Service: Cardiovascular;  Laterality: Right;  . PERIPHERAL VASCULAR CATHETERIZATION Left 02/10/2016   Procedure: Lower Extremity Angiography;  Surgeon: Annice Needy, MD;  Location: [facility];  Service: Cardiovascular;  Laterality: Left;  . PERIPHERAL VASCULAR CATHETERIZATION  02/10/2016   Procedure: Lower Extremity Intervention;  Surgeon: Annice Needy, MD;  Location: [facility];  Service: Cardiovascular;;  . PERIPHERAL VASCULAR CATHETERIZATION Right 02/24/2016   Procedure: Lower Extremity Angiography;  Surgeon: Annice Needy, MD;  Location: [facility];  Service: Cardiovascular;  Laterality: Right;  . TONSILLECTOMY    . TOTAL KNEE ARTHROPLASTY  2015  . VAGINAL HYSTERECTOMY      Social History      Social History  Substance Use Topics  . Smoking status: Former Smoker    Packs/day: 1.00    Years: 10.00    Types: Cigarettes    Quit date: 02/23/1981  . Smokeless tobacco: Never Used  . Alcohol use No     Family History      Family History  Problem Relation Age of Onset  . Alcohol abuse Father   . Cancer Father   . Diabetes Paternal Grandfather   . Breast cancer Neg Hx         Allergies  Allergen Reactions  . Penicillins   . Statins      REVIEW OF SYSTEMS (Negative unless checked)  Constitutional: [] Weight loss  [] Fever  [] Chills Cardiac: [] Chest pain   [] Chest pressure   [x] Palpitations   [] Shortness of breath when laying flat    [] Shortness of breath at rest   [] Shortness of breath with exertion. Vascular:  [x] Pain in legs with walking   [] Pain in legs at rest   [] Pain in legs when laying flat   [] Claudication   [] Pain in feet when walking  [] Pain in feet at rest  [] Pain in feet when laying flat   [] History of DVT   [] Phlebitis   [] Swelling in legs   [] Varicose veins   [] Non-healing ulcers Pulmonary:   [] Uses home oxygen   [] Productive cough   [] Hemoptysis   [] Wheeze  [] COPD   [] Asthma Neurologic:  [] Dizziness  [] Blackouts   [] Seizures   [] History of stroke   [] History of TIA  [] Aphasia   [] Temporary blindness   [] Dysphagia   []   Weakness or numbness in arms   [x] Weakness or numbness in legs Musculoskeletal:  [] Arthritis   [] Joint swelling   [] Joint pain   [] Low back pain Hematologic:  [] Easy bruising  [] Easy bleeding   [] Hypercoagulable state   [] Anemic   Gastrointestinal:  [] Blood in stool   [] Vomiting blood  [] Gastroesophageal reflux/heartburn   [] Abdominal pain Genitourinary:  [] Chronic kidney disease   [] Difficult urination  [] Frequent urination  [] Burning with urination   [] Hematuria Skin:  [] Rashes   [] Ulcers   [] Wounds Psychological:  [] History of anxiety   []  History of major depression.  Physical Examination  BP (!) 162/80   Pulse 97   Resp 17   Ht 5' 5.5" (1.664 m)   Wt 63.5 kg (140 lb)   BMI 22.94 kg/m  Gen:  WD/WN, NAD. Appears younger than stated age Head: [place] /AT, No temporalis wasting. Ear/Nose/Throat: Hearing grossly intact, nares w/o erythema or drainage, trachea midline Eyes: Conjunctiva clear. Sclera non-icteric Neck: Supple.  No JVD.  Pulmonary:  Good air movement, no use of accessory muscles.  Cardiac: RRR, normal S1, S2 Vascular:  Vessel Right Left  Radial Palpable Palpable  Ulnar Palpable Palpable  Brachial Palpable Palpable  Carotid Palpable, without bruit Palpable, without bruit  Aorta Not palpable N/A  Femoral Palpable Palpable  Popliteal Palpable Palpable  PT 1+ Palpable Palpable   DP Palpable 1+ Palpable   Gastrointestinal: soft, non-tender/non-distended. No guarding/reflex.  Musculoskeletal: M/S 5/5 throughout.  No deformity or atrophy. No significant lower extremity edema.She does have some prominent varicosities in the area of pain in the right posterior and lateral calf. These varicosities are not indurated or erythematous. Neurologic: Sensation grossly intact in extremities.  Symmetrical.  Speech is fluent.  Psychiatric: Judgment intact, Mood & affect appropriate for pt's clinical situation. Dermatologic: No rashes or ulcers noted.  No cellulitis or open wounds. Lymph : No Cervical, Axillary, or Inguinal lymphadenopathy.      Labs No results found for this or any previous visit (from the past 2160 hour(s)).  Radiology No results found.   Assessment/Plan  Hyperlipidemia lipid control important in reducing the progression of atherosclerotic disease. Continue statin therapy   Hypertension blood pressure control important in reducing the progression of atherosclerotic disease. On appropriate oral medications.   Pain in limb The patient has some pain after a significant fall with some prominent varicosities. I do not think this is secondary to arterial insufficiency. It is possible she could've had a little bit of superficial thrombophlebitis, but they did not appear to have thrombophlebitis clinically today. Even if she does, she is artery on appropriate therapy with anticoagulation. Elevate, compression stockings, and warm compresses as needed. Return for her follow-up visit later this month to recheck her arterial perfusion.  Intermittent claudication (HCC) To be checked later this month.    Festus Barren, MD  07/21/2016 9:51 AM    This note was created with Dragon medical transcription system.  Any errors from dictation are purely unintentional

## 2016-07-25 ENCOUNTER — Encounter: Payer: Self-pay | Admitting: Emergency Medicine

## 2016-07-25 ENCOUNTER — Ambulatory Visit (INDEPENDENT_AMBULATORY_CARE_PROVIDER_SITE_OTHER): Payer: Medicare Other | Admitting: Vascular Surgery

## 2016-07-25 ENCOUNTER — Telehealth (INDEPENDENT_AMBULATORY_CARE_PROVIDER_SITE_OTHER): Payer: Self-pay

## 2016-07-25 ENCOUNTER — Emergency Department: Payer: Medicare Other

## 2016-07-25 ENCOUNTER — Inpatient Hospital Stay
Admission: EM | Admit: 2016-07-25 | Discharge: 2016-07-28 | DRG: 254 | Disposition: A | Discharge status: Home / Self Care | Payer: Medicare Other | Attending: Vascular Surgery | Admitting: Vascular Surgery

## 2016-07-25 DIAGNOSIS — E785 Hyperlipidemia, unspecified: Secondary | ICD-10-CM | POA: Diagnosis present

## 2016-07-25 DIAGNOSIS — Z888 Allergy status to other drugs, medicaments and biological substances status: Secondary | ICD-10-CM | POA: Diagnosis not present

## 2016-07-25 DIAGNOSIS — R930 Abnormal findings on diagnostic imaging of skull and head, not elsewhere classified: Secondary | ICD-10-CM | POA: Diagnosis not present

## 2016-07-25 DIAGNOSIS — I779 Disorder of arteries and arterioles, unspecified: Secondary | ICD-10-CM | POA: Diagnosis not present

## 2016-07-25 DIAGNOSIS — I10 Essential (primary) hypertension: Secondary | ICD-10-CM | POA: Diagnosis present

## 2016-07-25 DIAGNOSIS — Z880 Allergy status to penicillin: Secondary | ICD-10-CM

## 2016-07-25 DIAGNOSIS — Z87891 Personal history of nicotine dependence: Secondary | ICD-10-CM | POA: Diagnosis not present

## 2016-07-25 DIAGNOSIS — F1010 Alcohol abuse, uncomplicated: Secondary | ICD-10-CM

## 2016-07-25 DIAGNOSIS — I742 Embolism and thrombosis of arteries of the upper extremities: Secondary | ICD-10-CM | POA: Diagnosis not present

## 2016-07-25 DIAGNOSIS — Z7982 Long term (current) use of aspirin: Secondary | ICD-10-CM | POA: Diagnosis not present

## 2016-07-25 DIAGNOSIS — R230 Cyanosis: Secondary | ICD-10-CM | POA: Diagnosis not present

## 2016-07-25 DIAGNOSIS — H9190 Unspecified hearing loss, unspecified ear: Secondary | ICD-10-CM | POA: Diagnosis present

## 2016-07-25 DIAGNOSIS — F0280 Dementia in other diseases classified elsewhere without behavioral disturbance: Secondary | ICD-10-CM

## 2016-07-25 DIAGNOSIS — G309 Alzheimer's disease, unspecified: Secondary | ICD-10-CM | POA: Diagnosis not present

## 2016-07-25 DIAGNOSIS — Z95820 Peripheral vascular angioplasty status with implants and grafts: Secondary | ICD-10-CM | POA: Diagnosis not present

## 2016-07-25 DIAGNOSIS — E7800 Pure hypercholesterolemia, unspecified: Secondary | ICD-10-CM | POA: Diagnosis present

## 2016-07-25 DIAGNOSIS — Z8673 Personal history of transient ischemic attack (TIA), and cerebral infarction without residual deficits: Secondary | ICD-10-CM | POA: Diagnosis not present

## 2016-07-25 DIAGNOSIS — I739 Peripheral vascular disease, unspecified: Secondary | ICD-10-CM

## 2016-07-25 DIAGNOSIS — Z7901 Long term (current) use of anticoagulants: Secondary | ICD-10-CM | POA: Diagnosis not present

## 2016-07-25 DIAGNOSIS — I70221 Atherosclerosis of native arteries of extremities with rest pain, right leg: Secondary | ICD-10-CM | POA: Diagnosis not present

## 2016-07-25 DIAGNOSIS — M79604 Pain in right leg: Secondary | ICD-10-CM | POA: Diagnosis not present

## 2016-07-25 DIAGNOSIS — I998 Other disorder of circulatory system: Secondary | ICD-10-CM | POA: Diagnosis present

## 2016-07-25 LAB — BASIC METABOLIC PANEL
Anion gap: 8 (ref 5–15)
BUN: 15 mg/dL (ref 6–20)
CO2: 28 mmol/L (ref 22–32)
CREATININE: 0.63 mg/dL (ref 0.44–1.00)
Calcium: 9.6 mg/dL (ref 8.9–10.3)
Chloride: 103 mmol/L (ref 101–111)
GFR calc Af Amer: >60 mL/min (ref >60)
GLUCOSE: 99 mg/dL (ref 65–99)
POTASSIUM: 3.8 mmol/L (ref 3.5–5.1)
Sodium: 139 mmol/L (ref 135–145)

## 2016-07-25 LAB — CBC
HCT: 41.2 % (ref 35.0–47.0)
Hemoglobin: 14.1 g/dL (ref 12.0–16.0)
MCH: 31.5 pg (ref 26.0–34.0)
MCHC: 34.3 g/dL (ref 32.0–36.0)
MCV: 92 fL (ref 80.0–100.0)
Platelets: 275 10*3/uL (ref 150–440)
RBC: 4.48 MIL/uL (ref 3.80–5.20)
RDW: 14 % (ref 11.5–14.5)
WBC: 8.6 10*3/uL (ref 3.6–11.0)

## 2016-07-25 LAB — PROTIME-INR
INR: 0.99
Prothrombin Time: 13.1 seconds (ref 11.4–15.2)

## 2016-07-25 LAB — HEPARIN LEVEL (UNFRACTIONATED): Heparin Unfractionated: 0.38 IU/mL (ref 0.30–0.70)

## 2016-07-25 LAB — APTT: aPTT: 30 seconds (ref 24–36)

## 2016-07-25 MED ORDER — SORBITOL 70 % SOLN
30.0000 mL | Freq: Every day | Status: DC | PRN
  Filled 2016-07-25: qty 30

## 2016-07-25 MED ORDER — HYDROCHLOROTHIAZIDE 25 MG PO TABS
25.0000 mg | ORAL_TABLET | Freq: Every day | ORAL | Status: DC
  Administered 2016-07-25: 25 mg via ORAL
  Filled 2016-07-25: qty 1

## 2016-07-25 MED ORDER — HEPARIN (PORCINE) IN NACL 100-0.45 UNIT/ML-% IJ SOLN
800.0000 [IU]/h | INTRAMUSCULAR | Status: AC
  Administered 2016-07-25 – 2016-07-26 (×2): 900 [IU]/h via INTRAVENOUS
  Filled 2016-07-25 (×2): qty 250

## 2016-07-25 MED ORDER — CLINDAMYCIN PHOSPHATE 300 MG/50ML IV SOLN
300.0000 mg | Freq: Once | INTRAVENOUS | Status: DC
  Filled 2016-07-25: qty 50

## 2016-07-25 MED ORDER — CARVEDILOL 25 MG PO TABS
25.0000 mg | ORAL_TABLET | Freq: Once | ORAL | Status: AC
  Administered 2016-07-25: 25 mg via ORAL
  Filled 2016-07-25: qty 1

## 2016-07-25 MED ORDER — AMLODIPINE BESYLATE 5 MG PO TABS
5.0000 mg | ORAL_TABLET | Freq: Once | ORAL | Status: AC
  Administered 2016-07-25: 5 mg via ORAL
  Filled 2016-07-25: qty 1

## 2016-07-25 MED ORDER — DOCUSATE SODIUM 100 MG PO CAPS
100.0000 mg | ORAL_CAPSULE | Freq: Every day | ORAL | Status: DC
  Administered 2016-07-27 – 2016-07-28 (×2): 100 mg via ORAL
  Filled 2016-07-25 (×2): qty 1

## 2016-07-25 MED ORDER — PNEUMOCOCCAL VAC POLYVALENT 25 MCG/0.5ML IJ INJ: 0.5000 mL | INJECTION | INTRAMUSCULAR | Status: DC

## 2016-07-25 MED ORDER — CLONIDINE HCL 0.1 MG PO TABS
0.2000 mg | ORAL_TABLET | ORAL | Status: DC | PRN
  Administered 2016-07-25: 0.2 mg via ORAL
  Filled 2016-07-25: qty 2

## 2016-07-25 MED ORDER — SODIUM CHLORIDE 0.9 % IV SOLN
INTRAVENOUS | Status: AC
  Administered 2016-07-25: 23:00:00 via INTRAVENOUS

## 2016-07-25 MED ORDER — MORPHINE SULFATE (PF) 4 MG/ML IV SOLN
4.0000 mg | Freq: Once | INTRAVENOUS | Status: AC
  Administered 2016-07-25: 4 mg via INTRAVENOUS
  Filled 2016-07-25: qty 1

## 2016-07-25 MED ORDER — MAGNESIUM CITRATE PO SOLN
1.0000 | Freq: Once | ORAL | Status: DC | PRN
  Filled 2016-07-25: qty 296

## 2016-07-25 MED ORDER — POLYETHYLENE GLYCOL 3350 17 G PO PACK: 17.0000 g | PACK | Freq: Every day | ORAL | Status: DC | PRN

## 2016-07-25 MED ORDER — COQ10 50 MG PO CAPS: 1.0000 | ORAL_CAPSULE | Freq: Every day | ORAL | Status: DC

## 2016-07-25 MED ORDER — AMLODIPINE BESYLATE 5 MG PO TABS: 5.0000 mg | ORAL_TABLET | Freq: Every day | ORAL | Status: DC

## 2016-07-25 MED ORDER — ONDANSETRON HCL 4 MG/2ML IJ SOLN: 4.0000 mg | Freq: Four times a day (QID) | INTRAMUSCULAR | Status: DC | PRN

## 2016-07-25 MED ORDER — PANTOPRAZOLE SODIUM 40 MG PO TBEC
40.0000 mg | DELAYED_RELEASE_TABLET | Freq: Every day | ORAL | Status: DC
  Administered 2016-07-27: 40 mg via ORAL
  Filled 2016-07-25 (×2): qty 1

## 2016-07-25 MED ORDER — ACETAMINOPHEN 500 MG PO TABS
1000.0000 mg | ORAL_TABLET | Freq: Once | ORAL | Status: AC
  Administered 2016-07-25: 1000 mg via ORAL
  Filled 2016-07-25: qty 2

## 2016-07-25 MED ORDER — MORPHINE SULFATE (PF) 2 MG/ML IV SOLN: 2.0000 mg | INTRAVENOUS | Status: DC | PRN

## 2016-07-25 MED ORDER — VITAMIN D 1000 UNITS PO TABS
2000.0000 [IU] | ORAL_TABLET | Freq: Every day | ORAL | Status: DC
  Administered 2016-07-27 – 2016-07-28 (×2): 2000 [IU] via ORAL
  Filled 2016-07-25 (×2): qty 2

## 2016-07-25 MED ORDER — ASPIRIN EC 81 MG PO TBEC
81.0000 mg | DELAYED_RELEASE_TABLET | Freq: Every day | ORAL | Status: DC
  Administered 2016-07-25 – 2016-07-28 (×3): 81 mg via ORAL
  Filled 2016-07-25 (×3): qty 1

## 2016-07-25 MED ORDER — ALUM & MAG HYDROXIDE-SIMETH 200-200-20 MG/5ML PO SUSP: 15.0000 mL | ORAL | Status: DC | PRN

## 2016-07-25 MED ORDER — SODIUM CHLORIDE 0.9 % IV SOLN: 500.0000 mL | Freq: Once | INTRAVENOUS | Status: DC | PRN

## 2016-07-25 MED ORDER — CARVEDILOL 12.5 MG PO TABS
25.0000 mg | ORAL_TABLET | Freq: Two times a day (BID) | ORAL | Status: DC
  Administered 2016-07-25 – 2016-07-28 (×6): 25 mg via ORAL
  Filled 2016-07-25 (×6): qty 2

## 2016-07-25 MED ORDER — HYDROCODONE-ACETAMINOPHEN 5-325 MG PO TABS
1.0000 | ORAL_TABLET | Freq: Four times a day (QID) | ORAL | Status: DC | PRN
Start: 2016-07-25 — End: 2016-07-28
  Administered 2016-07-27: 1 via ORAL
  Filled 2016-07-25: qty 1

## 2016-07-25 NOTE — Progress Notes (Signed)
PHARMACIST - PHYSICIAN ORDER COMMUNICATION  CONCERNING: P&T Medication Policy on Herbal Medications  DESCRIPTION:  This patient's order for:  CoQ10 Caps   has been noted.  This product(s) is classified as an "herbal" or natural product. Due to a lack of definitive safety studies or FDA approval, nonstandard manufacturing practices, plus the potential risk of unknown drug-drug interactions while on inpatient medications, the Pharmacy and Therapeutics Committee does not permit the use of "herbal" or natural products of this type within [facility].   ACTION TAKEN: The pharmacy department is unable to verify this order at this time and your patient has been informed of this safety policy. Please reevaluate patient's clinical condition at discharge and address if the herbal or natural product(s) should be resumed at that time.  Gardner CandleSheema M Braylei Totino, PharmD, BCPS Clinical Pharmacist 07/25/2016 5:38 PM   ;

## 2016-07-25 NOTE — Telephone Encounter (Signed)
Patient's daughter is calling stating that her Mom is in tremendous pain in her calf muscle area, wants to know if her Mom should be taken to the hospital or come here for an ultrasound and be seen??  [phone] [name](Lori)

## 2016-07-25 NOTE — ED Notes (Signed)
Heparin held at this time due to hypertension and patient exhibiting some confusion. Dr. Don PerkingVeronese aware and at bedside.

## 2016-07-25 NOTE — H&P (Signed)
[facility] Admission History & Physical  MRN : [id number]  Ashley Hill is a 82 y.o. (01-12-1935) female who presents with chief complaint of  Chief Complaint  Patient presents with  . Leg Pain  .  History of Present Illness: Patient Presents to the emergency room today with complaints of increasing pain in her right lower extremity.  She notes the pain is been in the posterior calf for several days but now it appears to progressed into the toes. She also notes that her right great toe became very cyanotic, "blue".  She has undergone  bilateral lower extremity revascularization procedures including multiple procedures on the right leg.  The most recent of which was performed in October 2017. She has now progressed to rest pain but no ulceration.   Current Facility-Administered Medications  Medication Dose Route Frequency Provider Last Rate Last Dose  . amLODipine (NORVASC) tablet 5 mg  5 mg Oral Daily Renford Dills, MD      . aspirin EC tablet 81 mg  81 mg Oral Daily Renford Dills, MD      . carvedilol (COREG) tablet 25 mg  25 mg Oral BID WC Renford Dills, MD      . cholecalciferol (VITAMIN D) tablet 2,000 Units  2,000 Units Oral Daily Renford Dills, MD      . Melene Muller ON 07/26/2016] clindamycin (CLEOCIN) IVPB 300 mg  300 mg Intravenous Once Renford Dills, MD      . CoQ10 CAPS 1 capsule  1 capsule Oral Daily Renford Dills, MD      . heparin ADULT infusion 100 units/mL (25000 units/262mL sodium chloride 0.45%)  900 Units/hr Intravenous Continuous Sheema M Hallaji, RPH 9 mL/hr at 07/25/16 1524 900 Units/hr at 07/25/16 1524  . HYDROcodone-acetaminophen (NORCO/VICODIN) 5-325 MG per tablet 1 tablet  1 tablet Oral Q6H PRN Renford Dills, MD       Current Outpatient Prescriptions  Medication Sig Dispense Refill  . amLODipine (NORVASC) 5 MG tablet Take 5 mg by mouth daily.    Marland Kitchen apixaban (ELIQUIS) 5 MG TABS tablet Take 1 tablet (5 mg total) by mouth  2 (two) times daily. 60 tablet 2  . aspirin 81 MG tablet Take 81 mg by mouth daily.    Marland Kitchen b complex vitamins tablet Take 1 tablet by mouth daily.    Marland Kitchen CALCIUM PO Take 750 mg by mouth 2 (two) times daily.    . carvedilol (COREG) 25 MG tablet Take 25 mg by mouth 2 (two) times daily with a meal.    . cholecalciferol (VITAMIN D) 1000 units tablet Take 2,000 Units by mouth daily.    . Coenzyme Q10 (COQ10) 50 MG CAPS Take 1 capsule by mouth daily.    . CRESTOR 5 MG tablet TAKE 1 TABLET BY MOUTH EVERY DAY 30 tablet 6  . hydrochlorothiazide (HYDRODIURIL) 25 MG tablet TAKE 1 TABLET BY MOUTH EVERY DAY 30 tablet 3  . Turmeric 450 MG CAPS Take 450 mg by mouth daily.    Marland Kitchen HYDROcodone-acetaminophen (NORCO) 5-325 MG tablet Take 1 tablet by mouth every 6 (six) hours as needed for moderate pain. (Patient not taking: Reported on 07/21/2016) 30 tablet 0    Past Medical History:  Diagnosis Date  . Dyslipidemia   . Ectopic atrial tachycardia (HCC)   . Environmental allergies   . H/O scarlet fever   . Heart murmur   . HOH (hard of hearing)   . Hyperlipidemia   .  Hypertension   . Peripheral vascular disease (HCC)   . 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 Harvie Bridge, [place].  . Transient cerebral ischemia     Past Surgical History:  Procedure Laterality Date  . ABLATION OF DYSRHYTHMIC FOCUS    . AUGMENTATION MAMMAPLASTY Bilateral   . JOINT REPLACEMENT     knee  . KNEE ARTHROSCOPY W/ MENISCAL REPAIR     Right   . PERIPHERAL VASCULAR CATHETERIZATION Right 01/11/2015   Procedure: Lower Extremity Angiography;  Surgeon: Annice Needy, MD;  Location: [facility];  Service: Cardiovascular;  Laterality: Right;  . PERIPHERAL VASCULAR CATHETERIZATION Right 06/21/2015   Procedure: Lower Extremity Angiography;  Surgeon: Annice Needy, MD;  Location: [facility];  Service: Cardiovascular;  Laterality: Right;  . PERIPHERAL VASCULAR  CATHETERIZATION Right 06/21/2015   Procedure: Lower Extremity Intervention;  Surgeon: Annice Needy, MD;  Location: [facility];  Service: Cardiovascular;  Laterality: Right;  . PERIPHERAL VASCULAR CATHETERIZATION Right 10/12/2015   Procedure: Lower Extremity Angiography;  Surgeon: Annice Needy, MD;  Location: [facility];  Service: Cardiovascular;  Laterality: Right;  . PERIPHERAL VASCULAR CATHETERIZATION Right 10/12/2015   Procedure: Lower Extremity Intervention;  Surgeon: Annice Needy, MD;  Location: [facility];  Service: Cardiovascular;  Laterality: Right;  . PERIPHERAL VASCULAR CATHETERIZATION Left 02/10/2016   Procedure: Lower Extremity Angiography;  Surgeon: Annice Needy, MD;  Location: [facility];  Service: Cardiovascular;  Laterality: Left;  . PERIPHERAL VASCULAR CATHETERIZATION  02/10/2016   Procedure: Lower Extremity Intervention;  Surgeon: Annice Needy, MD;  Location: [facility];  Service: Cardiovascular;;  . PERIPHERAL VASCULAR CATHETERIZATION Right 02/24/2016   Procedure: Lower Extremity Angiography;  Surgeon: Annice Needy, MD;  Location: [facility];  Service: Cardiovascular;  Laterality: Right;  . SHOULDER CLOSED REDUCTION Right 04/10/2016   Procedure: CLOSED REDUCTION SHOULDER;  Surgeon: Kennedy Bucker, MD;  Location: [facility];  Service: Orthopedics;  Laterality: Right;  . TONSILLECTOMY    . TOTAL KNEE ARTHROPLASTY  2015  . VAGINAL HYSTERECTOMY      Social History Social History  Substance Use Topics  . Smoking status: Former Smoker    Packs/day: 1.00    Years: 10.00    Types: Cigarettes    Quit date: 02/23/1981  . Smokeless tobacco: Never Used  . Alcohol use No    Family History Family History  Problem Relation Age of Onset  . Alcohol abuse Father   . Cancer Father   . Diabetes Paternal Grandfather   . Breast cancer Neg Hx   No family history of bleeding/clotting disorders, porphyria or autoimmune disease   Allergies   Allergen Reactions  . Penicillins     Has patient had a PCN reaction causing immediate rash, facial/tongue/throat swelling, SOB or lightheadedness with hypotension: Yes Has patient had a PCN reaction causing severe rash involving mucus membranes or skin necrosis: No Has patient had a PCN reaction that required hospitalization No Has patient had a PCN reaction occurring within the last 10 years: No If all of the above answers are "NO", then may proceed with Cephalosporin use.   . Statins   . Lipitor [Atorvastatin]      REVIEW OF SYSTEMS (Negative unless checked)  Constitutional: [] Weight loss  [] Fever  [] Chills Cardiac: [] Chest pain   [] Chest pressure   [] Palpitations   [] Shortness of breath  when laying flat   [] Shortness of breath at rest   [] Shortness of breath with exertion. Vascular:  [] Pain in legs with walking   [] Pain in legs at rest   [] Pain in legs when laying flat   [] Claudication   [] Pain in feet when walking  [] Pain in feet at rest  [] Pain in feet when laying flat   [] History of DVT   [] Phlebitis   [] Swelling in legs   [] Varicose veins   [] Non-healing ulcers Pulmonary:   [] Uses home oxygen   [] Productive cough   [] Hemoptysis   [] Wheeze  [] COPD   [] Asthma Neurologic:  [] Dizziness  [] Blackouts   [] Seizures   [] History of stroke   [] History of TIA  [] Aphasia   [] Temporary blindness   [] Dysphagia   [] Weakness or numbness in arms   [] Weakness or numbness in legs Musculoskeletal:  [] Arthritis   [] Joint swelling   [] Joint pain   [] Low back pain Hematologic:  [] Easy bruising  [] Easy bleeding   [] Hypercoagulable state   [] Anemic  [] Hepatitis Gastrointestinal:  [] Blood in stool   [] Vomiting blood  [] Gastroesophageal reflux/heartburn   [] Difficulty swallowing. Genitourinary:  [] Chronic kidney disease   [] Difficult urination  [] Frequent urination  [] Burning with urination   [] Blood in urine Skin:  [] Rashes   [] Ulcers   [] Wounds Psychological:  [] History of anxiety   []  History of major  depression.  Physical Examination  Vitals:   07/25/16 1525 07/25/16 1530 07/25/16 1600 07/25/16 1630  BP: (!) 219/96 (!) 216/86 (!) 195/94 (!) 180/90  Pulse: 85 76 89   Resp: 18 16 15 20   Temp:      TempSrc:      SpO2: 98% 96% 96%   Weight:       Body mass index is 23.76 kg/m. Gen: WD/WN, NAD Head: [place]/AT, No temporalis wasting.  Ear/Nose/Throat: Hearing grossly intact, nares w/o erythema or drainage, oropharynx w/o Erythema/Exudate, Eyes: Sclera non-icteric, conjunctiva clear Neck: Supple, no nuchal rigidity.  No JVD.  Pulmonary:  Good air movement, no increased work of respiration or use of accessory muscles  Cardiac: RRR, normal S1, S2, no Murmurs, rubs or gallops. Vascular: Right foot is pale and cool to the touch with sluggish capillary refill. Motor and sensory is intact. Diffuse small varicose veins are noted bilaterally no palpable cords or other areas consistent with phlebitis are noted. Vessel Right Left  Radial Palpable Palpable  Ulnar Palpable Palpable  Brachial Palpable Palpable  Carotid Palpable, without bruit Palpable, without bruit  Aorta Not palpable N/A  Femoral Palpable Palpable  Popliteal Not Palpable Not Palpable  PT Not Palpable Not Palpable  DP Not Palpable Not Palpable   Gastrointestinal: soft, non-tender/non-distended. No guarding/reflex. No masses, surgical incisions, or scars. Musculoskeletal: M/S 5/5 throughout.  No deformity or atrophy.  No edema Neurologic: Sensation grossly intact in extremities.  Symmetrical.  Speech is fluent. Motor exam as listed above. Psychiatric: Judgment intact, Mood & affect appropriate for pt's clinical situation. Dermatologic: No rashes or ulcers noted.  No cellulitis or open wounds. Lymph : No Cervical, Axillary, or Inguinal lymphadenopathy.      CBC Lab Results  Component Value Date   WBC 8.6 07/25/2016   HGB 14.1 07/25/2016   HCT 41.2 07/25/2016   MCV 92.0 07/25/2016   PLT 275 07/25/2016    BMET     Component Value Date/Time   NA 139 07/25/2016 1328   NA 142 04/29/2013 1023   K 3.8 07/25/2016 1328   CL 103 07/25/2016 1328  CO2 28 07/25/2016 1328   GLUCOSE 99 07/25/2016 1328   BUN 15 07/25/2016 1328   BUN 22 04/29/2013 1023   CREATININE 0.63 07/25/2016 1328   CALCIUM 9.6 07/25/2016 1328   GFRNONAA >60 07/25/2016 1328   GFRAA >60 07/25/2016 1328   Estimated Creatinine Clearance: 52.4 mL/min (by C-G formula based on SCr of 0.63 mg/dL).  COAG Lab Results  Component Value Date   INR 0.99 07/25/2016   INR 1.0 12/09/2013    Radiology Ct Head Wo Contrast  Result Date: 07/25/2016 CLINICAL DATA:  Increase contusion. EXAM: CT HEAD WITHOUT CONTRAST TECHNIQUE: Contiguous axial images were obtained from the base of the skull through the vertex without intravenous contrast. COMPARISON:  01/21/2014 FINDINGS: Brain: Mild atrophy and white matter changes are present bilaterally. Acute infarct, hemorrhage, or mass lesion is present. Basal ganglia are intact. The insular ribbon is normal bilaterally. The brainstem and cerebellum are normal. Vascular: Atherosclerotic calcifications are present. There is no hyperdense vessel. Skull: The calvarium is intact. The left parietal lucency is stable. No focal lytic or blastic lesions are evident. Sinuses/Orbits: The paranasal sinuses and mastoid air cells are clear. Orbital calcifications are stable. The globes and orbits are otherwise unremarkable. IMPRESSION: 1. No acute intracranial abnormality or significant interval change. 2. Stable mild atrophy and white matter disease. Electronically Signed   By: Marin Robertshristopher  Mattern M.D.   On: 07/25/2016 14:54      Assessment/Plan 1. Ischemic right lower extremity: Patient is currently taking L Quist and she is unreliable as to whether she is taking her medications or not. Therefore given the fact that she has intact motor and sensory and her pain is being well managed with medications I will place her on a  heparin drip and proceed with angiography tomorrow. The risks and benefits were reviewed with the patient and her daughter all questions were answered and they agree with this plan 2. Dementia: Patient will be given supportive care and frequent orientation. 3. Hypertension: Her home antihypertensives have been reordered and she has additional orders for when necessary should her systolic pressures exceeding 160 mmHg. 4.  Hyperlipidemia: Her statin will be continued as an outpatient   Levora DredgeGregory Schnier, MD  07/25/2016 5:35 PM

## 2016-07-25 NOTE — ED Notes (Signed)
Patient was out of bed and dressed upon this writer's arrival to the room. Patient states she was going home and when asked about her IV, stated that her neighbor could take it out. Patient states her daughter was not going to be able to come to the hospital today, so the patient was going home and will follow up with Dr. Wyn Quakerew tomorrow. Dr. Don PerkingVeronese aware.

## 2016-07-25 NOTE — Telephone Encounter (Signed)
Called patient back to setup the ultrasound and follow up visit with Selena BattenKim. Patient agreed to time and day of ultrasound.

## 2016-07-25 NOTE — ED Provider Notes (Addendum)
[facility] Emergency Department Provider Note  ____________________________________________  Time seen: Approximately 1:22 PM  I have reviewed the triage vital signs and the nursing notes.   HISTORY  Chief Complaint Leg Pain   HPI Ashley Hill is a 82 y.o. female with h/o PAD s/p multiple angioplasties and stent placement to the distal right SFA and popliteal artery in 02/2016 who presents for evaluation of RLE pain and cyanosis. Symptoms started last night. Patient endorses sore like pain in her R calf that is constant, and worse when standing up, not radiating, improved when laying flat or elevating leg. Moderate intensity at this time. Also noted cyanosis of her R big toe and pale discoloration of her foot which feels colder when compared to the her left. She endorses compliance with her Eliquis   Past Medical History:  Diagnosis Date  . Dyslipidemia   . Ectopic atrial tachycardia (HCC)   . Environmental allergies   . H/O scarlet fever   . Heart murmur   . HOH (hard of hearing)   . Hyperlipidemia   . Hypertension   . Peripheral vascular disease (HCC)   . 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 Har[place]Sagi N. [place].  . Transient cerebral ischemia     Patient Active Problem List   Diagnosis Date Noted  . Pain in limb 07/21/2016  . Closed fracture dislocation of right shoulder joint, initial encounter 04/10/2016  . Intermittent claudication (HCC) 06/30/2015  . Polymyalgia rheumatica (HCC) 10/27/2014  . Carotid artery narrowing 10/05/2014  . Atrioventricular nodal re-entry tachycardia (HCC) 10/05/2014  . Lesion of right parietal bone 01/27/2014  . Left sided numbness 01/05/2014  . Medicare annual wellness visit, initial 12/09/2013  . Bleeding 12/09/2013  . Dizziness and giddiness 12/09/2013  . Preop cardiovascular exam 04/21/2013  . Arthritis, degenerative 10/23/2012  . Chest  pain 07/22/2012  . SVT (supraventricular tachycardia) (HCC)   . Hypertension   . Hyperlipidemia     Past Surgical History:  Procedure Laterality Date  . ABLATION OF DYSRHYTHMIC FOCUS    . AUGMENTATION MAMMAPLASTY Bilateral   . JOINT REPLACEMENT     knee  . KNEE ARTHROSCOPY W/ MENISCAL REPAIR     Right   . PERIPHERAL VASCULAR CATHETERIZATION Right 01/11/2015   Procedure: Lower Extremity Angiography;  Surgeon: Annice NeedyJason S Dew, MD;  Location: [facility] INVASIVE CV LAB;  Service: Cardiovascular;  Laterality: Right;  . PERIPHERAL VASCULAR CATHETERIZATION Right 06/21/2015   Procedure: Lower Extremity Angiography;  Surgeon: Annice NeedyJason S Dew, MD;  Location: [facility] INVASIVE CV LAB;  Service: Cardiovascular;  Laterality: Right;  . PERIPHERAL VASCULAR CATHETERIZATION Right 06/21/2015   Procedure: Lower Extremity Intervention;  Surgeon: Annice NeedyJason S Dew, MD;  Location: [facility] INVASIVE CV LAB;  Service: Cardiovascular;  Laterality: Right;  . PERIPHERAL VASCULAR CATHETERIZATION Right 10/12/2015   Procedure: Lower Extremity Angiography;  Surgeon: Annice NeedyJason S Dew, MD;  Location: [facility] INVASIVE CV LAB;  Service: Cardiovascular;  Laterality: Right;  . PERIPHERAL VASCULAR CATHETERIZATION Right 10/12/2015   Procedure: Lower Extremity Intervention;  Surgeon: Annice NeedyJason S Dew, MD;  Location: [facility] INVASIVE CV [facility];  Service: Cardiovascular;  Laterality: Right;  . PERIPHERAL VASCULAR CATHETERIZATION Left 02/10/2016   Procedure: Lower Extremity Angiography;  Surgeon: Annice NeedyJason S Dew, MD;  Location: [facility] INVASIVE CV [facility];  Service: Cardiovascular;  Laterality: Left;  . PERIPHERAL VASCULAR CATHETERIZATION  02/10/2016   Procedure: Lower Extremity Intervention;  Surgeon: Annice NeedyJason S Dew, MD;  [facility];  Service: Cardiovascular;;  . PERIPHERAL VASCULAR CATHETERIZATION Right 02/24/2016   Procedure: Lower Extremity Angiography;  Surgeon: Annice Needy, MD;  Location: [facility] INVASIVE CV LAB;  Service: Cardiovascular;  Laterality: Right;  . SHOULDER  CLOSED REDUCTION Right 04/10/2016   Procedure: CLOSED REDUCTION SHOULDER;  Surgeon: Kennedy Bucker, MD;  Location: [facility] ORS;  Service: Orthopedics;  Laterality: Right;  . TONSILLECTOMY    . TOTAL KNEE ARTHROPLASTY  2015  . VAGINAL HYSTERECTOMY      Prior to Admission medications   Medication Sig Start Date End Date Taking? Authorizing Provider  amLODipine (NORVASC) 5 MG tablet Take 5 mg by mouth daily.   Yes Historical Provider, MD  apixaban (ELIQUIS) 5 MG TABS tablet Take 1 tablet (5 mg total) by mouth 2 (two) times daily. 10/12/15  Yes Annice Needy, MD  aspirin 81 MG tablet Take 81 mg by mouth daily.   Yes Historical Provider, MD  b complex vitamins tablet Take 1 tablet by mouth daily.   Yes Historical Provider, MD  CALCIUM PO Take 750 mg by mouth 2 (two) times daily.   Yes Historical Provider, MD  carvedilol (COREG) 25 MG tablet Take 25 mg by mouth 2 (two) times daily with a meal.   Yes Historical Provider, MD  cholecalciferol (VITAMIN D) 1000 units tablet Take 2,000 Units by mouth daily.   Yes Historical Provider, MD  Coenzyme Q10 (COQ10) 50 MG CAPS Take 1 capsule by mouth daily.   Yes Historical Provider, MD  CRESTOR 5 MG tablet TAKE 1 TABLET BY MOUTH EVERY DAY 04/14/14  Yes Iran Ouch, MD  hydrochlorothiazide (HYDRODIURIL) 25 MG tablet TAKE 1 TABLET BY MOUTH EVERY DAY 05/03/15  Yes Iran Ouch, MD  Turmeric 450 MG CAPS Take 450 mg by mouth daily.   Yes Historical Provider, MD  HYDROcodone-acetaminophen (NORCO) 5-325 MG tablet Take 1 tablet by mouth every 6 (six) hours as needed for moderate pain. Patient not taking: Reported on 07/21/2016 04/10/16   Kennedy Bucker, MD  oxyCODONE-acetaminophen (PERCOCET) 5-325 MG tablet Take 2 tablets by mouth every 6 (six) hours as needed for moderate pain or severe pain. Patient not taking: Reported on 04/10/2016 06/22/15   Emily Filbert, MD  traMADol (ULTRAM) 50 MG tablet Take 1 tablet (50 mg total) by mouth every 6 (six) hours as  needed. Patient not taking: Reported on 04/10/2016 02/24/16   Annice Needy, MD    Allergies Penicillins; Statins; and Lipitor [atorvastatin]  Family History  Problem Relation Age of Onset  . Alcohol abuse Father   . Cancer Father   . Diabetes Paternal Grandfather   . Breast cancer Neg Hx     Social History Social History  Substance Use Topics  . Smoking status: Former Smoker    Packs/day: 1.00    Years: 10.00    Types: Cigarettes    Quit date: 02/23/1981  . Smokeless tobacco: Never Used  . Alcohol use No    Review of Systems  Constitutional: Negative for fever. Eyes: Negative for visual changes. ENT: Negative for sore throat. Neck: No neck pain  Cardiovascular: Negative for chest pain. Respiratory: Negative for shortness of breath. Gastrointestinal: Negative for abdominal pain, vomiting or diarrhea. Genitourinary: Negative for dysuria. Musculoskeletal: Negative for back pain. + RLE pain, cyanosis Skin: Negative for rash. Neurological: Negative for headaches, weakness or numbness. Psych: No SI or HI  ____________________________________________   PHYSICAL EXAM:  VITAL SIGNS: ED Triage Vitals  Enc Vitals Group  BP 07/25/16 1243 111/78     Pulse Rate 07/25/16 1243 (!) 105     Resp 07/25/16 1243 20     Temp 07/25/16 1243 98.2 F (36.8 C)     Temp Source 07/25/16 1243 Oral     SpO2 07/25/16 1243 100 %     Weight 07/25/16 1246 145 lb (65.8 kg)     Height --      Head Circumference --      Peak Flow --      Pain Score 07/25/16 1246 9     Pain Loc --      Pain Edu? --      Excl. in GC? --     Constitutional: Alert and oriented. Well appearing and in no apparent distress. HEENT:      Head: Normocephalic and atraumatic.         Eyes: Conjunctivae are normal. Sclera is non-icteric. EOMI. PERRL      Mouth/Throat: Mucous membranes are moist.       Neck: Supple with no signs of meningismus. Cardiovascular: Regular rate and rhythm. No murmurs, gallops, or  rubs. 2+ symmetrical distal pulses are present in all extremities. No JVD. Respiratory: Normal respiratory effort. Lungs are clear to auscultation bilaterally. No wheezes, crackles, or rhonchi.  Gastrointestinal: Soft, non tender, and non distended with positive bowel sounds. No rebound or guarding. Musculoskeletal: R foot is colder than left and pale toes are noted. No palpable or dopplerable PT and DP pulses are present on the right. When patient stands R foot turns red when compared to left.  Neurologic: Normal speech and language. Face is symmetric. Moving all extremities. No gross focal neurologic deficits are appreciated. Skin: Skin is warm, dry and intact. No rash noted. Psychiatric: Mood and affect are normal. Speech and behavior are normal.  ____________________________________________   LABS (all labs ordered are listed, but only abnormal results are displayed)  Labs Reviewed  HEPARIN LEVEL (UNFRACTIONATED) - Abnormal; Notable for the following:       Result Value   Heparin Unfractionated <0.10 (*)    All other components within normal limits  CBC  BASIC METABOLIC PANEL  PROTIME-INR  APTT  HEPARIN LEVEL (UNFRACTIONATED)   ____________________________________________  EKG  none ____________________________________________  RADIOLOGY  none  ____________________________________________   PROCEDURES  Procedure(s) performed: None Procedures Critical Care performed:  Yes  CRITICAL CARE Performed by: Nita Sickle  ?  Total critical care time: 30 min  Critical care time was exclusive of separately billable procedures and treating other patients.  Critical care was necessary to treat or prevent imminent or life-threatening deterioration.  Critical care was time spent personally by me on the following activities: development of treatment plan with patient and/or surrogate as well as nursing, discussions with consultants, evaluation of patient's response to  treatment, examination of patient, obtaining history from patient or surrogate, ordering and performing treatments and interventions, ordering and review of laboratory studies, ordering and review of radiographic studies, pulse oximetry and re-evaluation of patient's condition.  ____________________________________________   INITIAL IMPRESSION / ASSESSMENT AND PLAN / ED COURSE  82 y.o. female with h/o PAD s/p multiple angioplasties and stent placement to the distal right SFA and popliteal artery in 02/2016 who presents for evaluation of RLE pain and cyanosis since yesterday evening. Patient with absent DP and PT pulses on the right foot with pale discoloration of the toes which resolved the patient stands up. I discussed these findings with Dr. Gilda Crease, vascular on call who recommended  starting patient heparin drip and admitted patient to their service for an angiogram. Patient has been updated of these results. Pharmacy has been consulted for heparin dosage.   Clinical Course as of Jul 25 1528  Tue Jul 25, 2016  1436 Patient had a moment of confusion, stood up and got dressed one to go home. Her daughter was on the phone and wants patient to stay. I was able to convince her to stay. Her blood pressures now elevated to 215/91. Her mental status is clearing. Head CT pending. Holding heparin until CT is back. Patient has not taken home antihypertensives, will give those after CT head. She is complaining of severe pain in her RLE. Will give mrphine and tylenol. Exam of the leg is unchanged.   [CV]    Clinical Course User Index [CV] Nita Sickle, MD    Pertinent labs & imaging results that were available during my care of the patient were reviewed by me and considered in my medical decision making (see chart for details).    ____________________________________________   FINAL CLINICAL IMPRESSION(S) / ED DIAGNOSES  Final diagnoses:  Peripheral artery occlusion (HCC)  Claudication in  peripheral vascular disease (HCC)      NEW MEDICATIONS STARTED DURING THIS VISIT:  New Prescriptions   No medications on file     Note:  This document was prepared using Dragon voice recognition software and may include unintentional dictation errors.    Nita Sickle, MD 07/25/16 1336    Nita Sickle, MD 07/25/16 [phone]

## 2016-07-25 NOTE — ED Notes (Signed)
Patient back from CT scan. Patient's hearing aids were given back to her to put on.

## 2016-07-25 NOTE — Telephone Encounter (Signed)
I saw her last week and she had reasonably mild pain at that time and had a few varicose veins.  She has a long history of PAD and that is the primary concern.  If we can get an ultrasound in the next couple of days, coming here is fine.  If not or if the pain is that bad, she can go to the ER I guess.

## 2016-07-25 NOTE — ED Notes (Signed)
Patient taken to CT scan. Patient removed bilateral hearing aids prior to leaving the room. The hearing aids were taken out by the patient and  placed in a specimen cup and this writer placed them in the the patient's purse.

## 2016-07-25 NOTE — ED Triage Notes (Addendum)
Pt to ed with c/o right lower leg pain.  Pt states her right foot has been turning blue and she was seen at Dr. Driscilla Grammesew's office last week for same but states he said it was fine.  Pt states pain continues and right foot turn blue intermittently.  Pt foot with good color and warm to touch then suddenly blue noted to right foot first toe, and toe was cold to touch.  Pt also reports toes are numb to touch.

## 2016-07-25 NOTE — Progress Notes (Addendum)
ANTICOAGULATION CONSULT NOTE - Initial Consult  Pharmacy Consult for heparin drip  Indication: Peripheral artery occlusion   Allergies  Allergen Reactions  . Penicillins     Has patient had a PCN reaction causing immediate rash, facial/tongue/throat swelling, SOB or lightheadedness with hypotension: Yes Has patient had a PCN reaction causing severe rash involving mucus membranes or skin necrosis: No Has patient had a PCN reaction that required hospitalization No Has patient had a PCN reaction occurring within the last 10 years: No If all of the above answers are "NO", then may proceed with Cephalosporin use.   . Statins   . Lipitor [Atorvastatin]     Patient Measurements: Weight: 145 lb (65.8 kg)  Vital Signs: Temp: 98.2 F (36.8 C) (03/13 1243) Temp Source: Oral (03/13 1243) BP: 111/78 (03/13 1243) Pulse Rate: 105 (03/13 1243)  Labs: No results for input(s): HGB, HCT, PLT, APTT, LABPROT, INR, HEPARINUNFRC, HEPRLOWMOCWT, CREATININE, CKTOTAL, CKMB, TROPONINI in the last 72 hours.  CrCl cannot be calculated (Patient's most recent lab result is older than the maximum 21 days allowed.).   Medical History: Past Medical History:  Diagnosis Date  . Dyslipidemia   . Ectopic atrial tachycardia (HCC)   . Environmental allergies   . H/O scarlet fever   . Heart murmur   . HOH (hard of hearing)   . Hyperlipidemia   . Hypertension   . Peripheral vascular disease (HCC)   . 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 Harvie BridgeSagi N. [place], [place].  . Transient cerebral ischemia     Assessment: 82 yo female with claudication in peripheral vascular disease and peripheral artery occlusion. Pharmacy consulted for heparin dosing and monitoring.   Patient dose take apixaban 5mg  BID at home- last dose was yesterday evening (3/12) per patient.   Goal of Therapy:  Heparin level 0.3-0.7 units/ml aPTT 66-102 seconds Monitor platelets  by anticoagulation protocol: Yes   Plan:  Baseline labs ordered. Baseline heparin level <0.10.  Will need to adjust heparin dose using HL since baseline HL was not elevated.  Start heparin infusion at 900 units/hr Check anti-Xa  level in 8 hours and daily while on heparin Continue to monitor H&H and platelets  Gardner CandleSheema M Taler Kushner, PharmD, BCPS Clinical Pharmacist 07/25/2016 1:45 PM

## 2016-07-26 ENCOUNTER — Encounter
Admission: EM | Disposition: A | Discharge status: Home / Self Care | Payer: Self-pay | Source: Home / Self Care | Attending: Vascular Surgery

## 2016-07-26 ENCOUNTER — Ambulatory Visit (INDEPENDENT_AMBULATORY_CARE_PROVIDER_SITE_OTHER): Payer: Medicare Other | Admitting: Vascular Surgery

## 2016-07-26 ENCOUNTER — Encounter (INDEPENDENT_AMBULATORY_CARE_PROVIDER_SITE_OTHER): Payer: Medicare Other

## 2016-07-26 DIAGNOSIS — F0280 Dementia in other diseases classified elsewhere without behavioral disturbance: Secondary | ICD-10-CM

## 2016-07-26 DIAGNOSIS — F1010 Alcohol abuse, uncomplicated: Secondary | ICD-10-CM

## 2016-07-26 DIAGNOSIS — I70221 Atherosclerosis of native arteries of extremities with rest pain, right leg: Secondary | ICD-10-CM

## 2016-07-26 DIAGNOSIS — G309 Alzheimer's disease, unspecified: Secondary | ICD-10-CM

## 2016-07-26 HISTORY — PX: PERIPHERAL VASCULAR BALLOON ANGIOPLASTY: CATH118281

## 2016-07-26 LAB — CBC
HCT: 38.3 % (ref 35.0–47.0)
Hemoglobin: 13.3 g/dL (ref 12.0–16.0)
MCH: 32 pg (ref 26.0–34.0)
MCHC: 34.6 g/dL (ref 32.0–36.0)
MCV: 92.5 fL (ref 80.0–100.0)
PLATELETS: 236 10*3/uL (ref 150–440)
RBC: 4.14 MIL/uL (ref 3.80–5.20)
RDW: 13.8 % (ref 11.5–14.5)
WBC: 6.1 10*3/uL (ref 3.6–11.0)

## 2016-07-26 LAB — BASIC METABOLIC PANEL
Anion gap: 7 (ref 5–15)
BUN: 18 mg/dL (ref 6–20)
CALCIUM: 9.3 mg/dL (ref 8.9–10.3)
CO2: 30 mmol/L (ref 22–32)
Chloride: 102 mmol/L (ref 101–111)
Creatinine, Ser: 0.56 mg/dL (ref 0.44–1.00)
GFR calc non Af Amer: >60 mL/min (ref >60)
Glucose, Bld: 106 mg/dL — ABNORMAL HIGH (ref 65–99)
POTASSIUM: 4 mmol/L (ref 3.5–5.1)
Sodium: 139 mmol/L (ref 135–145)

## 2016-07-26 LAB — HEPARIN LEVEL (UNFRACTIONATED): Heparin Unfractionated: 0.54 IU/mL (ref 0.30–0.70)

## 2016-07-26 SURGERY — PERIPHERAL VASCULAR BALLOON ANGIOPLASTY
Anesthesia: Moderate Sedation

## 2016-07-26 SURGERY — PERIPHERAL VASCULAR BALLOON ANGIOPLASTY
Anesthesia: Moderate Sedation | Laterality: Right

## 2016-07-26 MED ORDER — FENTANYL CITRATE (PF) 100 MCG/2ML IJ SOLN
INTRAMUSCULAR | Status: AC
Start: 2016-07-26 — End: 2016-07-26
  Filled 2016-07-26: qty 2

## 2016-07-26 MED ORDER — VITAMIN B-1 100 MG PO TABS: 100.0000 mg | ORAL_TABLET | Freq: Every day | ORAL | Status: DC

## 2016-07-26 MED ORDER — CLINDAMYCIN PHOSPHATE 300 MG/50ML IV SOLN
INTRAVENOUS | Status: AC
  Administered 2016-07-26: 300 mg
  Filled 2016-07-26: qty 50

## 2016-07-26 MED ORDER — LORAZEPAM 1 MG PO TABS
1.0000 mg | ORAL_TABLET | Freq: Four times a day (QID) | ORAL | Status: DC | PRN
  Administered 2016-07-26: 1 mg via ORAL
  Filled 2016-07-26: qty 1

## 2016-07-26 MED ORDER — ADULT MULTIVITAMIN W/MINERALS CH
1.0000 | ORAL_TABLET | Freq: Every day | ORAL | Status: DC
  Administered 2016-07-27 – 2016-07-28 (×2): 1 via ORAL
  Filled 2016-07-26 (×2): qty 1

## 2016-07-26 MED ORDER — MIDAZOLAM HCL 2 MG/2ML IJ SOLN
INTRAMUSCULAR | Status: DC | PRN
Start: 2016-07-26 — End: 2016-07-26
  Administered 2016-07-26: 2 mg via INTRAVENOUS
  Administered 2016-07-26: 1 mg

## 2016-07-26 MED ORDER — HEPARIN SODIUM (PORCINE) 1000 UNIT/ML IJ SOLN
INTRAMUSCULAR | Status: AC
  Filled 2016-07-26: qty 1

## 2016-07-26 MED ORDER — FENTANYL CITRATE (PF) 100 MCG/2ML IJ SOLN
INTRAMUSCULAR | Status: AC
  Filled 2016-07-26: qty 2

## 2016-07-26 MED ORDER — FOLIC ACID 1 MG PO TABS
1.0000 mg | ORAL_TABLET | Freq: Every day | ORAL | Status: DC
  Administered 2016-07-27 – 2016-07-28 (×2): 1 mg via ORAL
  Filled 2016-07-26 (×2): qty 1

## 2016-07-26 MED ORDER — APIXABAN 5 MG PO TABS
5.0000 mg | ORAL_TABLET | Freq: Two times a day (BID) | ORAL | Status: DC
  Administered 2016-07-27 – 2016-07-28 (×3): 5 mg via ORAL
  Filled 2016-07-26 (×3): qty 1

## 2016-07-26 MED ORDER — MIDAZOLAM HCL 5 MG/5ML IJ SOLN
INTRAMUSCULAR | Status: AC
  Filled 2016-07-26: qty 5

## 2016-07-26 MED ORDER — LORAZEPAM 2 MG/ML IJ SOLN: 0.5000 mg | INTRAMUSCULAR | Status: DC | PRN

## 2016-07-26 MED ORDER — THIAMINE HCL 100 MG/ML IJ SOLN: 100.0000 mg | Freq: Every day | INTRAMUSCULAR | Status: DC

## 2016-07-26 MED ORDER — VITAMIN B-1 100 MG PO TABS
100.0000 mg | ORAL_TABLET | Freq: Every day | ORAL | Status: DC
  Administered 2016-07-26 – 2016-07-27 (×2): 100 mg via ORAL
  Filled 2016-07-26 (×2): qty 1

## 2016-07-26 MED ORDER — LORAZEPAM 2 MG/ML IJ SOLN
1.0000 mg | Freq: Four times a day (QID) | INTRAMUSCULAR | Status: DC | PRN
  Filled 2016-07-26 (×2): qty 1

## 2016-07-26 MED ORDER — THIAMINE HCL 100 MG/ML IJ SOLN: 100.0000 mg | Freq: Once | INTRAMUSCULAR | Status: DC

## 2016-07-26 MED ORDER — HEPARIN (PORCINE) IN NACL 100-0.45 UNIT/ML-% IJ SOLN
INTRAMUSCULAR | Status: AC
  Administered 2016-07-26: 900 [IU]/h via INTRAVENOUS
  Filled 2016-07-26: qty 250

## 2016-07-26 MED ORDER — LIDOCAINE-EPINEPHRINE (PF) 2 %-1:200000 IJ SOLN
INTRAMUSCULAR | Status: AC
  Filled 2016-07-26: qty 20

## 2016-07-26 MED ORDER — HEPARIN SODIUM (PORCINE) 1000 UNIT/ML IJ SOLN
INTRAMUSCULAR | Status: DC | PRN
  Administered 2016-07-26: 4000 [IU] via INTRAVENOUS

## 2016-07-26 MED ORDER — FENTANYL CITRATE (PF) 100 MCG/2ML IJ SOLN
INTRAMUSCULAR | Status: DC | PRN
  Administered 2016-07-26: 50 ug via INTRAVENOUS
  Administered 2016-07-26: 50 ug

## 2016-07-26 SURGICAL SUPPLY — 15 items
BALLN LUTONIX DCB 5X80X130 (BALLOONS) ×2
BALLN LUTONIX DCB 6X60X130 (BALLOONS) ×2
BALLOON LUTONIX DCB 5X80X130 (BALLOONS) ×1 IMPLANT
BALLOON LUTONIX DCB 6X60X130 (BALLOONS) ×1 IMPLANT
CATH BEACON 5.038 65CM KMP-01 (CATHETERS) ×2 IMPLANT
CATH PIG 70CM (CATHETERS) ×2 IMPLANT
DEVICE PRESTO INFLATION (MISCELLANEOUS) ×2 IMPLANT
DEVICE STARCLOSE SE CLOSURE (Vascular Products) ×2 IMPLANT
GLIDEWIRE ADV .035X260CM (WIRE) ×2 IMPLANT
GUIDEWIRE AMPLATZ SHORT (WIRE) ×2 IMPLANT
PACK ANGIOGRAPHY (CUSTOM PROCEDURE TRAY) ×2 IMPLANT
SHEATH ANL2 6FRX45 HC (SHEATH) ×2 IMPLANT
SHEATH BRITE TIP 4FRX11 (SHEATH) ×2 IMPLANT
SHEATH BRITE TIP 5FRX11 (SHEATH) ×2 IMPLANT
WIRE J 3MM .035X145CM (WIRE) ×2 IMPLANT

## 2016-07-26 NOTE — Progress Notes (Signed)
ANTICOAGULATION CONSULT NOTE - FOLLOW UP Consult  Pharmacy Consult for heparin drip  Indication: Peripheral artery occlusion        Allergies  Allergen Reactions  . Penicillins     Has patient had a PCN reaction causing immediate rash, facial/tongue/throat swelling, SOB or lightheadedness with hypotension: Yes Has patient had a PCN reaction causing severe rash involving mucus membranes or skin necrosis: No Has patient had a PCN reaction that required hospitalization No Has patient had a PCN reaction occurring within the last 10 years: No If all of the above answers are "NO", then may proceed with Cephalosporin use.   . Statins   . Lipitor [Atorvastatin]     Patient Measurements: Weight: 145 lb (65.8 kg)  Vital Signs: Temp: 98.2 F (36.8 C) (03/13 1243) Temp Source: Oral (03/13 1243) BP: 111/78 (03/13 1243) Pulse Rate: 105 (03/13 1243)  Labs: Recent Labs (last 2 labs)   No results for input(s): HGB, HCT, PLT, APTT, LABPROT, INR, HEPARINUNFRC, HEPRLOWMOCWT, CREATININE, CKTOTAL, CKMB, TROPONINI in the last 72 hours.    CrCl cannot be calculated (Patient's most recent lab result is older than the maximum 21 days allowed.).   Medical History:     Past Medical History:  Diagnosis Date  . Dyslipidemia   . Ectopic atrial tachycardia (HCC)   . Environmental allergies   . H/O scarlet fever   . Heart murmur   . HOH (hard of hearing)   . Hyperlipidemia   . Hypertension   . Peripheral vascular disease (HCC)   . 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 Harvie BridgeSagi N. [place], [place].  . Transient cerebral ischemia     Assessment: 82 yo female with claudication in peripheral vascular disease and peripheral artery occlusion. Pharmacy consulted for heparin dosing and monitoring.   Patient dose take apixaban 5mg  BID at home- last dose was yesterday evening (3/12) per patient.   Goal of  Therapy:  Heparin level 0.3-0.7 units/ml aPTT 66-102 seconds Monitor platelets by anticoagulation protocol: Yes   Plan:  Baseline labs ordered. Baseline heparin level <0.10.  Will need to adjust heparin dose using HL since baseline HL was not elevated.  Start heparin infusion at 900 units/hr Check anti-Xa  level in 8 hours and daily while on heparin Continue to monitor H&H and platelets  3/13 @ 2314: HL 0.38 within therapeutic range. Will continue current rate and recheck next HL 3/14 @ 0700. Will continue to monitor CBC.  3/14: Heparin level resulted @ 0.54. Will recheck Heparin level with am labs.    Thank you for this consult.  Demetrius Charityeldrin D. Kryssa Risenhoover, PharmD  Clinical Pharmacist 07/26/2016

## 2016-07-26 NOTE — Progress Notes (Signed)
I have had a long discussion with patient and daughter.  Concern over limb threatening ischemia persists, and the importance of angiography was discussed with the patient and the daughter.  Her confusion is likely ETOH withdrawal and dementia.  Very poor short term memory, but she is now pleasant and agreeable to proceed with angiogram as long as I am performing it.  I will plan RLE angiogram this afternoon.

## 2016-07-26 NOTE — Progress Notes (Signed)
Daughter at the bedside and police remain at the bedside. Psych and medical; consult ordered by Dr. Gilda CreaseSchnier. Physical sitter at the bedside. Since tele- sitter is not appropriate at this time.

## 2016-07-26 NOTE — Consult Note (Signed)
[facility] - Inpatient Physicians - [place] [facility]   PATIENT NAME: Ashley Hill    MR#:  [id number]  DATE OF BIRTH:  11-Jul-1934  DATE OF ADMISSION:  07/25/2016  PRIMARY CARE PHYSICIAN: Marguarite Arbour, MD   REQUESTING/REFERRING PHYSICIAN: Dr. Levora Dredge  CHIEF COMPLAINT:   Chief Complaint  Patient presents with  . Leg Pain    HISTORY OF PRESENT ILLNESS:  Ashley Hill  is a 82 y.o. female with a known history of Dementia, atrial tachycardia, peripheral vascular disease with prior angiograms and angioplasties, hypertension presents from home to the emergency room secondary to right foot cyanosis and calf pain. Patient has had prior angiograms done to both lower extremities for peripheral vascular disease. She says she has been compliant with her eliquis. Patient also has dementia. She is a retired Engineer, civil (consulting). She was having issues with right foot being cold with numbness of the toes and also calf pain for the last few weeks. She has seen her vascular surgeon in the office last week and was supposed to get a Doppler ultrasound in the office. However her pain got worse and she has noticed that her right foot was turning purple intermittently and so presented to the emergency room yesterday. Plan was to do angiogram and angioplasty possibly for the right leg today. However patient had a rough night due to her dementia and has been very confused and this morning was agitated and wanted to walk out of the hospital. She is more calm with her daughter being present at her bedside at this time. She has an angiogram scheduled for this afternoon. Medical consult requested for medical management.  PAST MEDICAL HISTORY:   Past Medical History:  Diagnosis Date  . Dyslipidemia   . Ectopic atrial tachycardia (HCC)   . Environmental allergies   . H/O scarlet fever   . Heart murmur   . HOH (hard of hearing)   . Hyperlipidemia   . Hypertension   . Peripheral vascular disease (HCC)   . 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 Harvie Bridge, [place].  . Transient cerebral ischemia     PAST SURGICAL HISTOIRY:   Past Surgical History:  Procedure Laterality Date  . ABLATION OF DYSRHYTHMIC FOCUS    . AUGMENTATION MAMMAPLASTY Bilateral   . JOINT REPLACEMENT     knee  . KNEE ARTHROSCOPY W/ MENISCAL REPAIR     Right   . PERIPHERAL VASCULAR CATHETERIZATION Right 01/11/2015   Procedure: Lower Extremity Angiography;  Surgeon: Annice Needy, MD;  Location: [facility] INVASIVE [facility];  Service: Cardiovascular;  Laterality: Right;  . PERIPHERAL VASCULAR CATHETERIZATION Right 06/21/2015   Procedure: Lower Extremity Angiography;  Surgeon: Annice Needy, MD;  Location: [facility];  Service: Cardiovascular;  Laterality: Right;  . PERIPHERAL VASCULAR CATHETERIZATION Right 06/21/2015   Procedure: Lower Extremity Intervention;  Surgeon: Annice Needy, MD;  Location: [facility];  Service: Cardiovascular;  Laterality: Right;  . PERIPHERAL VASCULAR CATHETERIZATION Right 10/12/2015   Procedure: Lower Extremity Angiography;  Surgeon: Annice Needy, MD;  Location: [facility];  Service: Cardiovascular;  Laterality: Right;  . PERIPHERAL VASCULAR CATHETERIZATION Right 10/12/2015   Procedure: Lower Extremity Intervention;  Surgeon: Annice Needy, MD;  Location: [facility];  Service: Cardiovascular;  Laterality: Right;  . PERIPHERAL VASCULAR CATHETERIZATION Left 02/10/2016   Procedure: Lower Extremity Angiography;  Surgeon: Annice Needy, MD;  Location:  [facility];  Service: Cardiovascular;  Laterality: Left;  . PERIPHERAL VASCULAR CATHETERIZATION  02/10/2016   Procedure: Lower Extremity Intervention;  Surgeon: Annice Needy, MD;  Location: [facility];  Service: Cardiovascular;;  . PERIPHERAL VASCULAR CATHETERIZATION Right 02/24/2016   Procedure: Lower Extremity Angiography;  Surgeon: Annice Needy, MD;  Location:  [facility];  Service: Cardiovascular;  Laterality: Right;  . SHOULDER CLOSED REDUCTION Right 04/10/2016   Procedure: CLOSED REDUCTION SHOULDER;  Surgeon: Kennedy Bucker, MD;  Location: [facility] OR[facility];  Service: Orthopedics;  Laterality: Right;  . TONSILLECTOMY    . TOTAL KNEE ARTHROPLASTY  2015  . VAGINAL HYSTERECTOMY      SOCIAL HISTORY:   Social History  Substance Use Topics  . Smoking status: Former Smoker    Packs/day: 1.00    Years: 10.00    Types: Cigarettes    Quit date: 02/23/1981  . Smokeless tobacco: Never Used  . Alcohol use No    FAMILY HISTORY:   Family History  Problem Relation Age of Onset  . Alcohol abuse Father   . Cancer Father   . Diabetes Paternal Grandfather   . Breast cancer Neg Hx     DRUG ALLERGIES:   Allergies  Allergen Reactions  . Penicillins     Has patient had a PCN reaction causing immediate rash, facial/tongue/throat swelling, SOB or lightheadedness with hypotension: Yes Has patient had a PCN reaction causing severe rash involving mucus membranes or skin necrosis: No Has patient had a PCN reaction that required hospitalization No Has patient had a PCN reaction occurring within the last 10 years: No If all of the above answers are "NO", then may proceed with Cephalosporin use.   . Statins   . Lipitor [Atorvastatin]     REVIEW OF SYSTEMS:   Review of Systems  Unable to perform ROS: Mental status change     MEDICATIONS AT HOME:   Prior to Admission medications   Medication Sig Start Date End Date Taking? Authorizing Provider  amLODipine (NORVASC) 5 MG tablet Take 5 mg by mouth daily.   Yes Historical Provider, MD  apixaban (ELIQUIS) 5 MG TABS tablet Take 1 tablet (5 mg total) by mouth 2 (two) times daily. 10/12/15  Yes Annice Needy, MD  aspirin 81 MG tablet Take 81 mg by mouth daily.   Yes Historical Provider, MD  b complex vitamins tablet Take 1 tablet by mouth daily.   Yes Historical Provider, MD  CALCIUM PO Take 750 mg by  mouth 2 (two) times daily.   Yes Historical Provider, MD  carvedilol (COREG) 25 MG tablet Take 25 mg by mouth 2 (two) times daily with a meal.   Yes Historical Provider, MD  cholecalciferol (VITAMIN D) 1000 units tablet Take 2,000 Units by mouth daily.   Yes Historical Provider, MD  Coenzyme Q10 (COQ10) 50 MG CAPS Take 1 capsule by mouth daily.   Yes Historical Provider, MD  CRESTOR 5 MG tablet TAKE 1 TABLET BY MOUTH EVERY DAY 04/14/14  Yes Iran Ouch, MD  hydrochlorothiazide (HYDRODIURIL) 25 MG tablet TAKE 1 TABLET BY MOUTH EVERY DAY 05/03/15  Yes Iran Ouch, MD  Turmeric 450 MG CAPS Take 450 mg by mouth daily.   Yes Historical Provider, MD  HYDROcodone-acetaminophen (NORCO) 5-325 MG tablet Take 1 tablet by mouth every 6 (six) hours as needed for moderate pain. Patient not taking: Reported on 07/21/2016 04/10/16   Kennedy Bucker, MD      VITAL  SIGNS:  Blood pressure (!) 142/67, pulse 79, temperature 97.4 F (36.3 C), temperature source Oral, resp. rate 18, height 5\' 5"  (1.651 m), weight 64.5 kg (142 lb 3.2 oz), SpO2 97 %.  PHYSICAL EXAMINATION:   Physical Exam  GENERAL:  82 y.o.-year-old patient lying in the bed with no acute distress.  EYES: Pupils equal, round, reactive to light and accommodation. No scleral icterus. Extraocular muscles intact.  HEENT: Head atraumatic, normocephalic. Oropharynx and nasopharynx clear.  NECK:  Supple, no jugular venous distention. No thyroid enlargement, no tenderness.  LUNGS: Normal breath sounds bilaterally, no wheezing, rales,rhonchi or crepitation. No use of accessory muscles of respiration.  CARDIOVASCULAR: S1, S2 normal. No murmurs, rubs, or gallops.  ABDOMEN: Soft, nontender, nondistended. Bowel sounds present. No organomegaly or mass.  EXTREMITIES: No pedal edema, cyanosis, or clubbing. Right foot is cold to touch with febrile dorsalis pedis and posterior tibial pulses. Extensive capillary varicose veins noted. No cyanosis. NEUROLOGIC:  Cranial nerves II through XII are intact. Muscle strength 5/5 in all extremities. Sensation intact. Gait not checked.  PSYCHIATRIC: The patient is alert and oriented x 1-2.  SKIN: No obvious rash, lesion, or ulcer.   LABORATORY PANEL:   CBC  Recent Labs Lab 07/26/16 0642  WBC 6.1  HGB 13.3  HCT 38.3  PLT 236   ------------------------------------------------------------------------------------------------------------------  Chemistries   Recent Labs Lab 07/26/16 0642  NA 139  K 4.0  CL 102  CO2 30  GLUCOSE 106*  BUN 18  CREATININE 0.56  CALCIUM 9.3   ------------------------------------------------------------------------------------------------------------------  Cardiac Enzymes No results for input(s): TROPONINI in the last 168 hours. ------------------------------------------------------------------------------------------------------------------  RADIOLOGY:  Ct Head Wo Contrast  Result Date: 07/25/2016 CLINICAL DATA:  Increase contusion. EXAM: CT HEAD WITHOUT CONTRAST TECHNIQUE: Contiguous axial images were obtained from the base of the skull through the vertex without intravenous contrast. COMPARISON:  01/21/2014 FINDINGS: Brain: Mild atrophy and white matter changes are present bilaterally. Acute infarct, hemorrhage, or mass lesion is present. Basal ganglia are intact. The insular ribbon is normal bilaterally. The brainstem and cerebellum are normal. Vascular: Atherosclerotic calcifications are present. There is no hyperdense vessel. Skull: The calvarium is intact. The left parietal lucency is stable. No focal lytic or blastic lesions are evident. Sinuses/Orbits: The paranasal sinuses and mastoid air cells are clear. Orbital calcifications are stable. The globes and orbits are otherwise unremarkable. IMPRESSION: 1. No acute intracranial abnormality or significant interval change. 2. Stable mild atrophy and white matter disease. Electronically Signed   By: Marin Roberts M.D.   On: 07/25/2016 14:54    EKG:   Orders placed or performed in visit on 07/07/14  . EKG 12-Lead    IMPRESSION AND PLAN:   Alysabeth Scalia  is a 82 y.o. female with a known history of Dementia, atrial tachycardia, peripheral vascular disease with prior angiograms and angioplasties, hypertension presents from home to the emergency room secondary to right foot cyanosis and calf pain.  #1 confusion-change in mental status secondary to underlying dementia and change in her environment. -Mostly talking to her and calming her, presents to family year phase-her daughter is helping her calm down. -Psych has been consulted. Q Ativan sparingly. -Sitter at bedside.  #2 peripheral vascular disease-management per vascular team. -Her right foot is not cyanotic but definitely cold with feeble pulses. -For angiogram and angioplasty today. Has been on heparin drip, off of eliquis for the procedure today.  #3 hypertension-on Norvasc, Coreg  #4 hyperlipidemia-Crestor is on hold currently.  #5 DVT prophylaxis-on  heparin drip. Eliquis can be restarted as per vascular recommendations  Patient is ambulatory at baseline.    All the records are reviewed and case discussed with Consulting provider. Management plans discussed with the patient, family and they are in agreement.  CODE STATUS: Full code  TOTAL TIME TAKING CARE OF THIS PATIENT: 50 minutes.    Enid BaasKALISETTI,Kaulder Zahner M.D on 07/26/2016 at 10:55 AM  Between 7am to 6pm - Pager - [phone]  After 6pm go to [email or web address] - password [email or web address] [email or web address]  [facility]  Office  [phone]  CC: Primary care Physician: Marguarite ArbourSPARKS,JEFFREY D, MD
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
## 2016-07-26 NOTE — Progress Notes (Signed)
Patient remains confused and is striking out at daughter, Medical consult in progress, awaiting for psych consult at this time. Patient refuses for IV to be restarted.

## 2016-07-26 NOTE — Progress Notes (Signed)
Dr. Wyn Quakerew at the bedside and updated on patient, Received orders to initiate CIWA orders on patient

## 2016-07-26 NOTE — Progress Notes (Signed)
Pharmacist - Prescriber Communication  Resume Eliquis 5 mg po BID 07/27/16 AM. Discontine heparin drip at the time of discontinuation of heparin drip.  Chalonda Schlatter A. [place], Vermont[place]arm.D., BCPS Clinical Pharmacist 07/26/2016 2007

## 2016-07-26 NOTE — Progress Notes (Signed)
ANTICOAGULATION CONSULT NOTE - Initial Consult  Pharmacy Consult for heparin drip  Indication: Peripheral artery occlusion        Allergies  Allergen Reactions  . Penicillins     Has patient had a PCN reaction causing immediate rash, facial/tongue/throat swelling, SOB or lightheadedness with hypotension: Yes Has patient had a PCN reaction causing severe rash involving mucus membranes or skin necrosis: No Has patient had a PCN reaction that required hospitalization No Has patient had a PCN reaction occurring within the last 10 years: No If all of the above answers are "NO", then may proceed with Cephalosporin use.   . Statins   . Lipitor [Atorvastatin]     Patient Measurements: Weight: 145 lb (65.8 kg)  Vital Signs: Temp: 98.2 F (36.8 C) (03/13 1243) Temp Source: Oral (03/13 1243) BP: 111/78 (03/13 1243) Pulse Rate: 105 (03/13 1243)  Labs: Recent Labs (last 2 labs)   No results for input(s): HGB, HCT, PLT, APTT, LABPROT, INR, HEPARINUNFRC, HEPRLOWMOCWT, CREATININE, CKTOTAL, CKMB, TROPONINI in the last 72 hours.    CrCl cannot be calculated (Patient's most recent lab result is older than the maximum 21 days allowed.).   Medical History:     Past Medical History:  Diagnosis Date  . Dyslipidemia   . Ectopic atrial tachycardia (HCC)   . Environmental allergies   . H/O scarlet fever   . Heart murmur   . HOH (hard of hearing)   . Hyperlipidemia   . Hypertension   . Peripheral vascular disease (HCC)   . 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 Har[place]  . Transient cerebral ischemia     Assessment: 82 yo female with claudication in peripheral vascular disease and peripheral artery occlusion. Pharmacy consulted for heparin dosing and monitoring.   Patient dose take apixaban 5mg  BID at home- last dose was yesterday evening (3/12) per patient.   Goal of Therapy:   Heparin level 0.3-0.7 units/ml aPTT 66-102 seconds Monitor platelets by anticoagulation protocol: Yes   Plan:  Baseline labs ordered. Baseline heparin level <0.10.  Will need to adjust heparin dose using HL since baseline HL was not elevated.  Start heparin infusion at 900 units/hr Check anti-Xa  level in 8 hours and daily while on heparin Continue to monitor H&H and platelets  3/13 @ 2314: HL 0.38 within therapeutic range. Will continue current rate and recheck next HL 3/14 @ 0700. Will continue to monitor CBC.  Thank you for this consult.  Thomasene Rippleavid Tashauna Caisse, PharmD, BCPS Clinical Pharmacist 07/26/2016

## 2016-07-26 NOTE — Progress Notes (Signed)
Also later notified that patient has severe alcohol abuse problems. So he is started on CIWA protocol.
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
## 2016-07-26 NOTE — Progress Notes (Signed)
Continues to be impulsive, trying to take care of herself and getting OOB; reoriented and strongly encouraged to call for all/any assistance, bed alarm set at "sitting up" level; RLE continues to have faint palpable pulse; no blue color noted overnight; stated that her right foot was numb on the right, bottom when she came in and now the "whole foot is numb".  NPO for angio this am; Windy Carinaurner,Zakariyya Helfman K, RN 6:35 AM 07/26/2016

## 2016-07-26 NOTE — Op Note (Addendum)
[facility] Percutaneous Study/Intervention Procedural Note   Date of Surgery:  07/26/2016  Surgeon(s):Arun Herrod   Assistants:none  Pre-operative Diagnosis: PAD with rest pain, acute on chronic ischemia  Post-operative diagnosis: Same  Procedure(s) Performed: 1. Ultrasound guidance for vascular access left femoral artery 2. Catheter placement into right profunda femoris artery from left femoral approach 3. Aortogram and selective right lower extremity angiogram 4. Percutaneous transluminal angioplasty of right profunda femoris artery and common femoral artery with 5 mm diameter Lutonix drug-coated angioplasty balloon and a second angioplasty with a 6 mm diameter Lutonix drug-coated balloon to the proximal to mid right common femoral artery 5. StarClose closure device left femoral artery  EBL: 5 cc   Contrast: 35 cc  Fluoro Time: 2.9 minutes  Moderate Conscious Sedation Time: approximately 35 minutes using 3 mg of Versed and 100 mcg of Fentanyl  Indications: Patient is a 82 y.o.female with recurrent rest pain of the right leg.  She has acute on chronic ischemia and presented to the emergency department yesterday. The patient is brought in for angiography for further evaluation and potential treatment. Risks and benefits are discussed and informed consent is obtained  Procedure: The patient was identified and appropriate procedural time out was performed. The patient was then placed supine on the table and prepped and draped in the usual sterile fashion.Moderate conscious sedation was administered during a face to face encounter with the patient throughout the procedure with my supervision of the RN administering medicines and monitoring the patient's vital signs, pulse oximetry, telemetry and mental status throughout from the start of the procedure until the patient was taken to  the recovery room. Ultrasound was used to evaluate the left common femoral artery. It was patent . A digital ultrasound image was acquired. A Seldinger needle was used to access the left common femoral artery under direct ultrasound guidance and a permanent image was performed. A 0.035 J wire was advanced without resistance and a 5Fr sheath was placed. Pigtail catheter was placed into the aorta and an AP aortogram was performed. This demonstrated normal renal arteries and normal aorta and iliac segments without significant stenosis. I then crossed the aortic bifurcation and advanced to the right femoral head. Selective right lower extremity angiogram was then performed. This demonstrated right SFA and popliteal arteries are reoccluded.  There is distal reconstitution through the anterior tibial artery which is continuous to the foot.  The profunda femorus artery has about a 80-90% stenosis at its origin.  The right common femoral artery has disease and hyperplasia resulting in approximately 60% stenosis. I felt this point if we can improve her perfusion to the profunda femoris artery and common femoral artery, she would likely be out of rest pain. The patient was systemically heparinized and a 6 [place] Ansell sheath was then placed over the [employer] wire. I then used a Kumpe catheter and the advantage wire to navigate across the common femoral artery stenosis and profunda femoris artery stenosis and confirm intraluminal flow well beyond the stenosis in the profunda femoris artery on the right. The wire was then replaced. I took a 5 mm diameter by 8 cm length Lutonix drug-coated angioplasty balloon. About 2 cm of the balloon were placed into the profunda femoris artery with the remainder balloon in the common femoral artery. This was inflated to 10 atm for 1 minute. Completion angiogram showed only about a 15-20% residual stenosis in the proximal profunda femoris artery, but about a 40-50% residual  stenosis was  present in the common femoral artery. I upsized to a 6 mm diameter by 6 cm length Lutonix drug-coated angioplasty balloon and treated the common femoral artery alone taking care not to injure the profunda femoris artery. This was inflated to 12 atm for 1 minute. Completion angiogram still showed about a 30% residual stenosis in the common femoral artery but this did not appear flow limiting and would be a location were stent placement would be suboptimal. Should she have recurrent failure from this, a femoral endarterectomy would likely be necessary. I elected to terminate the procedure. The sheath was removed and StarClose closure device was deployed in the left femoral artery with excellent hemostatic result. The patient was taken to the recovery room in stable condition having tolerated the procedure well.  Findings:  Aortogram: normal renal arteries, normal aorta and iliac arteries without significant stenosis Right Lower Extremity: right SFA and popliteal arteries are reoccluded.  There is distal reconstitution through the anterior tibial artery which is continuous to the foot.  The profunda femorus artery has about a 80-90% stenosis at its origin.  The common femoral artery has disease and hyperplasia resulting in approximately 60% stenosis.     Disposition: Patient was taken to the recovery room in stable condition having tolerated the procedure well.  Complications: None  [name] 07/26/2016 3:20 PM   This note was created with Dragon Medical transcription system. Any errors in dictation are purely unintentional.

## 2016-07-26 NOTE — Progress Notes (Signed)
Patient daughter stated that patient is a heavy drinker that often hide bottles at home.Dr. Toni Amendlapacs at the bedside and made aware of patient excessive drinking. Dr. Wyn Quakerew also updated on patients drinking at home.

## 2016-07-26 NOTE — Progress Notes (Signed)
Spoke to Dr. Wyn Quakerew about changing the patient's sitter order to PRN.  The patient's daughter is staying overnight and the patient is much more pleasant this evening that prior to the procedure.  He gave a verbal order to change the order.

## 2016-07-27 ENCOUNTER — Encounter: Payer: Self-pay | Admitting: Vascular Surgery

## 2016-07-27 DIAGNOSIS — I70221 Atherosclerosis of native arteries of extremities with rest pain, right leg: Secondary | ICD-10-CM

## 2016-07-27 LAB — CBC
HCT: 37.5 % (ref 35.0–47.0)
Hemoglobin: 12.8 g/dL (ref 12.0–16.0)
MCH: 31.6 pg (ref 26.0–34.0)
MCHC: 34.3 g/dL (ref 32.0–36.0)
MCV: 92.3 fL (ref 80.0–100.0)
PLATELETS: 231 10*3/uL (ref 150–440)
RBC: 4.06 MIL/uL (ref 3.80–5.20)
RDW: 14.1 % (ref 11.5–14.5)
WBC: 5.8 10*3/uL (ref 3.6–11.0)

## 2016-07-27 LAB — HEPARIN LEVEL (UNFRACTIONATED)
HEPARIN UNFRACTIONATED: 0.66 [IU]/mL (ref 0.30–0.70)
Heparin Unfractionated: 0.79 IU/mL — ABNORMAL HIGH (ref 0.30–0.70)

## 2016-07-27 MED ORDER — HYDROCHLOROTHIAZIDE 25 MG PO TABS
25.0000 mg | ORAL_TABLET | Freq: Every day | ORAL | Status: DC
  Administered 2016-07-27 – 2016-07-28 (×2): 25 mg via ORAL
  Filled 2016-07-27 (×2): qty 1

## 2016-07-27 MED ORDER — LIDOCAINE-EPINEPHRINE (PF) 2 %-1:200000 IJ SOLN
INTRAMUSCULAR | Status: AC
  Filled 2016-07-27: qty 20

## 2016-07-27 MED ORDER — AMLODIPINE BESYLATE 10 MG PO TABS
10.0000 mg | ORAL_TABLET | Freq: Every day | ORAL | Status: DC
  Administered 2016-07-27 – 2016-07-28 (×2): 10 mg via ORAL
  Filled 2016-07-27 (×2): qty 1

## 2016-07-27 MED ORDER — AMLODIPINE BESYLATE 10 MG PO TABS: 10.0000 mg | ORAL_TABLET | Freq: Every day | ORAL | Status: DC

## 2016-07-27 NOTE — Progress Notes (Addendum)
Pt having bleeding from the incision site in the left groin early in shift Pt also agitated and wanting to get out of bed frequently. MD notified about situation. Pressure added to PVA as needed and Ativan admin to Pt for agitation. This AM Pt turned to side during sleep and dressing popped off causing moderate bleeding from the site, clot present at entry site. Dressing changed with 40 cc air added to PVA. Pharmacy consulted as Pt  has Heparin drip infusing and a Heparin level was ordered. Per pharmacy Heparin drip rate was decreased from 9 ml/hr to 8 ml/hr. MD was updated on the situation. No further orders placed. Pt. Dressing clean, dry and intact at end of shift. Scant amount of drainage seen below bulb.

## 2016-07-27 NOTE — Progress Notes (Signed)
[facility]  Daily Progress Note   Subjective  - 1 Day Post-Op  Had bleeding from the access site this am while on heparin.  Heparin stopped and injection of lidocaine with epi given.  The bleeding has now stopped and the dressing is C/D/I and no hematoma present.   She is pleasant.  Asks about staying tonight as she has not been mobile and concerned about bleeding  Objective Vitals:   07/26/16 1646 07/27/16 0340 07/27/16 0914 07/27/16 1130  BP: (!) 136/55 (!) 162/60 (!) 175/63 (!) 139/51  Pulse: 78 77 70 79  Resp: 18 18    Temp: 97.4 F (36.3 C) 98.8 F (37.1 C)    TempSrc: Oral     SpO2: 97% 97%    Weight:      Height:        Intake/Output Summary (Last 24 hours) at 07/27/16 1237 Last data filed at 07/27/16 0800  Gross per 24 hour  Intake              140 ml  Output              200 ml  Net              -60 ml    PULM  CTAB CV  RRR VASC  Foot warm with excellent capillary refill.  Access site as above.  Laboratory CBC    Component Value Date/Time   WBC 5.8 07/27/2016 0433   HGB 12.8 07/27/2016 0433   HCT 37.5 07/27/2016 0433   PLT 231 07/27/2016 0433    BMET    Component Value Date/Time   NA 139 07/26/2016 0642   NA 142 04/29/2013 1023   K 4.0 07/26/2016 0642   CL 102 07/26/2016 0642   CO2 30 07/26/2016 0642   GLUCOSE 106 (H) 07/26/2016 0642   BUN 18 07/26/2016 0642   BUN 22 04/29/2013 1023   CREATININE 0.56 07/26/2016 0642   CALCIUM 9.3 07/26/2016 0642   GFRNONAA >60 07/26/2016 0642   GFRAA >60 07/26/2016 [id number]    Assessment/Planning: POD #1 s/p RLE angiogram and angioplasty   IV heparin stopped  Will convert to Eliquis  Staying until tomorrow is reasonable given bleeding earlier today  Check CBC tomorrow am    Festus BarrenJason Dew  07/27/2016, 12:37 PM

## 2016-07-27 NOTE — Progress Notes (Signed)
Per Dr. Wyn Quakerew okay to discontinue order for continuous pulse ox

## 2016-07-27 NOTE — Progress Notes (Signed)
[facility] [place] [facility]   PATIENT NAME: Ashley Hill    MR#:  [id number]  DATE OF BIRTH:  October 27, 1934  SUBJECTIVE:  Denies any complaints. Sitter in the room. Patient has intermittent agitation and after procedure tried to get up to walk. No more bleeding at the site of left groin. Bilateral lower extremity feels warm to touch  REVIEW OF SYSTEMS:   Review of Systems  Constitutional: Negative for chills, fever and weight loss.  HENT: Negative for ear discharge, ear pain and nosebleeds.   Eyes: Negative for blurred vision, pain and discharge.  Respiratory: Negative for sputum production, shortness of breath, wheezing and stridor.   Cardiovascular: Negative for chest pain, palpitations, orthopnea and PND.  Gastrointestinal: Negative for abdominal pain, diarrhea, nausea and vomiting.  Genitourinary: Negative for frequency and urgency.  Musculoskeletal: Negative for back pain and joint pain.  Neurological: Positive for weakness. Negative for sensory change, speech change and focal weakness.  Psychiatric/Behavioral: Negative for depression and hallucinations. The patient is nervous/anxious.    Tolerating Diet:yes Tolerating PT: pending  DRUG ALLERGIES:   Allergies  Allergen Reactions  . Penicillins     Has patient had a PCN reaction causing immediate rash, facial/tongue/throat swelling, SOB or lightheadedness with hypotension: Yes Has patient had a PCN reaction causing severe rash involving mucus membranes or skin necrosis: No Has patient had a PCN reaction that required hospitalization No Has patient had a PCN reaction occurring within the last 10 years: No If all of the above answers are "NO", then may proceed with Cephalosporin use.   . Statins   . Lipitor [Atorvastatin]     VITALS:  Blood pressure (!) 134/58, pulse 74, temperature 98.6 F (37 C), temperature source Oral, resp. rate 18, height 5\' 5"  (1.651 m), weight 64.5 kg (142 lb 3.2 oz),  SpO2 98 %.  PHYSICAL EXAMINATION:   Physical Exam  GENERAL:  82 y.o.-year-old patient lying in the bed with no acute distress.  EYES: Pupils equal, round, reactive to light and accommodation. No scleral icterus. Extraocular muscles intact.  HEENT: Head atraumatic, normocephalic. Oropharynx and nasopharynx clear.  NECK:  Supple, no jugular venous distention. No thyroid enlargement, no tenderness.  LUNGS: Normal breath sounds bilaterally, no wheezing, rales, rhonchi. No use of accessory muscles of respiration.  CARDIOVASCULAR: S1, S2 normal. No murmurs, rubs, or gallops.  ABDOMEN: Soft, nontender, nondistended. Bowel sounds present. No organomegaly or mass.  EXTREMITIES: No cyanosis, clubbing or edema b/l.    NEUROLOGIC: Cranial nerves II through XII are intact. No focal Motor or sensory deficits b/l.   PSYCHIATRIC:  patient is alert and oriented x 3.  SKIN: No obvious rash, lesion, or ulcer.   LABORATORY PANEL:  CBC  Recent Labs Lab 07/27/16 0433  WBC 5.8  HGB 12.8  HCT 37.5  PLT 231    Chemistries   Recent Labs Lab 07/26/16 0642  NA 139  K 4.0  CL 102  CO2 30  GLUCOSE 106*  BUN 18  CREATININE 0.56  CALCIUM 9.3   Cardiac Enzymes No results for input(s): TROPONINI in the last 168 hours. RADIOLOGY:  Ct Head Wo Contrast  Result Date: 07/25/2016 CLINICAL DATA:  Increase contusion. EXAM: CT HEAD WITHOUT CONTRAST TECHNIQUE: Contiguous axial images were obtained from the base of the skull through the vertex without intravenous contrast. COMPARISON:  01/21/2014 FINDINGS: Brain: Mild atrophy and white matter changes are present bilaterally. Acute infarct, hemorrhage, or mass lesion is present. Basal ganglia are intact.  The insular ribbon is normal bilaterally. The brainstem and cerebellum are normal. Vascular: Atherosclerotic calcifications are present. There is no hyperdense vessel. Skull: The calvarium is intact. The left parietal lucency is stable. No focal lytic or blastic  lesions are evident. Sinuses/Orbits: The paranasal sinuses and mastoid air cells are clear. Orbital calcifications are stable. The globes and orbits are otherwise unremarkable. IMPRESSION: 1. No acute intracranial abnormality or significant interval change. 2. Stable mild atrophy and white matter disease. Electronically Signed   By: Marin Robertshristopher  Mattern M.D.   On: 07/25/2016 14:54   ASSESSMENT AND PLAN:  Ashley Hill  is a 82 y.o. female with a known history of Dementia, atrial tachycardia, peripheral vascular disease with prior angiograms and angioplasties, hypertension presents from home to the emergency room secondary to right foot cyanosis and calf pain.  #1 confusion-change in mental status secondary to underlying dementia and change in her environment. -Mostly talking to her and calming her, presents to family year phase-her daughter is helping her calm down. -Psych Consult appreciated. Q Ativan sparingly. -Sitter at bedside. -Patient is much more calm  #2 peripheral vascular disease-management per vascular team. -Her right foot is not cyanotic but definitely cold with feeble pulses. -Status post ballooning to plasty right lower extremity. Has been on heparin drip, off of eliquis for the procedure today. -We'll defer resuming eliquis per Dr Wyn Quakerew  #3 hypertension-on Norvasc, Coreg, HCTZ  #4 hyperlipidemia-Crestor is on hold currently.  #5 DVT prophylaxis-on heparin drip. Eliquis can be restarted as per vascular recommendations  Patient is ambulatory at baseline. Patient overall appears to be at baseline. I will sign off. Call if needed.  Defer discharge to the admitting physician.  Case discussed with Care Management/Social Worker. Management plans discussed with the patient, family and they are in agreement.  CODE STATUS: full  DVT Prophylaxis: heparin  TOTAL TIME TAKING CARE OF THIS PATIENT:30 minutes.  >50% time spent on counselling and coordination of care   Note: This  dictation was prepared with Dragon dictation along with smaller phrase technology. Any transcriptional errors that result from this process are unintentional.  Hiawatha Dressel M.D on 07/27/2016 at 2:38 PM  Between 7am to 6pm - Pager - [phone]  After 6pm go to [email or web address] - password [email or web address]  [facility]  Office  [phone]  CC: Primary care physician; Marguarite ArbourSPARKS,JEFFREY D, MD

## 2016-07-27 NOTE — Progress Notes (Signed)
ANTICOAGULATION CONSULT NOTE - FOLLOW UP Consult  Pharmacy Consult for heparin drip  Indication: Peripheral artery occlusion        Allergies  Allergen Reactions  . Penicillins     Has patient had a PCN reaction causing immediate rash, facial/tongue/throat swelling, SOB or lightheadedness with hypotension: Yes Has patient had a PCN reaction causing severe rash involving mucus membranes or skin necrosis: No Has patient had a PCN reaction that required hospitalization No Has patient had a PCN reaction occurring within the last 10 years: No If all of the above answers are "NO", then may proceed with Cephalosporin use.   . Statins   . Lipitor [Atorvastatin]     Patient Measurements: Weight: 145 lb (65.8 kg)  Vital Signs: Temp: 98.2 F (36.8 C) (03/13 1243) Temp Source: Oral (03/13 1243) BP: 111/78 (03/13 1243) Pulse Rate: 105 (03/13 1243)  Labs: Recent Labs (last 2 labs)   No results for input(s): HGB, HCT, PLT, APTT, LABPROT, INR, HEPARINUNFRC, HEPRLOWMOCWT, CREATININE, CKTOTAL, CKMB, TROPONINI in the last 72 hours.    CrCl cannot be calculated (Patient's most recent lab result is older than the maximum 21 days allowed.).   Medical History:     Past Medical History:  Diagnosis Date  . Dyslipidemia   . Ectopic atrial tachycardia (HCC)   . Environmental allergies   . H/O scarlet fever   . Heart murmur   . HOH (hard of hearing)   . Hyperlipidemia   . Hypertension   . Peripheral vascular disease (HCC)   . 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 [place]agi [place]  . Transient cerebral ischemia     Assessment: 82 yo female with claudication in peripheral vascular disease and peripheral artery occlusion. Pharmacy consulted for heparin dosing and monitoring.   Patient dose take apixaban 5mg  BID at home- last dose was yesterday evening (3/12) per patient.   Goal  of Therapy: Heparin level 0.3-0.7 units/ml aPTT 66-102seconds Monitor platelets by anticoagulation protocol: Yes  Plan: Baseline labs ordered. Baseline heparin level <0.10. Will need to adjust heparin dose using HL since baseline HL was not elevated.  Start heparin infusion at 900units/hr Check anti-Xa level in 8hoursand daily while on heparin Continue to monitor H&H and platelets  3/13 @ 2314: HL 0.38 within therapeutic range. Will continue current rate and recheck next HL 3/14 @ 0700. Will continue to monitor CBC.  3/14: Heparin level resulted @ 0.54. Will recheck Heparin level with am labs.   3/15: HL 0.79 will decrease rate to 800 units/hour and will recheck next HL 3/15 @ 1000, patient scheduled to be switched to apixaban from heparin @ 1000 3/15. Patient is also bleeding from her balloon angio site informed by RN, RN to inform MD.   Thank you for this consult.  Thomasene Rippleavid Eleanor Dimichele, PharmD, BCPS Clinical Pharmacist 07/27/2016

## 2016-07-28 DIAGNOSIS — I70221 Atherosclerosis of native arteries of extremities with rest pain, right leg: Secondary | ICD-10-CM

## 2016-07-28 LAB — CBC
HEMATOCRIT: 37 % (ref 35.0–47.0)
Hemoglobin: 12.8 g/dL (ref 12.0–16.0)
MCH: 31.6 pg (ref 26.0–34.0)
MCHC: 34.6 g/dL (ref 32.0–36.0)
MCV: 91.2 fL (ref 80.0–100.0)
PLATELETS: 216 10*3/uL (ref 150–440)
RBC: 4.06 MIL/uL (ref 3.80–5.20)
RDW: 14 % (ref 11.5–14.5)
WBC: 6.5 10*3/uL (ref 3.6–11.0)

## 2016-07-28 LAB — CREATININE, SERUM
CREATININE: 0.58 mg/dL (ref 0.44–1.00)
GFR calc Af Amer: >60 mL/min (ref >60)
GFR calc non Af Amer: >60 mL/min (ref >60)

## 2016-07-28 MED ORDER — TRAMADOL HCL 50 MG PO TABS: 50.0000 mg | ORAL_TABLET | Freq: Four times a day (QID) | ORAL | 0 refills | Status: DC | PRN

## 2016-07-28 MED ORDER — LORAZEPAM 2 MG/ML IJ SOLN
1.0000 mg | Freq: Once | INTRAMUSCULAR | Status: AC
  Administered 2016-07-28: 1 mg via INTRAMUSCULAR

## 2016-07-28 MED ORDER — LORAZEPAM 2 MG/ML IJ SOLN
INTRAMUSCULAR | Status: AC
  Administered 2016-07-28: 1 mg via INTRAMUSCULAR
  Filled 2016-07-28: qty 1

## 2016-07-28 NOTE — Progress Notes (Signed)
Pt d/c home; d/c instructions reviewed w/ pt and pt daughter; pt and pt daughter understanding was verbalized; IV removed by pt prior to this RN's shift; all pt and pt daughter questions answered; pt and pt daughter verbalized that all pt belongings were accounted for; pt left unit via wheelchair accompanied by staff

## 2016-07-28 NOTE — Progress Notes (Signed)
After patient had VS taken this am she had become agitated . Pt pulled out IV, got dressed and walked into hall agitated. Additional staff needed to safely assist pt back to room. MD notified. Order obtained. Medication given. Staff stayed at bedside for safety.

## 2016-07-28 NOTE — Discharge Instructions (Signed)
Follow all MD discharge instructions. Take all medications as prescribed. Keep all follow up appointments. If your symptoms return, call your doctor. If you experience any new symptoms that are of concern to you or that are bothersome to you, call your doctor. For all questions and/or concerns, call your doctor. ° °If you experience any bleeding or discharge from your incision sites, redness or swelling at your incision sites, fever, pain uncontrolled by medication, increase in pain, or any nausea, call your doctor.  ° ° If you have a medical emergency, call [phone] °

## 2016-07-28 NOTE — Progress Notes (Signed)
Staff at bedside for safety. Daughter arrived and at bedside. ELQ

## 2016-07-28 NOTE — Progress Notes (Signed)
Staff at bedside for safety. Patient complained of right knee pain. Assessed right knee. No visible redness or bruising. Offered pain medication. Patient declined. Patient lying in bed with knees bent and flexing feet without evidence of pain and discomfort. ELQ

## 2016-07-28 NOTE — Progress Notes (Signed)
Patient remains agitated. Staff at bedside for safety. Daughter was notified and is on the way to hospital.

## 2016-07-28 NOTE — Discharge Summary (Signed)
[facility]    Discharge Summary    Patient ID:  Ashley Hill MRN: [id number] DOB/AGE: 19-Sep-1934 82 y.o.  Admit date: 07/25/2016 Discharge date: 07/28/2016 Date of Surgery: 07/25/2016 - 07/26/2016 Surgeon: Moishe SpiceSurgeon(s): Annice NeedyJason S Dew, MD  Admission Diagnosis: Peripheral artery occlusion [facility](HCC) [I77.9] Claudication in peripheral vascular disease [facility](HCC) [I73.9]  Discharge Diagnoses:  Peripheral artery occlusion (HCC) [I77.9] Claudication in peripheral vascular disease (HCC) [I73.9]  Secondary Diagnoses: Past Medical History:  Diagnosis Date  . Dyslipidemia   . Ectopic atrial tachycardia (HCC)   . Environmental allergies   . H/O scarlet fever   . Heart murmur   . HOH (hard of hearing)   . Hyperlipidemia   . Hypertension   . Peripheral vascular disease (HCC)   . 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 [place]  . Transient cerebral ischemia     Procedure(s): Peripheral Vascular Balloon Angioplasty  Discharged Condition: good  HPI:  Patient presented with rest pain of the right leg.  Multiple previous interventions  Hospital Course:  Ashley Hill is a 82 y.o. female is S/P Right Procedure(s): Peripheral Vascular Balloon Angioplasty Extubated: NA Physical exam: foot warm, good cap refill Post-op wounds clean, dry, intact or healing well Pt. Ambulating, voiding and taking PO diet without difficulty. Pt pain controlled with PO pain meds. Labs as below Complications:none  Consults:  Treatment Team:  Audery AmelJohn T Clapacs, MD  Significant Diagnostic Studies: CBC Lab Results  Component Value Date   WBC 6.5 07/28/2016   HGB 12.8 07/28/2016   HCT 37.0 07/28/2016   MCV 91.2 07/28/2016   PLT 216 07/28/2016    BMET    Component Value Date/Time   NA 139 07/26/2016 0642   NA 142 04/29/2013 1023   K 4.0 07/26/2016 0642   CL 102 07/26/2016 0642   CO2 30 07/26/2016 0642   GLUCOSE 106 (H) 07/26/2016 0642   BUN 18 07/26/2016 0642   BUN 22 04/29/2013 1023   CREATININE 0.58 07/28/2016 0431   CALCIUM 9.3 07/26/2016 0642   GFRNONAA >60 07/28/2016 0431   GFRAA >60 07/28/2016 0431   COAG Lab Results  Component Value Date   INR 0.99 07/25/2016   INR 1.0 12/09/2013     Disposition:  Discharge to :Home Discharge Instructions    Call MD for:  redness, tenderness, or signs of infection (pain, swelling, bleeding, redness, odor or green/yellow discharge around incision site)    Complete by:  As directed    Call MD for:  severe or increased pain, loss or decreased feeling  in affected limb(s)    Complete by:  As directed    Call MD for:  temperature >100.5    Complete by:  As directed    No dressing needed    Complete by:  As directed    Replace only if drainage present   Resume previous diet    Complete by:  As directed      Allergies as of 07/28/2016      Reactions   Penicillins    Has patient had a PCN reaction causing immediate rash, facial/tongue/throat swelling, SOB or lightheadedness with hypotension: Yes Has patient had a PCN reaction causing severe rash involving mucus membranes or skin necrosis: No Has patient had a PCN reaction that required hospitalization No Has patient had a PCN reaction occurring within the last 10 years: No If all of the above answers  are "NO", then may proceed with Cephalosporin use.   Statins    Lipitor [atorvastatin]       Medication List    STOP taking these medications   HYDROcodone-acetaminophen 5-325 MG tablet Commonly known as:  NORCO     TAKE these medications   amLODipine 5 MG tablet Commonly known as:  NORVASC Take 5 mg by mouth daily.   apixaban 5 MG Tabs tablet Commonly known as:  ELIQUIS Take 1 tablet (5 mg total) by mouth 2 (two) times daily.   aspirin 81 MG tablet Take 81 mg by mouth daily.   b complex vitamins tablet Take 1 tablet by mouth daily.   CALCIUM PO Take 750 mg by mouth 2  (two) times daily.   carvedilol 25 MG tablet Commonly known as:  COREG Take 25 mg by mouth 2 (two) times daily with a meal.   cholecalciferol 1000 units tablet Commonly known as:  VITAMIN D Take 2,000 Units by mouth daily.   CoQ10 50 MG Caps Take 1 capsule by mouth daily.   CRESTOR 5 MG tablet Generic drug:  rosuvastatin TAKE 1 TABLET BY MOUTH EVERY DAY   hydrochlorothiazide 25 MG tablet Commonly known as:  HYDRODIURIL TAKE 1 TABLET BY MOUTH EVERY DAY   traMADol 50 MG tablet Commonly known as:  ULTRAM Take 1 tablet (50 mg total) by mouth every 6 (six) hours as needed.   Turmeric 450 MG Caps Take 450 mg by mouth daily.      Verbal and written Discharge instructions given to the patient. Wound care per Discharge AVS Follow-up Information    Festus Barren, MD Follow up in 3 week(s).   Specialties:  Vascular Surgery, Radiology, Interventional Cardiology Why:  with ABIs Contact information: [address] [place] [address] [phone]           Signed: Festus Barren, MD  07/28/2016, 8:39 AM

## 2016-08-01 DIAGNOSIS — G8929 Other chronic pain: Secondary | ICD-10-CM | POA: Diagnosis not present

## 2016-08-01 DIAGNOSIS — M25511 Pain in right shoulder: Secondary | ICD-10-CM | POA: Diagnosis not present

## 2016-08-03 ENCOUNTER — Encounter (INDEPENDENT_AMBULATORY_CARE_PROVIDER_SITE_OTHER): Payer: Self-pay | Admitting: Vascular Surgery

## 2016-08-03 ENCOUNTER — Ambulatory Visit (INDEPENDENT_AMBULATORY_CARE_PROVIDER_SITE_OTHER): Payer: Medicare Other | Admitting: Vascular Surgery

## 2016-08-03 ENCOUNTER — Other Ambulatory Visit (INDEPENDENT_AMBULATORY_CARE_PROVIDER_SITE_OTHER): Payer: Self-pay | Admitting: Vascular Surgery

## 2016-08-03 ENCOUNTER — Encounter (INDEPENDENT_AMBULATORY_CARE_PROVIDER_SITE_OTHER): Payer: Self-pay

## 2016-08-03 ENCOUNTER — Other Ambulatory Visit (INDEPENDENT_AMBULATORY_CARE_PROVIDER_SITE_OTHER): Payer: Medicare Other

## 2016-08-03 VITALS — BP 212/104 | HR 98 | Resp 16 | Ht 63.5 in | Wt 141.0 lb

## 2016-08-03 DIAGNOSIS — M79606 Pain in leg, unspecified: Secondary | ICD-10-CM

## 2016-08-03 DIAGNOSIS — M79604 Pain in right leg: Secondary | ICD-10-CM | POA: Diagnosis not present

## 2016-08-03 DIAGNOSIS — E785 Hyperlipidemia, unspecified: Secondary | ICD-10-CM

## 2016-08-03 DIAGNOSIS — I6523 Occlusion and stenosis of bilateral carotid arteries: Secondary | ICD-10-CM

## 2016-08-03 DIAGNOSIS — F0280 Dementia in other diseases classified elsewhere without behavioral disturbance: Secondary | ICD-10-CM

## 2016-08-03 DIAGNOSIS — G309 Alzheimer's disease, unspecified: Secondary | ICD-10-CM | POA: Diagnosis not present

## 2016-08-03 DIAGNOSIS — I739 Peripheral vascular disease, unspecified: Secondary | ICD-10-CM | POA: Diagnosis not present

## 2016-08-04 ENCOUNTER — Encounter
Admission: RE | Admit: 2016-08-04 | Discharge: 2016-08-04 | Disposition: A | Discharge status: Home / Self Care | Payer: Medicare Other | Source: Ambulatory Visit | Attending: Vascular Surgery | Admitting: Vascular Surgery

## 2016-08-04 DIAGNOSIS — Z01818 Encounter for other preprocedural examination: Secondary | ICD-10-CM

## 2016-08-04 DIAGNOSIS — I10 Essential (primary) hypertension: Secondary | ICD-10-CM

## 2016-08-04 DIAGNOSIS — I498 Other specified cardiac arrhythmias: Secondary | ICD-10-CM | POA: Insufficient documentation

## 2016-08-04 DIAGNOSIS — M79604 Pain in right leg: Secondary | ICD-10-CM

## 2016-08-04 DIAGNOSIS — E785 Hyperlipidemia, unspecified: Secondary | ICD-10-CM

## 2016-08-04 LAB — CBC WITH DIFFERENTIAL/PLATELET
Basophils Absolute: 0 10*3/uL (ref 0–0.1)
Basophils Relative: 0 %
Eosinophils Absolute: 0.1 10*3/uL (ref 0–0.7)
Eosinophils Relative: 1 %
HCT: 41 % (ref 35.0–47.0)
Hemoglobin: 14.1 g/dL (ref 12.0–16.0)
Lymphocytes Relative: 19 %
Lymphs Abs: 1.8 10*3/uL (ref 1.0–3.6)
MCH: 31.4 pg (ref 26.0–34.0)
MCHC: 34.5 g/dL (ref 32.0–36.0)
MCV: 91.2 fL (ref 80.0–100.0)
MONO ABS: 0.7 10*3/uL (ref 0.2–0.9)
MONOS PCT: 7 %
Neutro Abs: 6.7 10*3/uL — ABNORMAL HIGH (ref 1.4–6.5)
Neutrophils Relative %: 73 %
Platelets: 309 10*3/uL (ref 150–440)
RBC: 4.49 MIL/uL (ref 3.80–5.20)
RDW: 13.5 % (ref 11.5–14.5)
WBC: 9.3 10*3/uL (ref 3.6–11.0)

## 2016-08-04 LAB — SURGICAL PCR SCREEN
MRSA, PCR: NEGATIVE
Staphylococcus aureus: NEGATIVE

## 2016-08-04 LAB — BASIC METABOLIC PANEL
Anion gap: 10 (ref 5–15)
BUN: 22 mg/dL — ABNORMAL HIGH (ref 6–20)
CALCIUM: 10 mg/dL (ref 8.9–10.3)
CO2: 30 mmol/L (ref 22–32)
CREATININE: 0.58 mg/dL (ref 0.44–1.00)
Chloride: 95 mmol/L — ABNORMAL LOW (ref 101–111)
GFR calc Af Amer: >60 mL/min (ref >60)
GLUCOSE: 124 mg/dL — AB (ref 65–99)
Potassium: 4 mmol/L (ref 3.5–5.1)
Sodium: 135 mmol/L (ref 135–145)

## 2016-08-04 LAB — TYPE AND SCREEN
ABO/RH(D): O POS
Antibody Screen: NEGATIVE

## 2016-08-04 LAB — PROTIME-INR
INR: 1.06
PROTHROMBIN TIME: 13.8 s (ref 11.4–15.2)

## 2016-08-04 LAB — APTT: APTT: 32 s (ref 24–36)

## 2016-08-04 NOTE — Patient Instructions (Signed)
Your procedure is scheduled on: Monday 08/07/16 Report to DAY SURGERY. 2ND FLOOR MEDICAL MALL ENTRANCE. To find out your arrival time please call [phone] between 1PM - 3PM on TODAY.  Remember: Instructions that are not followed completely may result in serious medical risk, up to and including death, or upon the discretion of your surgeon and anesthesiologist your surgery may need to be rescheduled.    __X__ 1. Do not eat food or drink liquids after midnight. No gum chewing or hard candies.     __X__ 2. No Alcohol for 24 hours before or after surgery.   ____ 3. Bring all medications with you on the day of surgery if instructed.    __X__ 4. Notify your doctor if there is any change in your medical condition     (cold, fever, infections).             __X___5. No smoking within 24 hours of your surgery.     Do not wear jewelry, make-up, hairpins, clips or nail polish.  Do not wear lotions, powders, or perfumes.   Do not shave 48 hours prior to surgery. Men may shave face and neck.  Do not bring valuables to the hospital.    [facility] not responsible for any belongings or valuables.               Contacts, dentures or bridgework may not be worn into surgery.  Leave your suitcase in the car. After surgery it may be brought to your room.  For patients admitted to the hospital, discharge time is determined by your                treatment team.   Patients discharged the day of surgery will not be allowed to drive home.   Please read over the following fact sheets that you were given:   Pain Booklet and MRSA Information   __X__ Take these medicines the morning of surgery with A SIP OF WATER:    1. AMLODIPINE  2. CARVEDILOL  3.   4.  5.  6.  ____ Fleet Enema (as directed)   __X__ Use CHG Soap as directed  ____ Use inhalers on the day of surgery  ____ Stop metformin 2 days prior to surgery    ____ Take 1/2 of usual insulin dose the night before surgery and none on the  morning of surgery.   __X__ Stop Coumadin/Plavix/aspirin on (NO ELOQUIS Sunday OR Monday, CONTINUE ASPIRIN DO NOT TAKE ON Monday)  __X__ Stop Anti-inflammatories such as Advil, Aleve, Ibuprofen, Motrin, Naproxen, Naprosyn, Goodies,powder, or aspirin products.  OK to take Tylenol.   __X__ Stop supplements until after surgery.    ____ Bring C-Pap to the hospital.

## 2016-08-06 IMAGING — US US EXTREM LOW VENOUS*R*
1 series · 13 of 24 positions shown · non-contrast
Comparison: None.

CLINICAL DATA: Postop extremity pain in right calf. Right femoral
artery stent placed on 06/21/2015, through the left groin.



[Series 1: us extrem low venous*right* · 0.07mm/px · 13 of 36 slices shown]
[im 1/36]
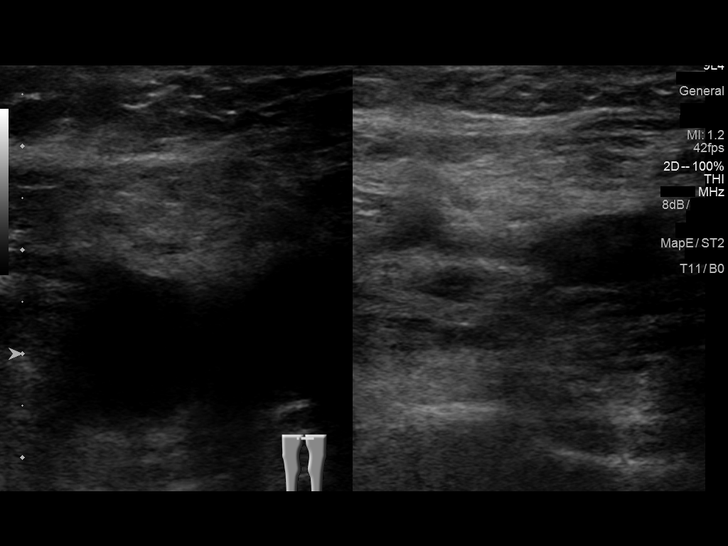
[im 4/36]
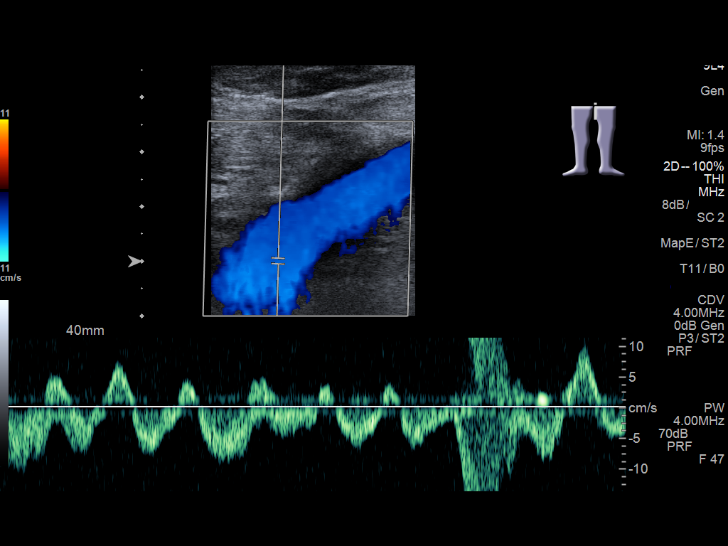
[im 7/36]
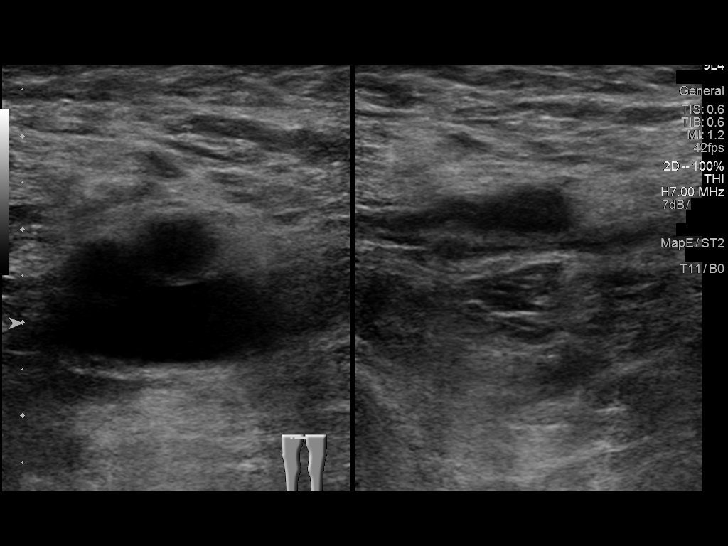
[im 10/36]
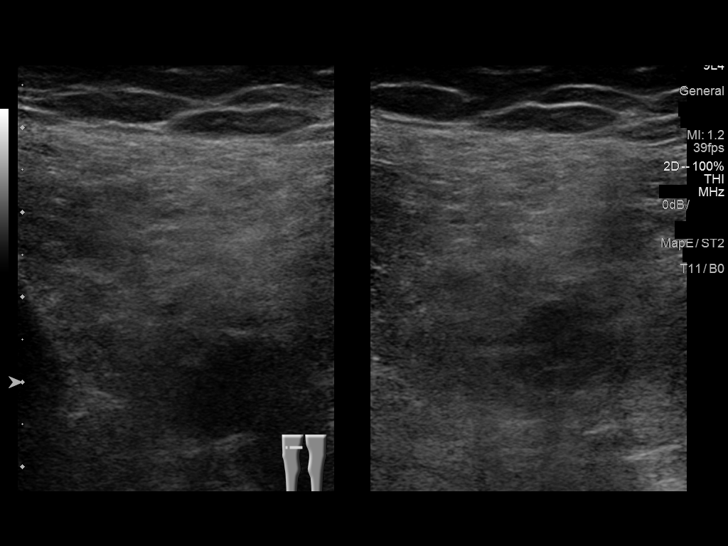
[im 13/36]
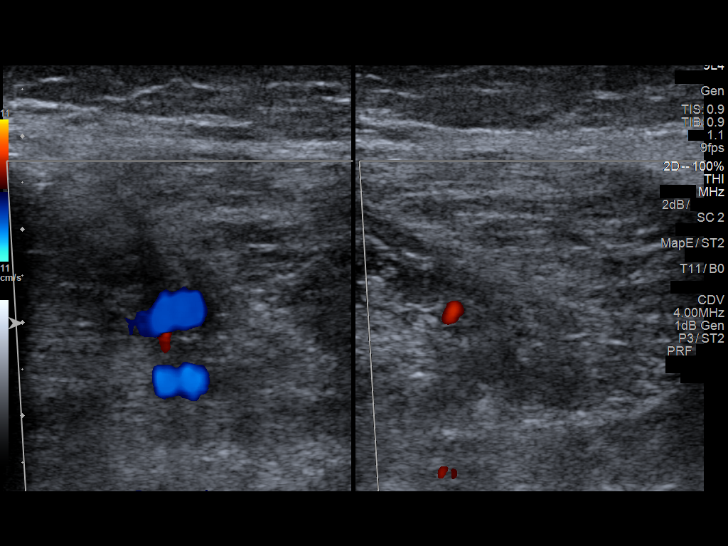
[im 16/36]
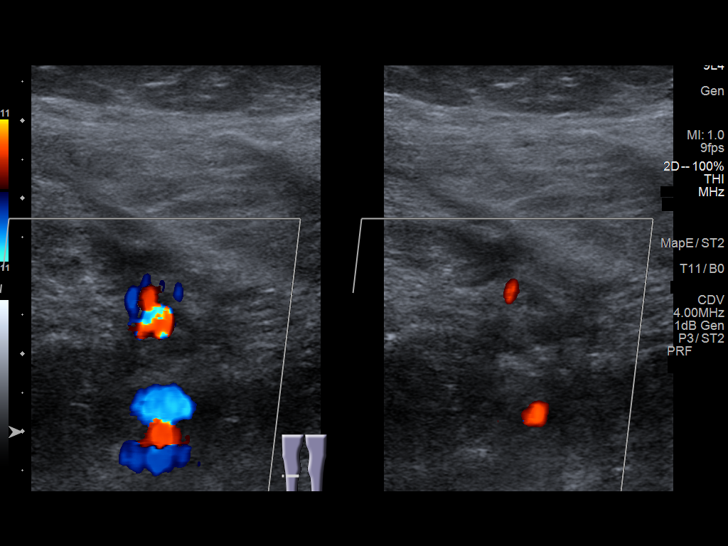
[im 19/36]
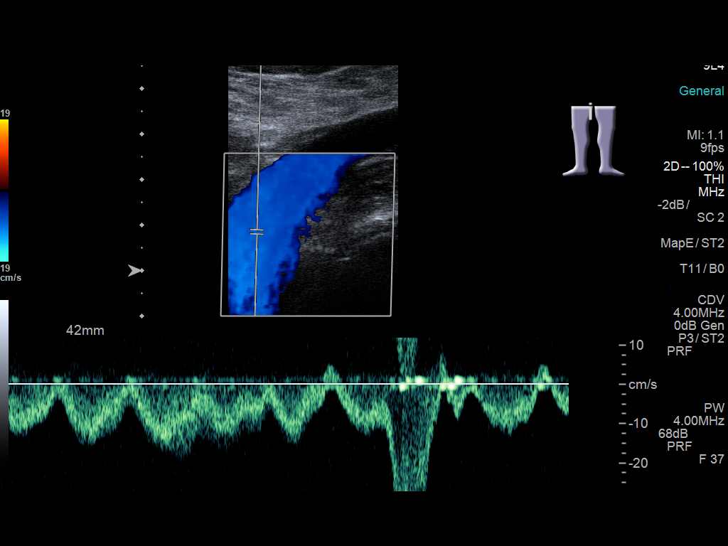
[im 20/36]
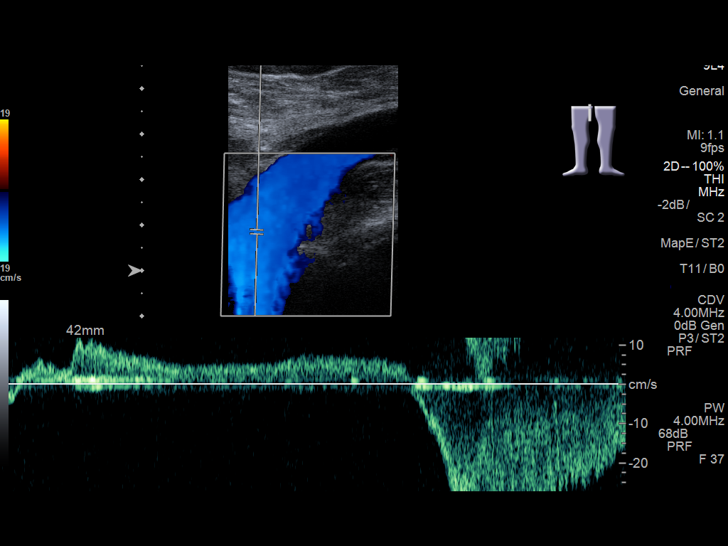
[im 23/36]
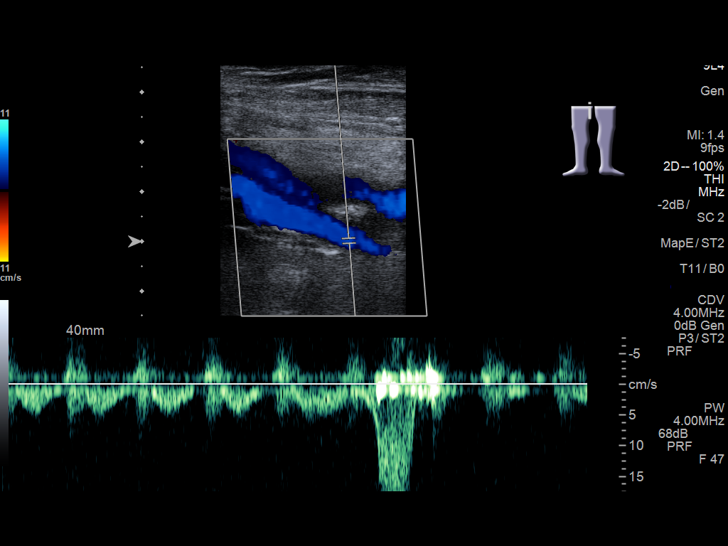
[im 26/36]
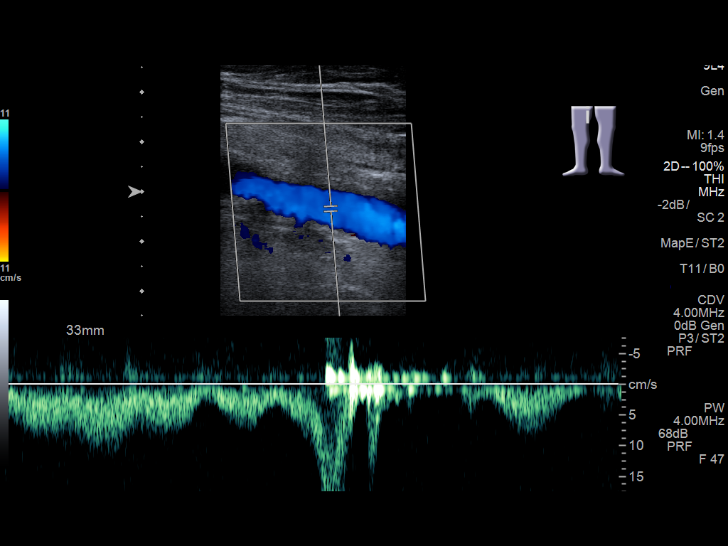
[im 29/36]
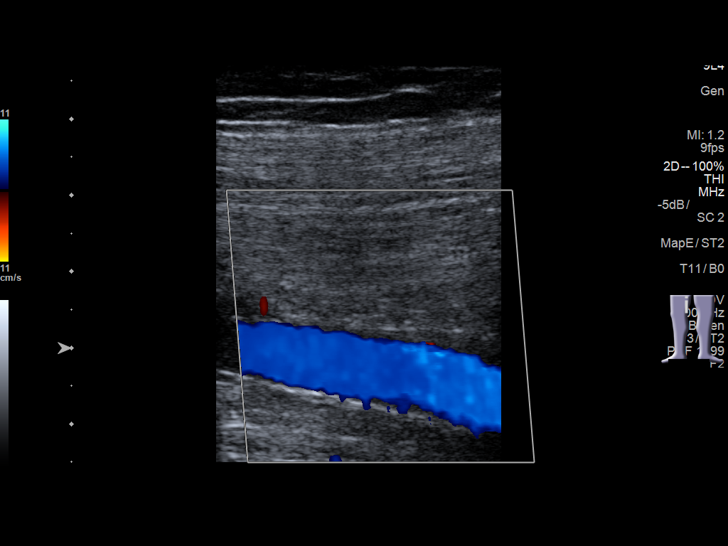
[im 32/36]
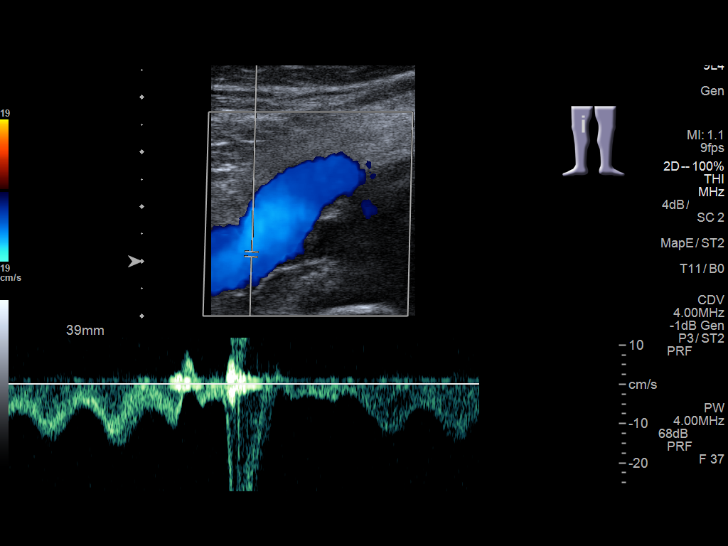
[im 36/36]
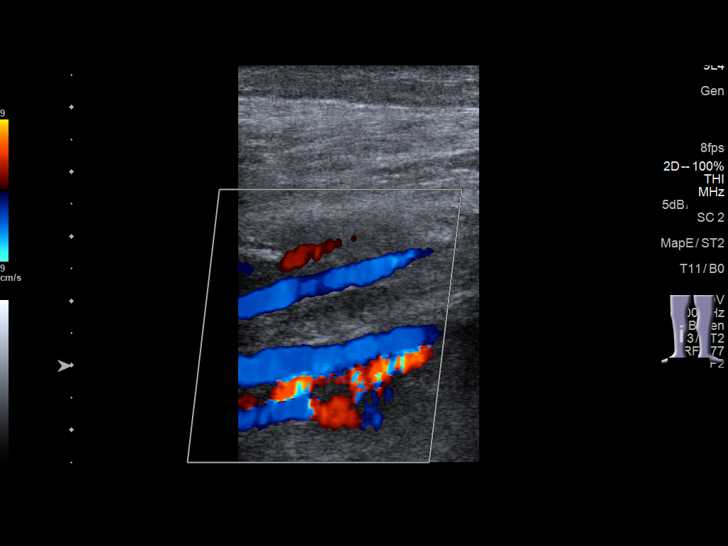

[13 of 24 positions shown; findings below may reference images not displayed]

FINDINGS: Contralateral Common Femoral Vein: Respiratory phasicity is normal
and symmetric with the symptomatic side. No evidence of thrombus.
Normal compressibility.

Common Femoral Vein: No evidence of thrombus. Normal
compressibility, respiratory phasicity and response to augmentation.

Saphenofemoral Junction: No evidence of thrombus. Normal
compressibility and flow on color Doppler imaging.

Profunda Femoral Vein: No evidence of thrombus. Normal
compressibility and flow on color Doppler imaging.

Femoral Vein: No evidence of thrombus. Normal compressibility,
respiratory phasicity and response to augmentation.

Popliteal Vein: No evidence of thrombus. Normal compressibility,
respiratory phasicity and response to augmentation.

Calf Veins: No evidence of thrombus. Normal compressibility and flow
on color Doppler imaging.

Superficial Great Saphenous Vein: No evidence of thrombus. Normal
compressibility and flow on color Doppler imaging.

Venous Reflux:  None.

Other Findings:  None.
IMPRESSION: No evidence of deep venous thrombosis.

## 2016-08-06 MED ORDER — CLINDAMYCIN PHOSPHATE 300 MG/50ML IV SOLN
300.0000 mg | Freq: Once | INTRAVENOUS | Status: AC
  Administered 2016-08-07: 300 mg via INTRAVENOUS

## 2016-08-06 NOTE — Progress Notes (Signed)
Subjective:    Patient ID: Ashley Hill, female    DOB: Sep 30, 1934, 82 y.o.   MRN: [id number] Chief Complaint  Patient presents with  . Re-evaluation    ABI   Patient presents sooner than her scheduled appointment complaining of right lower extremity pain with bluish tint to her right big toe. The patient has had multiple lower extremity interventions most recent on 07/26/16. Patient presents with daughter. Both are concerned about her increasing right lower extremity pain and bluish tint to her right big toe. Patient denies any fever, nausea or vomiting. Denies any rest pain or ulceration to her lower extremity. ABI today shows severe right lower extremity pain - this is stable from previous exam. Patient is asking if it would be safe to fly to [place] and come back before her surgery.    Review of Systems  Constitutional: Negative.   HENT: Negative.   Eyes: Negative.   Respiratory: Negative.   Cardiovascular:       Right Lower Extremity Pain  Gastrointestinal: Negative.   Endocrine: Negative.   Genitourinary: Negative.   Musculoskeletal: Negative.   Skin: Negative.   Allergic/Immunologic: Negative.   Neurological: Negative.   Hematological: Negative.   Psychiatric/Behavioral: Negative.       Objective:   Physical Exam  Constitutional: She is oriented to person, place, and time. She appears well-developed and well-nourished.  HENT:  Head: Normocephalic and atraumatic.  Right Ear: External ear normal.  Left Ear: External ear normal.  Neck: Normal range of motion.  Cardiovascular: Normal rate, regular rhythm and normal heart sounds.   Pulses:      Radial pulses are 2+ on the right side, and 2+ on the left side.       Dorsalis pedis pulses are 0 on the right side, and 2+ on the left side.       Posterior tibial pulses are 0 on the right side, and 2+ on the left side.  Unable to appreciate right pedal pulses. Bluish tint to right big toe. No ulcerations noted.     Pulmonary/Chest: Effort normal.  Musculoskeletal: Normal range of motion. She exhibits no edema.  Neurological: She is alert and oriented to person, place, and time.  Skin: Skin is warm and dry.  Psychiatric: She has a normal mood and affect. Her behavior is normal. Judgment and thought content normal.   BP (!) 212/104 (BP Location: Right Arm)   Pulse 98   Resp 16   Ht 5' 3.5" (1.613 m)   Wt 64 kg (141 lb)   BMI 24.59 kg/m   Past Medical History:  Diagnosis Date  . Dyslipidemia   . Ectopic atrial tachycardia (HCC)   . Environmental allergies   . H/O scarlet fever   . Heart murmur   . HOH (hard of hearing)   . Hyperlipidemia   . Hypertension   . Peripheral vascular disease (HCC)   . 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 Harvie Bridge, [place].  . Transient cerebral ischemia    Social History   Social History  . Marital status: Divorced    Spouse name: N/A  . Number of children: N/A  . Years of education: N/A   Occupational History  . Not on file.   Social History Main Topics  . Smoking status: Former Smoker    Packs/day: 1.00    Years: 10.00    Types: Cigarettes    Quit date:  02/23/1981  . Smokeless tobacco: Never Used  . Alcohol use No     Comment: rare occ  . Drug use: No  . Sexual activity: No   Other Topics Concern  . Not on file   Social History Narrative   Does not have a living will.    Wants daughter to be HPOA.   Does want CPR but no prolonged life support.   Past Surgical History:  Procedure Laterality Date  . ABLATION OF DYSRHYTHMIC FOCUS    . AUGMENTATION MAMMAPLASTY Bilateral   . JOINT REPLACEMENT     knee  . KNEE ARTHROSCOPY W/ MENISCAL REPAIR     Right   . PERIPHERAL VASCULAR BALLOON ANGIOPLASTY N/A 07/26/2016   Procedure: Peripheral Vascular Balloon Angioplasty;  Surgeon: Annice Needy, MD;  Location: [facility];  Service: Cardiovascular;  Laterality: N/A;  .  PERIPHERAL VASCULAR CATHETERIZATION Right 01/11/2015   Procedure: Lower Extremity Angiography;  Surgeon: Annice Needy, MD;  Location: [facility];  Service: Cardiovascular;  Laterality: Right;  . PERIPHERAL VASCULAR CATHETERIZATION Right 06/21/2015   Procedure: Lower Extremity Angiography;  Surgeon: Annice Needy, MD;  Location: [facility];  Service: Cardiovascular;  Laterality: Right;  . PERIPHERAL VASCULAR CATHETERIZATION Right 06/21/2015   Procedure: Lower Extremity Intervention;  Surgeon: Annice Needy, MD;  Location: [facility];  Service: Cardiovascular;  Laterality: Right;  . PERIPHERAL VASCULAR CATHETERIZATION Right 10/12/2015   Procedure: Lower Extremity Angiography;  Surgeon: Annice Needy, MD;  Location: [facility];  Service: Cardiovascular;  Laterality: Right;  . PERIPHERAL VASCULAR CATHETERIZATION Right 10/12/2015   Procedure: Lower Extremity Intervention;  Surgeon: Annice Needy, MD;  Location: [facility];  Service: Cardiovascular;  Laterality: Right;  . PERIPHERAL VASCULAR CATHETERIZATION Left 02/10/2016   Procedure: Lower Extremity Angiography;  Surgeon: Annice Needy, MD;  Location: [facility];  Service: Cardiovascular;  Laterality: Left;  . PERIPHERAL VASCULAR CATHETERIZATION  02/10/2016   Procedure: Lower Extremity Intervention;  Surgeon: Annice Needy, MD;  Location: [facility];  Service: Cardiovascular;;  . PERIPHERAL VASCULAR CATHETERIZATION Right 02/24/2016   Procedure: Lower Extremity Angiography;  Surgeon: Annice Needy, MD;  Location: [facility];  Service: Cardiovascular;  Laterality: Right;  . SHOULDER CLOSED REDUCTION Right 04/10/2016   Procedure: CLOSED REDUCTION SHOULDER;  Surgeon: Kennedy Bucker, MD;  Location: [facility];  Service: Orthopedics;  Laterality: Right;  . TONSILLECTOMY    . TOTAL KNEE ARTHROPLASTY  2015  . VAGINAL HYSTERECTOMY     Family History  Problem Relation Age of Onset  . Alcohol abuse Father    . Cancer Father   . Diabetes Paternal Grandfather   . Breast cancer Neg Hx    Allergies  Allergen Reactions  . Penicillins Anaphylaxis    Has patient had a PCN reaction causing immediate rash, facial/tongue/throat swelling, SOB or lightheadedness with hypotension: Yes Has patient had a PCN reaction causing severe rash involving mucus membranes or skin necrosis: No Has patient had a PCN reaction that required hospitalization No Has patient had a PCN reaction occurring within the last 10 years: No If all of the above answers are "NO", then may proceed with Cephalosporin use.   . Statins     Cramps in legs  . Lipitor [Atorvastatin]       Assessment & Plan:   1. Hyperlipidemia, unspecified hyperlipidemia type - Stable Encouraged good control as its slows the progression of  atherosclerotic disease.  2. Pain of right lower extremity - Worsening Patient with worsening RLE. No pedal pulses on exam with severe disease on ABI. Strong PMHx of PAD with multiple endovascular interventions. Recommend right lower extremity femoral endarerectomy. Procedure, risks and benefits explained to patient. All questions answered. Patient is willing to proceed. Ok if patient wants to fly and be back for surgery.  3. Dementia in Alzheimer's disease - Stable Patient was alert and coherent during exam.   Current Outpatient Prescriptions on File Prior to Visit  Medication Sig Dispense Refill  . amLODipine (NORVASC) 5 MG tablet Take 5 mg by mouth daily.    Marland Kitchen. apixaban (ELIQUIS) 5 MG TABS tablet Take 1 tablet (5 mg total) by mouth 2 (two) times daily. 60 tablet 2  . aspirin 81 MG tablet Take 81 mg by mouth daily.    Marland Kitchen. b complex vitamins tablet Take 1 tablet by mouth daily.    Marland Kitchen. CALCIUM PO Take 750 mg by mouth 2 (two) times daily.    . carvedilol (COREG) 25 MG tablet Take 25 mg by mouth 2 (two) times daily with a meal.    . cholecalciferol (VITAMIN D) 1000 units tablet Take 2,000 Units by mouth daily.      . Coenzyme Q10 (COQ10) 50 MG CAPS Take 1 capsule by mouth daily.    . CRESTOR 5 MG tablet TAKE 1 TABLET BY MOUTH EVERY DAY 30 tablet 6  . hydrochlorothiazide (HYDRODIURIL) 25 MG tablet TAKE 1 TABLET BY MOUTH EVERY DAY 30 tablet 3  . traMADol (ULTRAM) 50 MG tablet Take 1 tablet (50 mg total) by mouth every 6 (six) hours as needed. 30 tablet 0  . Turmeric 450 MG CAPS Take 450 mg by mouth daily.     No current facility-administered medications on file prior to visit.     There are no Patient Instructions on file for this visit. No Follow-up on file.   Sanjna Haskew A Kailynne Ferrington, PA-C

## 2016-08-07 ENCOUNTER — Inpatient Hospital Stay: Payer: Medicare Other | Admitting: Anesthesiology

## 2016-08-07 ENCOUNTER — Inpatient Hospital Stay
Admission: RE | Admit: 2016-08-07 | Discharge: 2016-08-08 | DRG: 254 | Disposition: A | Discharge status: Home Health | Payer: Medicare Other | Source: Ambulatory Visit | Attending: Vascular Surgery | Admitting: Vascular Surgery

## 2016-08-07 ENCOUNTER — Encounter
Admission: RE | Disposition: A | Discharge status: Home Health | Payer: Self-pay | Source: Ambulatory Visit | Attending: Vascular Surgery

## 2016-08-07 ENCOUNTER — Encounter: Payer: Self-pay | Admitting: *Deleted

## 2016-08-07 DIAGNOSIS — I70291 Other atherosclerosis of native arteries of extremities, right leg: Secondary | ICD-10-CM | POA: Diagnosis not present

## 2016-08-07 DIAGNOSIS — Z87892 Personal history of anaphylaxis: Secondary | ICD-10-CM

## 2016-08-07 DIAGNOSIS — Z96653 Presence of artificial knee joint, bilateral: Secondary | ICD-10-CM | POA: Diagnosis present

## 2016-08-07 DIAGNOSIS — Z87891 Personal history of nicotine dependence: Secondary | ICD-10-CM

## 2016-08-07 DIAGNOSIS — I70229 Atherosclerosis of native arteries of extremities with rest pain, unspecified extremity: Secondary | ICD-10-CM | POA: Diagnosis present

## 2016-08-07 DIAGNOSIS — Z7901 Long term (current) use of anticoagulants: Secondary | ICD-10-CM | POA: Diagnosis not present

## 2016-08-07 DIAGNOSIS — M79609 Pain in unspecified limb: Secondary | ICD-10-CM | POA: Diagnosis not present

## 2016-08-07 DIAGNOSIS — M79604 Pain in right leg: Secondary | ICD-10-CM | POA: Diagnosis not present

## 2016-08-07 DIAGNOSIS — F0280 Dementia in other diseases classified elsewhere without behavioral disturbance: Secondary | ICD-10-CM | POA: Diagnosis present

## 2016-08-07 DIAGNOSIS — Z888 Allergy status to other drugs, medicaments and biological substances status: Secondary | ICD-10-CM | POA: Diagnosis not present

## 2016-08-07 DIAGNOSIS — Z9862 Peripheral vascular angioplasty status: Secondary | ICD-10-CM | POA: Diagnosis not present

## 2016-08-07 DIAGNOSIS — E785 Hyperlipidemia, unspecified: Secondary | ICD-10-CM | POA: Diagnosis present

## 2016-08-07 DIAGNOSIS — G309 Alzheimer's disease, unspecified: Secondary | ICD-10-CM | POA: Diagnosis present

## 2016-08-07 DIAGNOSIS — Z7982 Long term (current) use of aspirin: Secondary | ICD-10-CM

## 2016-08-07 DIAGNOSIS — I70221 Atherosclerosis of native arteries of extremities with rest pain, right leg: Secondary | ICD-10-CM | POA: Diagnosis not present

## 2016-08-07 DIAGNOSIS — Z880 Allergy status to penicillin: Secondary | ICD-10-CM | POA: Diagnosis not present

## 2016-08-07 DIAGNOSIS — I10 Essential (primary) hypertension: Secondary | ICD-10-CM | POA: Diagnosis present

## 2016-08-07 DIAGNOSIS — I739 Peripheral vascular disease, unspecified: Secondary | ICD-10-CM | POA: Diagnosis not present

## 2016-08-07 HISTORY — PX: ENDARTERECTOMY FEMORAL: SHX5804

## 2016-08-07 SURGERY — ENDARTERECTOMY, FEMORAL
Anesthesia: General | Laterality: Right | Wound class: Clean

## 2016-08-07 MED ORDER — FAMOTIDINE IN NACL 20-0.9 MG/50ML-% IV SOLN
20.0000 mg | Freq: Two times a day (BID) | INTRAVENOUS | Status: DC
  Administered 2016-08-07 (×2): 20 mg via INTRAVENOUS
  Filled 2016-08-07 (×4): qty 50

## 2016-08-07 MED ORDER — SODIUM CHLORIDE 0.9 % IV SOLN
0.2000 ug/kg/h | INTRAVENOUS | Status: DC
  Filled 2016-08-07: qty 2

## 2016-08-07 MED ORDER — HEPARIN SODIUM (PORCINE) 5000 UNIT/ML IJ SOLN
INTRAMUSCULAR | Status: AC
  Filled 2016-08-07: qty 1

## 2016-08-07 MED ORDER — FENTANYL CITRATE (PF) 100 MCG/2ML IJ SOLN
INTRAMUSCULAR | Status: AC
  Administered 2016-08-07: 25 ug via INTRAVENOUS
  Filled 2016-08-07: qty 2

## 2016-08-07 MED ORDER — CALCIUM CARBONATE ANTACID 500 MG PO CHEW
1.5000 | CHEWABLE_TABLET | Freq: Two times a day (BID) | ORAL | Status: DC
  Administered 2016-08-07: 300 mg via ORAL
  Filled 2016-08-07 (×3): qty 1

## 2016-08-07 MED ORDER — SODIUM CHLORIDE FLUSH 0.9 % IV SOLN
INTRAVENOUS | Status: AC
  Administered 2016-08-07: 5 mL via INTRAVENOUS
  Filled 2016-08-07: qty 10

## 2016-08-07 MED ORDER — ALUM & MAG HYDROXIDE-SIMETH 200-200-20 MG/5ML PO SUSP: 15.0000 mL | ORAL | Status: DC | PRN

## 2016-08-07 MED ORDER — EVICEL 2 ML EX KIT
PACK | CUTANEOUS | Status: DC | PRN
  Administered 2016-08-07: 2 mL

## 2016-08-07 MED ORDER — HEPARIN SOD (PORK) LOCK FLUSH 10 UNIT/ML IV SOLN
INTRAVENOUS | Status: DC | PRN
Start: 2016-08-07 — End: 2016-08-07
  Administered 2016-08-07: 60 mL

## 2016-08-07 MED ORDER — LACTATED RINGERS IV SOLN
INTRAVENOUS | Status: DC
  Administered 2016-08-07 (×2): via INTRAVENOUS

## 2016-08-07 MED ORDER — CLINDAMYCIN PHOSPHATE 300 MG/50ML IV SOLN
INTRAVENOUS | Status: AC
  Filled 2016-08-07: qty 50

## 2016-08-07 MED ORDER — CLINDAMYCIN PHOSPHATE 300 MG/50ML IV SOLN
300.0000 mg | Freq: Three times a day (TID) | INTRAVENOUS | Status: AC
  Administered 2016-08-07 (×2): 300 mg via INTRAVENOUS
  Filled 2016-08-07 (×3): qty 50

## 2016-08-07 MED ORDER — ASPIRIN EC 81 MG PO TBEC
81.0000 mg | DELAYED_RELEASE_TABLET | Freq: Every day | ORAL | Status: DC
  Administered 2016-08-07 – 2016-08-08 (×2): 81 mg via ORAL
  Filled 2016-08-07 (×2): qty 1

## 2016-08-07 MED ORDER — MORPHINE SULFATE (PF) 2 MG/ML IV SOLN
2.0000 mg | INTRAVENOUS | Status: DC | PRN
  Administered 2016-08-08: 2 mg via INTRAVENOUS
  Filled 2016-08-07: qty 1

## 2016-08-07 MED ORDER — METOPROLOL TARTRATE 5 MG/5ML IV SOLN: 2.0000 mg | INTRAVENOUS | Status: DC | PRN

## 2016-08-07 MED ORDER — GUAIFENESIN-DM 100-10 MG/5ML PO SYRP: 15.0000 mL | ORAL_SOLUTION | ORAL | Status: DC | PRN

## 2016-08-07 MED ORDER — B COMPLEX PO TABS: 1.0000 | ORAL_TABLET | Freq: Every day | ORAL | Status: DC

## 2016-08-07 MED ORDER — FENTANYL CITRATE (PF) 100 MCG/2ML IJ SOLN
INTRAMUSCULAR | Status: AC
  Filled 2016-08-07: qty 2

## 2016-08-07 MED ORDER — CALCIUM 75 MG PO TABS: 750.0000 mg | ORAL_TABLET | Freq: Two times a day (BID) | ORAL | Status: DC

## 2016-08-07 MED ORDER — LABETALOL HCL 5 MG/ML IV SOLN: 10.0000 mg | INTRAVENOUS | Status: DC | PRN

## 2016-08-07 MED ORDER — OXYCODONE-ACETAMINOPHEN 5-325 MG PO TABS: 1.0000 | ORAL_TABLET | ORAL | Status: DC | PRN

## 2016-08-07 MED ORDER — ONDANSETRON HCL 4 MG/2ML IJ SOLN: 4.0000 mg | Freq: Four times a day (QID) | INTRAMUSCULAR | Status: DC | PRN

## 2016-08-07 MED ORDER — MIDAZOLAM HCL 2 MG/2ML IJ SOLN
INTRAMUSCULAR | Status: DC | PRN
  Administered 2016-08-07: 2 mg via INTRAVENOUS

## 2016-08-07 MED ORDER — B COMPLEX-C PO TABS
1.0000 | ORAL_TABLET | Freq: Every day | ORAL | Status: DC
  Filled 2016-08-07: qty 1

## 2016-08-07 MED ORDER — SODIUM CHLORIDE 0.9 % IJ SOLN
INTRAMUSCULAR | Status: AC
  Filled 2016-08-07: qty 10

## 2016-08-07 MED ORDER — HYDROCHLOROTHIAZIDE 25 MG PO TABS
25.0000 mg | ORAL_TABLET | Freq: Every day | ORAL | Status: DC
  Administered 2016-08-07 – 2016-08-08 (×2): 25 mg via ORAL
  Filled 2016-08-07 (×2): qty 1

## 2016-08-07 MED ORDER — SODIUM CHLORIDE 0.9 % IV SOLN: 500.0000 mL | Freq: Once | INTRAVENOUS | Status: DC | PRN

## 2016-08-07 MED ORDER — ACETAMINOPHEN 325 MG PO TABS: 325.0000 mg | ORAL_TABLET | ORAL | Status: DC | PRN

## 2016-08-07 MED ORDER — LIDOCAINE HCL (PF) 2 % IJ SOLN
INTRAMUSCULAR | Status: AC
  Filled 2016-08-07: qty 2

## 2016-08-07 MED ORDER — MIDAZOLAM HCL 2 MG/2ML IJ SOLN
INTRAMUSCULAR | Status: AC
  Filled 2016-08-07: qty 2

## 2016-08-07 MED ORDER — ROCURONIUM BROMIDE 50 MG/5ML IV SOLN
INTRAVENOUS | Status: AC
  Filled 2016-08-07: qty 1

## 2016-08-07 MED ORDER — FENTANYL CITRATE (PF) 100 MCG/2ML IJ SOLN
INTRAMUSCULAR | Status: DC | PRN
  Administered 2016-08-07: 100 ug via INTRAVENOUS

## 2016-08-07 MED ORDER — APIXABAN 5 MG PO TABS
5.0000 mg | ORAL_TABLET | Freq: Two times a day (BID) | ORAL | Status: DC
  Administered 2016-08-07 – 2016-08-08 (×2): 5 mg via ORAL
  Filled 2016-08-07 (×2): qty 1

## 2016-08-07 MED ORDER — ONDANSETRON HCL 4 MG/2ML IJ SOLN: 4.0000 mg | Freq: Once | INTRAMUSCULAR | Status: DC | PRN

## 2016-08-07 MED ORDER — ROSUVASTATIN CALCIUM 10 MG PO TABS
5.0000 mg | ORAL_TABLET | Freq: Every day | ORAL | Status: DC
  Administered 2016-08-07 – 2016-08-08 (×2): 5 mg via ORAL
  Filled 2016-08-07 (×2): qty 1

## 2016-08-07 MED ORDER — SENNOSIDES-DOCUSATE SODIUM 8.6-50 MG PO TABS: 1.0000 | ORAL_TABLET | Freq: Every evening | ORAL | Status: DC | PRN

## 2016-08-07 MED ORDER — TRAMADOL HCL 50 MG PO TABS
50.0000 mg | ORAL_TABLET | Freq: Four times a day (QID) | ORAL | Status: DC | PRN
  Administered 2016-08-07: 50 mg via ORAL
  Filled 2016-08-07: qty 1

## 2016-08-07 MED ORDER — ACETAMINOPHEN 10 MG/ML IV SOLN
INTRAVENOUS | Status: DC | PRN
  Administered 2016-08-07: 1000 mg via INTRAVENOUS

## 2016-08-07 MED ORDER — FAMOTIDINE 20 MG PO TABS
ORAL_TABLET | ORAL | Status: AC
  Administered 2016-08-07: 20 mg via ORAL
  Filled 2016-08-07: qty 1

## 2016-08-07 MED ORDER — FAMOTIDINE 20 MG PO TABS
20.0000 mg | ORAL_TABLET | Freq: Once | ORAL | Status: AC
  Administered 2016-08-07: 20 mg via ORAL

## 2016-08-07 MED ORDER — HYDRALAZINE HCL 20 MG/ML IJ SOLN: 5.0000 mg | INTRAMUSCULAR | Status: DC | PRN

## 2016-08-07 MED ORDER — VITAMIN D 1000 UNITS PO TABS
2000.0000 [IU] | ORAL_TABLET | Freq: Every day | ORAL | Status: DC
  Administered 2016-08-07 – 2016-08-08 (×2): 2000 [IU] via ORAL
  Filled 2016-08-07 (×2): qty 2

## 2016-08-07 MED ORDER — FENTANYL CITRATE (PF) 100 MCG/2ML IJ SOLN
25.0000 ug | INTRAMUSCULAR | Status: DC | PRN
  Administered 2016-08-07 (×4): 25 ug via INTRAVENOUS

## 2016-08-07 MED ORDER — ROCURONIUM BROMIDE 100 MG/10ML IV SOLN
INTRAVENOUS | Status: DC | PRN
  Administered 2016-08-07: 40 mg via INTRAVENOUS
  Administered 2016-08-07: 10 mg via INTRAVENOUS

## 2016-08-07 MED ORDER — ACETAMINOPHEN 650 MG RE SUPP: 325.0000 mg | RECTAL | Status: DC | PRN

## 2016-08-07 MED ORDER — ONDANSETRON HCL 4 MG/2ML IJ SOLN
INTRAMUSCULAR | Status: DC | PRN
  Administered 2016-08-07: 4 mg via INTRAVENOUS

## 2016-08-07 MED ORDER — CHLORHEXIDINE GLUCONATE CLOTH 2 % EX PADS: 6.0000 | MEDICATED_PAD | Freq: Once | CUTANEOUS | Status: DC

## 2016-08-07 MED ORDER — DEXAMETHASONE SODIUM PHOSPHATE 10 MG/ML IJ SOLN
INTRAMUSCULAR | Status: AC
  Filled 2016-08-07: qty 1

## 2016-08-07 MED ORDER — SUGAMMADEX SODIUM 200 MG/2ML IV SOLN
INTRAVENOUS | Status: DC | PRN
  Administered 2016-08-07: 128 mg via INTRAVENOUS

## 2016-08-07 MED ORDER — NITROGLYCERIN IN D5W 200-5 MCG/ML-% IV SOLN
5.0000 ug/min | INTRAVENOUS | Status: DC
  Filled 2016-08-07: qty 250

## 2016-08-07 MED ORDER — CARVEDILOL 12.5 MG PO TABS
25.0000 mg | ORAL_TABLET | Freq: Two times a day (BID) | ORAL | Status: DC
  Administered 2016-08-07 – 2016-08-08 (×2): 25 mg via ORAL
  Filled 2016-08-07 (×2): qty 2

## 2016-08-07 MED ORDER — DEXAMETHASONE SODIUM PHOSPHATE 10 MG/ML IJ SOLN
INTRAMUSCULAR | Status: DC | PRN
  Administered 2016-08-07: 10 mg via INTRAVENOUS

## 2016-08-07 MED ORDER — ONDANSETRON HCL 4 MG/2ML IJ SOLN
INTRAMUSCULAR | Status: AC
  Filled 2016-08-07: qty 2

## 2016-08-07 MED ORDER — PHENYLEPHRINE HCL 10 MG/ML IJ SOLN
INTRAMUSCULAR | Status: AC
  Filled 2016-08-07: qty 1

## 2016-08-07 MED ORDER — AMLODIPINE BESYLATE 5 MG PO TABS
5.0000 mg | ORAL_TABLET | Freq: Every day | ORAL | Status: DC
  Administered 2016-08-08: 5 mg via ORAL
  Filled 2016-08-07: qty 1

## 2016-08-07 MED ORDER — HEPARIN SODIUM (PORCINE) 1000 UNIT/ML IJ SOLN
INTRAMUSCULAR | Status: DC | PRN
  Administered 2016-08-07: 4000 [IU] via INTRAVENOUS

## 2016-08-07 MED ORDER — SUCCINYLCHOLINE CHLORIDE 20 MG/ML IJ SOLN
INTRAMUSCULAR | Status: AC
  Filled 2016-08-07: qty 1

## 2016-08-07 MED ORDER — PROPOFOL 10 MG/ML IV BOLUS
INTRAVENOUS | Status: DC | PRN
  Administered 2016-08-07: 120 mg via INTRAVENOUS

## 2016-08-07 MED ORDER — LIDOCAINE HCL (CARDIAC) 20 MG/ML IV SOLN
INTRAVENOUS | Status: DC | PRN
  Administered 2016-08-07: 100 mg via INTRAVENOUS

## 2016-08-07 MED ORDER — SUGAMMADEX SODIUM 200 MG/2ML IV SOLN
INTRAVENOUS | Status: AC
  Filled 2016-08-07: qty 2

## 2016-08-07 MED ORDER — TURMERIC 450 MG PO CAPS: 450.0000 mg | ORAL_CAPSULE | Freq: Every day | ORAL | Status: DC

## 2016-08-07 MED ORDER — COQ10 50 MG PO CAPS: 1.0000 | ORAL_CAPSULE | Freq: Every day | ORAL | Status: DC

## 2016-08-07 MED ORDER — MAGNESIUM SULFATE 2 GM/50ML IV SOLN
2.0000 g | Freq: Every day | INTRAVENOUS | Status: DC | PRN
  Filled 2016-08-07: qty 50

## 2016-08-07 MED ORDER — DOCUSATE SODIUM 100 MG PO CAPS
100.0000 mg | ORAL_CAPSULE | Freq: Every day | ORAL | Status: DC
  Administered 2016-08-08: 100 mg via ORAL
  Filled 2016-08-07: qty 1

## 2016-08-07 MED ORDER — PROPOFOL 10 MG/ML IV BOLUS
INTRAVENOUS | Status: AC
  Filled 2016-08-07: qty 20

## 2016-08-07 MED ORDER — POTASSIUM CHLORIDE CRYS ER 20 MEQ PO TBCR: 20.0000 meq | EXTENDED_RELEASE_TABLET | Freq: Every day | ORAL | Status: DC | PRN

## 2016-08-07 MED ORDER — EPHEDRINE SULFATE 50 MG/ML IJ SOLN
INTRAMUSCULAR | Status: AC
  Filled 2016-08-07: qty 1

## 2016-08-07 MED ORDER — PHENOL 1.4 % MT LIQD
1.0000 | OROMUCOSAL | Status: DC | PRN
  Filled 2016-08-07: qty 177

## 2016-08-07 MED ORDER — SUCCINYLCHOLINE CHLORIDE 20 MG/ML IJ SOLN
INTRAMUSCULAR | Status: DC | PRN
  Administered 2016-08-07: 100 mg via INTRAVENOUS

## 2016-08-07 MED ORDER — ACETAMINOPHEN 10 MG/ML IV SOLN
INTRAVENOUS | Status: AC
  Filled 2016-08-07: qty 100

## 2016-08-07 MED ORDER — SODIUM CHLORIDE 0.9 % IV SOLN
INTRAVENOUS | Status: DC
  Administered 2016-08-07 – 2016-08-08 (×2): via INTRAVENOUS

## 2016-08-07 MED ORDER — DOPAMINE-DEXTROSE 3.2-5 MG/ML-% IV SOLN
3.0000 ug/kg/min | INTRAVENOUS | Status: DC
Start: 2016-08-07 — End: 2016-08-07
  Filled 2016-08-07: qty 250

## 2016-08-07 MED ORDER — EVICEL 2 ML EX KIT
PACK | CUTANEOUS | Status: AC
  Filled 2016-08-07: qty 1

## 2016-08-07 MED ORDER — CHLORHEXIDINE GLUCONATE CLOTH 2 % EX PADS
6.0000 | MEDICATED_PAD | Freq: Once | CUTANEOUS | Status: AC
  Administered 2016-08-07: 6 via TOPICAL

## 2016-08-07 MED ORDER — SORBITOL 70 % SOLN
30.0000 mL | Freq: Every day | Status: DC | PRN
  Filled 2016-08-07: qty 30

## 2016-08-07 SURGICAL SUPPLY — 56 items
APPLIER CLIP 11 MED OPEN (CLIP)
APPLIER CLIP 9.375 SM OPEN (CLIP)
BAG COUNTER SPONGE EZ (MISCELLANEOUS) IMPLANT
BAG DECANTER FOR FLEXI CONT (MISCELLANEOUS) ×2 IMPLANT
BLADE SURG 15 STRL LF DISP TIS (BLADE) ×1 IMPLANT
BLADE SURG 15 STRL SS (BLADE) ×1
BLADE SURG SZ11 CARB STEEL (BLADE) ×2 IMPLANT
BOOT SUTURE AID YELLOW STND (SUTURE) ×2 IMPLANT
BRUSH SCRUB 4% CHG (MISCELLANEOUS) ×2 IMPLANT
CANISTER SUCT 1200ML W/VALVE (MISCELLANEOUS) ×2 IMPLANT
CATH TRAY 16F METER LATEX (MISCELLANEOUS) ×2 IMPLANT
CLIP APPLIE 11 MED OPEN (CLIP) IMPLANT
CLIP APPLIE 9.375 SM OPEN (CLIP) IMPLANT
DERMABOND ADVANCED (GAUZE/BANDAGES/DRESSINGS) ×1
DERMABOND ADVANCED .7 DNX12 (GAUZE/BANDAGES/DRESSINGS) ×1 IMPLANT
DRAPE INCISE IOBAN 66X45 STRL (DRAPES) ×2 IMPLANT
DRAPE LAPAROTOMY 100X77 ABD (DRAPES) ×2 IMPLANT
DRSG OPSITE POSTOP 4X10 (GAUZE/BANDAGES/DRESSINGS) ×2 IMPLANT
DURAPREP 26ML APPLICATOR (WOUND CARE) ×2 IMPLANT
ELECT CAUTERY BLADE 6.4 (BLADE) ×2 IMPLANT
ELECT REM PT RETURN 9FT ADLT (ELECTROSURGICAL) ×2
ELECTRODE REM PT RTRN 9FT ADLT (ELECTROSURGICAL) ×1 IMPLANT
GLOVE BIO SURGEON STRL SZ7 (GLOVE) ×4 IMPLANT
GLOVE INDICATOR 7.5 STRL GRN (GLOVE) ×2 IMPLANT
GOWN STRL REUS W/ TWL LRG LVL3 (GOWN DISPOSABLE) ×1 IMPLANT
GOWN STRL REUS W/ TWL XL LVL3 (GOWN DISPOSABLE) ×1 IMPLANT
GOWN STRL REUS W/TWL LRG LVL3 (GOWN DISPOSABLE) ×1
GOWN STRL REUS W/TWL XL LVL3 (GOWN DISPOSABLE) ×1
HEMOSTAT SURGICEL 2X3 (HEMOSTASIS) ×2 IMPLANT
IV NS 500ML (IV SOLUTION) ×1
IV NS 500ML BAXH (IV SOLUTION) ×1 IMPLANT
KIT RM TURNOVER STRD PROC AR (KITS) ×2 IMPLANT
LABEL OR SOLS (LABEL) ×2 IMPLANT
LOOP RED MAXI  1X406MM (MISCELLANEOUS) ×1
LOOP VESSEL MAXI 1X406 RED (MISCELLANEOUS) ×1 IMPLANT
LOOP VESSEL MINI 0.8X406 BLUE (MISCELLANEOUS) ×4 IMPLANT
LOOPS BLUE MINI 0.8X406MM (MISCELLANEOUS) ×4
NS IRRIG 500ML POUR BTL (IV SOLUTION) ×2 IMPLANT
PACK BASIN MAJOR ARMC (MISCELLANEOUS) ×2 IMPLANT
PATCH CAROTID ECM VASC 1X10 (Prosthesis & Implant Heart) ×2 IMPLANT
SUT PROLENE 5 0 RB 1 DA (SUTURE) IMPLANT
SUT PROLENE 6 0 BV (SUTURE) ×8 IMPLANT
SUT PROLENE 7 0 BV 1 (SUTURE) ×8 IMPLANT
SUT SILK 2 0 (SUTURE) ×1
SUT SILK 2-0 18XBRD TIE 12 (SUTURE) ×1 IMPLANT
SUT SILK 3 0 (SUTURE) ×1
SUT SILK 3-0 18XBRD TIE 12 (SUTURE) ×1 IMPLANT
SUT SILK 4 0 (SUTURE) ×1
SUT SILK 4-0 18XBRD TIE 12 (SUTURE) ×1 IMPLANT
SUT VIC AB 2-0 CT1 27 (SUTURE) ×2
SUT VIC AB 2-0 CT1 TAPERPNT 27 (SUTURE) ×2 IMPLANT
SUT VIC AB 3-0 SH 27 (SUTURE) ×1
SUT VIC AB 3-0 SH 27X BRD (SUTURE) ×1 IMPLANT
SUT VICRYL+ 3-0 36IN CT-1 (SUTURE) ×4 IMPLANT
SYR 20CC LL (SYRINGE) ×2 IMPLANT
SYR 5ML LL (SYRINGE) ×2 IMPLANT

## 2016-08-07 NOTE — Anesthesia Post-op Follow-up Note (Cosign Needed)
Anesthesia QCDR form completed.        

## 2016-08-07 NOTE — H&P (Signed)
[facility] History & Physical Update  The patient was interviewed and re-examined.  The patient's previous History and Physical has been reviewed and is unchanged.  There is no change in the plan of care. We plan to proceed with the scheduled procedure of right femoral endarterectomy.  Festus BarrenJason Dew, MD  08/07/2016, 7:33 AM

## 2016-08-07 NOTE — Progress Notes (Signed)
Spoke with Dr.Dew okay for pt to be on a Regula Diet. Okay to discontinue order for q 1 neuro checks per MD. Also per MD place order for labetalol 10mg  IV push prn for systolic BP greater than 160.

## 2016-08-07 NOTE — Transfer of Care (Signed)
Immediate Anesthesia Transfer of Care Note  Patient: Ashley SpellBetty J Rawdon  Procedure(s) Performed: Procedure(s): ENDARTERECTOMY FEMORAL (Right)  Patient Location: PACU  Anesthesia Type:General  Level of Consciousness: sedated  Airway & Oxygen Therapy: Patient Spontanous Breathing and Patient connected to face mask oxygen  Post-op Assessment: Report given to RN and Post -op Vital signs reviewed and stable  Post vital signs: Reviewed and stable  Last Vitals:  Vitals:   08/07/16 0631  BP: (!) 161/80  Pulse: 89  Resp: 18  Temp: 36.5 C    Last Pain:  Vitals:   08/07/16 0631  TempSrc: Oral  PainSc: 5          Complications: No apparent anesthesia complications

## 2016-08-07 NOTE — Op Note (Signed)
OPERATIVE NOTE   PROCEDURE: 1. Right common femoral, profunda femoris, and superficial femoral artery endarterectomies and patch angioplasty    PRE-OPERATIVE DIAGNOSIS: 1.Atherosclerotic occlusive disease right lower extremities with rest pain 2. Multiple failed previous LE interventions.  POST-OPERATIVE DIAGNOSIS: Same  SURGEON: Aki Burdin, MD  ANESTHESIA: general  ESTIMATEFestus Barren BLOOD LOSS: 50 cc  FINDING(S): 1. significant plaque in right common femoral, profunda femoris, and superficial femoral arteries  SPECIMEN(S): Right common femoral, profunda femoris, and superficial femoral artery plaque.  INDICATIONS:  Patient presents with recurrent rest pain symptoms.  Right femoral endarterectomy is planned to try to improve perfusion. The risks and benefits as well as alternative therapies including intervention were reviewed in detail all questions were answered the patient agrees to proceed with surgery.  DESCRIPTION: After obtaining full informed written consent, the patient was brought back to the operating room and placed supine upon the operating table. The patient received IV antibiotics prior to induction. After obtaining adequate anesthesia, the patient was prepped and draped in the standard fashion appropriate time out is called.   Vertical incision was created overlying the right femoral arteries. The common femoral artery proximally, and superficial femoral artery, and primary profunda femoris artery branches were encircled with vessel loops and prepared for control. The right femoral arteries were found to have significant plaque from the common femoral artery into the profunda and superficial femoral arteries.   4000 units of heparin was given and allowed circulate for 5 minutes.   Attention is then turned to the right femoral artery. An arteriotomy is made with 11 blade and extended with Potts scissors in the common femoral artery and carried down onto the first  2 cm of the profunda femorus artery. An endarterectomy was then performed. The [facility] elevator was used to create a plane. The proximal endpoint was cut flush with tenotomy scissors. This was in the proximal common femoral artery. An eversion endarterectomy was then performed for the first 2-3 cm of the SFA to clear the proximal SFA. Fair backbleeding was then seen from the SFA. The distal endpoint of the profunda femorus endarterectomy was created with gentle traction and the distal endpoint was clean.  I tacked down the fundus femoris origin with three 7-0 Prolene tacking sutures. The Cormatrix patcth is then selected and prepared for a patch angioplasty.  It is cut and beveled and started at the proximal endpoint with a 6-0 Prolene suture.  Approximately one half of the suture line is run medially and laterally and the distal end point was cut and bevelled to match the arteriotomy.  A second 6-0 Prolene was started at the distal end point and run to the mid portion to complete the arteriotomy.  The vessel was flushed prior to release of control and completion of the anastomosis.  At this point, flow was established first to the profunda femoris artery and then to the superficial femoral artery. Easily palpable pulses are noted beyond the anastomosis in both arteries.  Surgicel and Evicel topical hemostatic agents were placed in the femoral incision and hemostasis was complete. The femoral incision was then closed in a layered fashion with 2 layers of 2-0 Vicryl, 2 layers of 3-0 Vicryl, and 4-0 Monocryl for the skin closure. Dermabond and sterile dressing were then placed over the incision.  The patient was then awakened from anesthesia and taken to the recovery room in stable condition having tolerated the procedure well.  COMPLICATIONS: None  CONDITION: Stable     Festus BarrenJason Meher Kucinski 08/07/2016 10:01  AM   This note was created with Dragon Medical transcription system. Any errors in dictation are purely  unintentional.

## 2016-08-07 NOTE — Progress Notes (Signed)
PHARMACIST - PHYSICIAN ORDER COMMUNICATION  CONCERNING: P&T Medication Policy on Herbal Medications  DESCRIPTION:  This patient's order for:  TURMERIC  And CoQ10  has been noted.  This product(s) is classified as an "herbal" or natural product. Due to a lack of definitive safety studies or FDA approval, nonstandard manufacturing practices, plus the potential risk of unknown drug-drug interactions while on inpatient medications, the Pharmacy and Therapeutics Committee does not permit the use of "herbal" or natural products of this type within [facility].   ACTION TAKEN: The pharmacy department is unable to verify this order at this time and your patient has been informed of this safety policy. Please reevaluate patient's clinical condition at discharge and address if the herbal or natural product(s) should be resumed at that time.    Bari MantisKristin Kahlin Mark PharmD Clinical Pharmacist 08/07/2016

## 2016-08-07 NOTE — OR Nursing (Signed)
4000 units of heparin given at [phone]

## 2016-08-07 NOTE — Anesthesia Procedure Notes (Signed)
Procedure Name: Intubation Date/Time: 08/07/2016 7:50 AM Performed by: Nelda Marseille Pre-anesthesia Checklist: Patient identified, Patient being monitored, Timeout performed, Emergency Drugs available and Suction available Patient Re-evaluated:Patient Re-evaluated prior to inductionOxygen Delivery Method: Circle system utilized Preoxygenation: Pre-oxygenation with 100% oxygen Intubation Type: IV induction Ventilation: Mask ventilation without difficulty Laryngoscope Size: Mac and 3 Grade View: Grade I Tube type: Oral Tube size: 7.0 mm Number of attempts: 1 Airway Equipment and Method: Stylet Placement Confirmation: ETT inserted through vocal cords under direct vision,  positive ETCO2 and breath sounds checked- equal and bilateral Secured at: 21 cm Tube secured with: Tape Dental Injury: Teeth and Oropharynx as per pre-operative assessment

## 2016-08-07 NOTE — Progress Notes (Signed)
Spoke with Dr. Wyn Quakerew and okay per MD to discontinue continuous drips that it seems may have been from patients last visit.

## 2016-08-07 NOTE — Anesthesia Preprocedure Evaluation (Signed)
Anesthesia Evaluation  Patient identified by MRN, date of birth, ID band Patient awake    Reviewed: Allergy & Precautions, H&P , NPO status , Patient's Chart, lab work & pertinent test results, reviewed documented beta blocker date and time   Airway Mallampati: II  TM Distance: >3 FB Neck ROM: full    Dental  (+) Teeth Intact   Pulmonary neg pulmonary ROS, former smoker,    Pulmonary exam normal        Cardiovascular Exercise Tolerance: Good hypertension, negative cardio ROS Normal cardiovascular exam Rate:Normal     Neuro/Psych negative neurological ROS  negative psych ROS   GI/Hepatic negative GI ROS, Neg liver ROS,   Endo/Other  negative endocrine ROS  Renal/GU negative Renal ROS  negative genitourinary   Musculoskeletal   Abdominal   Peds  Hematology negative hematology ROS (+)   Anesthesia Other Findings   Reproductive/Obstetrics negative OB ROS                             Anesthesia Physical Anesthesia Plan  ASA: III  Anesthesia Plan: General LMA   Post-op Pain Management:    Induction:   Airway Management Planned:   Additional Equipment:   Intra-op Plan:   Post-operative Plan:   Informed Consent: I have reviewed the patients History and Physical, chart, labs and discussed the procedure including the risks, benefits and alternatives for the proposed anesthesia with the patient or authorized representative who has indicated his/her understanding and acceptance.     Plan Discussed with: CRNA  Anesthesia Plan Comments:         Anesthesia Quick Evaluation

## 2016-08-08 ENCOUNTER — Encounter: Payer: Self-pay | Admitting: Vascular Surgery

## 2016-08-08 LAB — BASIC METABOLIC PANEL
ANION GAP: 8 (ref 5–15)
BUN: 18 mg/dL (ref 6–20)
CHLORIDE: 101 mmol/L (ref 101–111)
CO2: 27 mmol/L (ref 22–32)
Calcium: 8.8 mg/dL — ABNORMAL LOW (ref 8.9–10.3)
Creatinine, Ser: 0.46 mg/dL (ref 0.44–1.00)
GFR calc Af Amer: >60 mL/min (ref >60)
GFR calc non Af Amer: >60 mL/min (ref >60)
GLUCOSE: 160 mg/dL — AB (ref 65–99)
POTASSIUM: 3.1 mmol/L — AB (ref 3.5–5.1)
Sodium: 136 mmol/L (ref 135–145)

## 2016-08-08 LAB — CBC
HEMATOCRIT: 33.9 % — AB (ref 35.0–47.0)
Hemoglobin: 11.8 g/dL — ABNORMAL LOW (ref 12.0–16.0)
MCH: 31.4 pg (ref 26.0–34.0)
MCHC: 34.9 g/dL (ref 32.0–36.0)
MCV: 90.1 fL (ref 80.0–100.0)
Platelets: 240 10*3/uL (ref 150–440)
RBC: 3.76 MIL/uL — AB (ref 3.80–5.20)
RDW: 13.7 % (ref 11.5–14.5)
WBC: 12.2 10*3/uL — ABNORMAL HIGH (ref 3.6–11.0)

## 2016-08-08 LAB — SURGICAL PATHOLOGY

## 2016-08-08 MED ORDER — LORAZEPAM 2 MG/ML IJ SOLN: 1.0000 mg | Freq: Four times a day (QID) | INTRAMUSCULAR | Status: DC | PRN

## 2016-08-08 MED ORDER — LORAZEPAM 2 MG/ML IJ SOLN
0.0000 mg | Freq: Four times a day (QID) | INTRAMUSCULAR | Status: DC
  Administered 2016-08-08: 2 mg via INTRAVENOUS

## 2016-08-08 MED ORDER — LORAZEPAM 2 MG/ML IJ SOLN: 0.0000 mg | Freq: Two times a day (BID) | INTRAMUSCULAR | Status: DC

## 2016-08-08 MED ORDER — VITAMIN B-1 100 MG PO TABS
100.0000 mg | ORAL_TABLET | Freq: Every day | ORAL | Status: DC
  Administered 2016-08-08: 100 mg via ORAL
  Filled 2016-08-08: qty 1

## 2016-08-08 MED ORDER — ADULT MULTIVITAMIN W/MINERALS CH
1.0000 | ORAL_TABLET | Freq: Every day | ORAL | Status: DC
  Administered 2016-08-08: 1 via ORAL
  Filled 2016-08-08: qty 1

## 2016-08-08 MED ORDER — LORAZEPAM 1 MG PO TABS: 1.0000 mg | ORAL_TABLET | Freq: Four times a day (QID) | ORAL | Status: DC | PRN

## 2016-08-08 MED ORDER — LORAZEPAM 2 MG/ML IJ SOLN
INTRAMUSCULAR | Status: AC
  Administered 2016-08-08: 03:00:00
  Filled 2016-08-08: qty 1

## 2016-08-08 MED ORDER — FOLIC ACID 1 MG PO TABS
1.0000 mg | ORAL_TABLET | Freq: Every day | ORAL | Status: DC
  Administered 2016-08-08: 1 mg via ORAL
  Filled 2016-08-08: qty 1

## 2016-08-08 MED ORDER — THIAMINE HCL 100 MG/ML IJ SOLN: 100.0000 mg | Freq: Every day | INTRAMUSCULAR | Status: DC

## 2016-08-08 NOTE — Progress Notes (Signed)
[facility]  Daily Progress Note   Subjective  - 1 Day Post-Op  Sleeping. Had a rough night with confusion.  Objective Vitals:   08/07/16 1704 08/07/16 2027 08/08/16 0252 08/08/16 0346  BP: (!) 134/48 (!) 125/45  (!) 144/72  Pulse: 74 73 100 95  Resp:  20  16  Temp:  97.4 F (36.3 C)  97.5 F (36.4 C)  TempSrc:  Oral  Oral  SpO2:  98% 96% 97%  Weight:      Height:        Intake/Output Summary (Last 24 hours) at 08/08/16 0841 Last data filed at 08/08/16 0346  Gross per 24 hour  Intake             1383 ml  Output             1530 ml  Net             -147 ml    PULM  CTAB CV  RRR VASC  Incision C/D/I  Laboratory CBC    Component Value Date/Time   WBC 12.2 (H) 08/08/2016 0018   HGB 11.8 (L) 08/08/2016 0018   HCT 33.9 (L) 08/08/2016 0018   PLT 240 08/08/2016 0018    BMET    Component Value Date/Time   NA 136 08/08/2016 0018   NA 142 04/29/2013 1023   K 3.1 (L) 08/08/2016 0018   CL 101 08/08/2016 0018   CO2 27 08/08/2016 0018   GLUCOSE 160 (H) 08/08/2016 0018   BUN 18 08/08/2016 0018   BUN 22 04/29/2013 1023   CREATININE 0.46 08/08/2016 0018   CALCIUM 8.8 (L) 08/08/2016 0018   GFRNONAA >60 08/08/2016 0018   GFRAA >60 08/08/2016 0018    Assessment/Planning: POD #1 s/p right femoral endarterectomy   Will reassess later today for discharge with home health services  Restart Eliquis    Festus BarrenJason Dew  08/08/2016, 8:41 AM

## 2016-08-08 NOTE — Anesthesia Postprocedure Evaluation (Signed)
Anesthesia Post Note  Patient: Ashley Hill  Procedure(s) Performed: Procedure(s) (LRB): ENDARTERECTOMY FEMORAL (Right)  Patient location during evaluation: PACU Anesthesia Type: General Level of consciousness: awake and alert Pain management: pain level controlled Vital Signs Assessment: post-procedure vital signs reviewed and stable Respiratory status: spontaneous breathing, nonlabored ventilation, respiratory function stable and patient connected to nasal cannula oxygen Cardiovascular status: blood pressure returned to baseline and stable Postop Assessment: no signs of nausea or vomiting Anesthetic complications: no     Last Vitals:  Vitals:   08/08/16 0252 08/08/16 0346  BP:  (!) 144/72  Pulse: 100 95  Resp:  16  Temp:  36.4 C    Last Pain:  Vitals:   08/08/16 0346  TempSrc: Oral  PainSc:                  [name]

## 2016-08-08 NOTE — Care Management (Signed)
Patient discharged home post femoral endarterectomy.  Patient's daughter will be staying with her around the clock.  Patient has Rw and cane in the home.  Patient discharged with home health orders for PT, RN, and aide.  Services arranged through [facility].  [facility] with [facility].  RNCM signing off.

## 2016-08-08 NOTE — Progress Notes (Signed)
Alert and oriented. Vital signs stable . No signs of acute distress. Discharge instructions given. Patient verbalized understanding. No other issues noted at this time.    

## 2016-08-08 NOTE — Evaluation (Signed)
Physical Therapy Evaluation Patient Details Name: Ernst SpellBetty J Brann MRN: [id number] DOB: 1934/12/02 Today's Date: 08/08/2016   History of Present Illness  82 y/o female with history of multiple vascular surgeries on the R, is now s/p endartectomy.  Pt has been confused and somewhat combative, on CIWA protocol.  Clinical Impression  Pt was very confused and lacked awareness t/o the entire session.  She is able to do some limited ambulation but needed close guarding and inconsistent HHA secondary to poor safety awareness and generally needing constant cuing and reorientation.  Pt should be able to return home with 24/7 assist, she is not at her physical baseline - per progress she may need PT at home.      Follow Up Recommendations Home health PT (per pt ability/willingness to participate)    Equipment Recommendations   (has walker and cane at home)    Recommendations for Other Services       Precautions / Restrictions Precautions Precautions: Fall Restrictions RLE Weight Bearing: Weight bearing as tolerated      Mobility  Bed Mobility Overal bed mobility: Modified Independent             General bed mobility comments: Pt able to get herself to EOB w/o assist, only needing cues for safety and awareness.  Transfers Overall transfer level: Modified independent Equipment used: 1 person hand held assist             General transfer comment: Pt is initially unsteady rising to standing and needs HHA to maintain balance.  She had some confusion and needed cuing to insure safety awareness and maintain balance.   Ambulation/Gait Ambulation/Gait assistance: Min guard Ambulation Distance (Feet): 75 Feet Assistive device: 1 person hand held assist       General Gait Details: Pt lacks awareness and had generally unsteadiness, but was able to ambulate with close guarding and constant reorientation and cuing.   Stairs            Wheelchair Mobility    Modified Rankin  (Stroke Patients Only)       Balance Overall balance assessment: Needs assistance             Standing balance comment: Pt showed poor awareness and need assist more secondary to mental status than purely balance.                              Pertinent Vitals/Pain Pain Assessment:  (unrated R groin pain associated with sx)    Home Living Family/patient expects to be discharged to:: Private residence Living Arrangements: Alone Available Help at Discharge: Family (daughters will be staying with her around the clock )   Home Access: Level entry     Home Layout: One level Home Equipment: Environmental consultantWalker - 2 wheels;Cane - single point      Prior Function Level of Independence: Independent         Comments: Pt has had some recently R LE pain limiting activity, but apparently she is normally able to do all she needs w/o issue.     Hand Dominance        Extremity/Trunk Assessment   Upper Extremity Assessment Upper Extremity Assessment: Overall WFL for tasks assessed    Lower Extremity Assessment Lower Extremity Assessment: RLE deficits/detail;Generalized weakness RLE Deficits / Details: self limiting on the R secondary to surgical pain, grossly functional t/o       Communication   Communication:  (Pt  with confused, delayed and somewhat slurred speech)  Cognition Arousal/Alertness: Lethargic Behavior During Therapy: Restless;Impulsive Overall Cognitive Status: Impaired/Different from baseline Area of Impairment: Attention;Following commands;Awareness;Safety/judgement                                      General Comments      Exercises     Assessment/Plan    PT Assessment Patient needs continued PT services  PT Problem List Decreased strength;Decreased range of motion;Decreased balance;Decreased activity tolerance;Decreased mobility;Decreased knowledge of use of DME;Decreased safety awareness;Decreased cognition;Decreased  coordination       PT Treatment Interventions DME instruction;Gait training;Functional mobility training;Therapeutic activities;Balance training;Neuromuscular re-education;Therapeutic exercise;Cognitive remediation;Patient/family education    PT Goals (Current goals can be found in the Care Plan section)  Acute Rehab PT Goals Patient Stated Goal: go home PT Goal Formulation: With patient/family Time For Goal Achievement: 08/22/16 Potential to Achieve Goals: Fair    Frequency Min 2X/week   Barriers to discharge        Co-evaluation               End of Session Equipment Utilized During Treatment: Gait belt Activity Tolerance: Patient limited by pain;Treatment limited secondary to agitation Patient left: with family/visitor present;with bed alarm set;with call bell/phone within reach   PT Visit Diagnosis: Unsteadiness on feet (R26.81);Difficulty in walking, not elsewhere classified (R26.2)    Time: 1610-9604 PT Time Calculation (min) (ACUTE ONLY): 20 min   Charges:   PT Evaluation $PT Eval Low Complexity: 1 Procedure     PT G Codes:          Malachi Pro, DPT 08/08/2016, 11:49 AM

## 2016-08-08 NOTE — Plan of Care (Signed)
Problem: Education: Goal: Knowledge of [facility] Education information/materials will improve Outcome: Not Progressing Patient needs assistance.  Problem: Health Behavior/Discharge Planning: Goal: Ability to manage health-related needs will improve Outcome: Not Progressing Patient needs assistance.  Problem: Health Behavior/Discharge Planning: Goal: Identification of resources available to assist in meeting health care needs will improve Outcome: Not Progressing Patient needs assistance.

## 2016-08-08 NOTE — Progress Notes (Signed)
Called Dr. Wyn Quakerew regarding patient becoming increasingly agitated.  Orders placed for CIWA protocol and ativan administration.  Telemetry monitor was also discharged.  Arturo MortonClay, Ashley Hill N  08/08/2016  2:35 AM

## 2016-08-08 NOTE — Discharge Summary (Signed)
[facility]    Discharge Summary    Patient ID:  Ashley Hill MRN: [id number] DOB/AGE: 08-11-1934 82 y.o.  Admit date: 08/07/2016 Discharge date: 08/08/2016 Date of Surgery: 08/07/2016 Surgeon: Surgeon(s): Annice Needy, MD  Admission Diagnosis: ENDARTERECTOMY FEMORAL  Discharge Diagnoses:  ENDARTERECTOMY FEMORAL  Secondary Diagnoses: Past Medical History:  Diagnosis Date  . Dyslipidemia   . Ectopic atrial tachycardia (HCC)   . Environmental allergies   . H/O scarlet fever   . Heart murmur   . HOH (hard of hearing)   . Hyperlipidemia   . Hypertension   . Peripheral vascular disease (HCC)   . 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 Harvie Bridge, [place].  . Transient cerebral ischemia     Procedure(s): ENDARTERECTOMY FEMORAL  Discharged Condition: good  HPI:  Patient with severe PAD and rest pain despite multiple previous procedures on the right leg  Hospital Course:  Ashley Hill is a 82 y.o. female is S/P Right Procedure(s): ENDARTERECTOMY FEMORAL Extubated: POD # 0 Physical exam: foot reasonably warm, good capillary refill Post-op wounds clean, dry, intact or healing well Pt. Ambulating, voiding and taking PO diet without difficulty. Pt pain controlled with PO pain meds. Labs as below Complications:none  Consults:    Significant Diagnostic Studies: CBC Lab Results  Component Value Date   WBC 12.2 (H) 08/08/2016   HGB 11.8 (L) 08/08/2016   HCT 33.9 (L) 08/08/2016   MCV 90.1 08/08/2016   PLT 240 08/08/2016    BMET    Component Value Date/Time   NA 136 08/08/2016 0018   NA 142 04/29/2013 1023   K 3.1 (L) 08/08/2016 0018   CL 101 08/08/2016 0018   CO2 27 08/08/2016 0018   GLUCOSE 160 (H) 08/08/2016 0018   BUN 18 08/08/2016 0018   BUN 22 04/29/2013 1023   CREATININE 0.46 08/08/2016 0018   CALCIUM 8.8 (L) 08/08/2016 0018   GFRNONAA >60 08/08/2016 0018   GFRAA >60 08/08/2016 0018   COAG Lab Results  Component Value Date   INR 1.06 08/04/2016   INR 0.99 07/25/2016   INR 1.0 12/09/2013     Disposition:  Discharge to :Home Discharge Instructions    Call MD for:  redness, tenderness, or signs of infection (pain, swelling, bleeding, redness, odor or green/yellow discharge around incision site)    Complete by:  As directed    Call MD for:  severe or increased pain, loss or decreased feeling  in affected limb(s)    Complete by:  As directed    Call MD for:  temperature >100.5    Complete by:  As directed    Driving Restrictions    Complete by:  As directed    No driving for one week   Lifting restrictions    Complete by:  As directed    No lifting for one week   No dressing needed    Complete by:  As directed    Replace only if drainage present   Resume previous diet    Complete by:  As directed      Allergies as of 08/08/2016      Reactions   Penicillins Anaphylaxis   Has patient had a PCN reaction causing immediate rash, facial/tongue/throat swelling, SOB or lightheadedness with hypotension: Yes Has patient had a PCN reaction causing severe rash involving mucus membranes or skin necrosis: No Has patient had a PCN reaction  that required hospitalization No Has patient had a PCN reaction occurring within the last 10 years: No If all of the above answers are "NO", then may proceed with Cephalosporin use.   Statins    Cramps in legs   Lipitor [atorvastatin]       Medication List    TAKE these medications   amLODipine 5 MG tablet Commonly known as:  NORVASC Take 5 mg by mouth daily.   apixaban 5 MG Tabs tablet Commonly known as:  ELIQUIS Take 1 tablet (5 mg total) by mouth 2 (two) times daily.   aspirin 81 MG tablet Take 81 mg by mouth daily.   b complex vitamins tablet Take 1 tablet by mouth daily.   CALCIUM PO Take 750 mg by mouth 2 (two) times daily.   carvedilol 25 MG tablet Commonly known as:  COREG Take  25 mg by mouth 2 (two) times daily with a meal.   cholecalciferol 1000 units tablet Commonly known as:  VITAMIN D Take 2,000 Units by mouth daily.   CoQ10 50 MG Caps Take 1 capsule by mouth daily.   CRESTOR 5 MG tablet Generic drug:  rosuvastatin TAKE 1 TABLET BY MOUTH EVERY DAY   hydrochlorothiazide 25 MG tablet Commonly known as:  HYDRODIURIL TAKE 1 TABLET BY MOUTH EVERY DAY   traMADol 50 MG tablet Commonly known as:  ULTRAM Take 1 tablet (50 mg total) by mouth every 6 (six) hours as needed.   Turmeric 450 MG Caps Take 450 mg by mouth daily.      Verbal and written Discharge instructions given to the patient. Wound care per Discharge AVS Follow-up Information    Festus BarrenJason Genelle Economou, MD Follow up in 3 week(s).   Specialties:  Vascular Surgery, Radiology, Interventional Cardiology Why:  patient may already have appointment around this time which is fine to keep that instead of a new appointment Contact information: [address] [place] [address] [phone]           Signed: Festus BarrenJason Merranda Bolls, MD  08/08/2016, 1:11 PM

## 2016-08-09 ENCOUNTER — Telehealth (INDEPENDENT_AMBULATORY_CARE_PROVIDER_SITE_OTHER): Payer: Self-pay

## 2016-08-09 ENCOUNTER — Encounter (INDEPENDENT_AMBULATORY_CARE_PROVIDER_SITE_OTHER): Payer: Medicare Other

## 2016-08-09 ENCOUNTER — Ambulatory Visit (INDEPENDENT_AMBULATORY_CARE_PROVIDER_SITE_OTHER): Payer: Medicare Other | Admitting: Vascular Surgery

## 2016-08-09 DIAGNOSIS — Z7901 Long term (current) use of anticoagulants: Secondary | ICD-10-CM | POA: Diagnosis not present

## 2016-08-09 DIAGNOSIS — I70221 Atherosclerosis of native arteries of extremities with rest pain, right leg: Secondary | ICD-10-CM | POA: Diagnosis not present

## 2016-08-09 DIAGNOSIS — F0390 Unspecified dementia without behavioral disturbance: Secondary | ICD-10-CM | POA: Diagnosis not present

## 2016-08-09 DIAGNOSIS — I10 Essential (primary) hypertension: Secondary | ICD-10-CM | POA: Diagnosis not present

## 2016-08-09 DIAGNOSIS — E785 Hyperlipidemia, unspecified: Secondary | ICD-10-CM | POA: Diagnosis not present

## 2016-08-09 DIAGNOSIS — Z7982 Long term (current) use of aspirin: Secondary | ICD-10-CM | POA: Diagnosis not present

## 2016-08-09 DIAGNOSIS — Z48812 Encounter for surgical aftercare following surgery on the circulatory system: Secondary | ICD-10-CM | POA: Diagnosis not present

## 2016-08-09 NOTE — Telephone Encounter (Signed)
Ashley Hill called from [facility] wanting to know when to take off the dressing, per [name] P.A. It can be taken off now, she can shower and no bathing. Leave open to air as much as possible. Patient wanted Tramadol so a prescription was written for that.

## 2016-08-11 DIAGNOSIS — Z7982 Long term (current) use of aspirin: Secondary | ICD-10-CM | POA: Diagnosis not present

## 2016-08-11 DIAGNOSIS — F0390 Unspecified dementia without behavioral disturbance: Secondary | ICD-10-CM | POA: Diagnosis not present

## 2016-08-11 DIAGNOSIS — I10 Essential (primary) hypertension: Secondary | ICD-10-CM | POA: Diagnosis not present

## 2016-08-11 DIAGNOSIS — E785 Hyperlipidemia, unspecified: Secondary | ICD-10-CM | POA: Diagnosis not present

## 2016-08-11 DIAGNOSIS — I70221 Atherosclerosis of native arteries of extremities with rest pain, right leg: Secondary | ICD-10-CM | POA: Diagnosis not present

## 2016-08-11 DIAGNOSIS — Z48812 Encounter for surgical aftercare following surgery on the circulatory system: Secondary | ICD-10-CM | POA: Diagnosis not present

## 2016-08-14 DIAGNOSIS — Z7982 Long term (current) use of aspirin: Secondary | ICD-10-CM | POA: Diagnosis not present

## 2016-08-14 DIAGNOSIS — E785 Hyperlipidemia, unspecified: Secondary | ICD-10-CM | POA: Diagnosis not present

## 2016-08-14 DIAGNOSIS — Z48812 Encounter for surgical aftercare following surgery on the circulatory system: Secondary | ICD-10-CM | POA: Diagnosis not present

## 2016-08-14 DIAGNOSIS — I10 Essential (primary) hypertension: Secondary | ICD-10-CM | POA: Diagnosis not present

## 2016-08-14 DIAGNOSIS — I70221 Atherosclerosis of native arteries of extremities with rest pain, right leg: Secondary | ICD-10-CM | POA: Diagnosis not present

## 2016-08-14 DIAGNOSIS — F0390 Unspecified dementia without behavioral disturbance: Secondary | ICD-10-CM | POA: Diagnosis not present

## 2016-08-16 ENCOUNTER — Other Ambulatory Visit: Payer: Self-pay

## 2016-08-16 ENCOUNTER — Telehealth (INDEPENDENT_AMBULATORY_CARE_PROVIDER_SITE_OTHER): Payer: Self-pay

## 2016-08-16 DIAGNOSIS — E785 Hyperlipidemia, unspecified: Secondary | ICD-10-CM | POA: Diagnosis not present

## 2016-08-16 DIAGNOSIS — I70221 Atherosclerosis of native arteries of extremities with rest pain, right leg: Secondary | ICD-10-CM | POA: Diagnosis not present

## 2016-08-16 DIAGNOSIS — Z48812 Encounter for surgical aftercare following surgery on the circulatory system: Secondary | ICD-10-CM | POA: Diagnosis not present

## 2016-08-16 DIAGNOSIS — I10 Essential (primary) hypertension: Secondary | ICD-10-CM | POA: Diagnosis not present

## 2016-08-16 DIAGNOSIS — F0390 Unspecified dementia without behavioral disturbance: Secondary | ICD-10-CM | POA: Diagnosis not present

## 2016-08-16 DIAGNOSIS — Z7982 Long term (current) use of aspirin: Secondary | ICD-10-CM | POA: Diagnosis not present

## 2016-08-16 NOTE — Patient Outreach (Signed)
[facility]) Care Management  08/16/2016  KALII CHESMORE [birth year] [id number]       EMMI-GENERAL DISCHARGE RED ON EMMI ALERT Day # 4 Date: 08/15/16 Red Alert Reason: "Lost interest in things? Yes   Sad/hopeless/anxious/empty? Yes"     Outreach attempt #1 to patient. No answer at present. RN CM left HIPAA compliant voicemail message along with contact info.    Plan: RN CM will make outreach attempt to patient within one business day if no return call from patient.   Antionette Fairy, RN,BSN,CCM [facility] Care Management Telephonic Care Management Coordinator Direct Phone: [phone] Toll Free: [phone] Fax: [phone]

## 2016-08-16 NOTE — Telephone Encounter (Signed)
[facility] nurse called back stating that Ashley Hill refused to go to the ER and was trying to get in the office but we did not have no u/s time available;spoke with KS and she still advise for the patient to go to the ER

## 2016-08-16 NOTE — Telephone Encounter (Signed)
[facility] nurse Rosann Auerbach) called stating that the patient have some bluish discoloration and cool to the touch with right leg according to the the daughter.I spoke with KS and she advise for the patient to go to the ER.

## 2016-08-17 ENCOUNTER — Other Ambulatory Visit: Payer: Self-pay

## 2016-08-17 NOTE — Patient Outreach (Signed)
[facility]) Care Management  08/17/2016  Ashley Hill 1934-05-26 [id number]   EMMI-GENERAL DISCHARGE RED ON EMMI ALERT Day # 4 Date: 08/15/16 Red Alert Reason: "Lost interest in things? Yes   Sad/hopeless/anxious/empty? Yes"     Outreach attempt  #2 to patient. No answer at present and unable to leave message.    Plan: RN CM will send unsuccessful outreach letter and will close case if no response within 10 business days.    Antionette Fairy, RN,BSN,CCM [facility] Care Management Telephonic Care Management Coordinator Direct Phone: [phone] Toll Free: [phone] Fax: [phone]

## 2016-08-18 ENCOUNTER — Encounter (INDEPENDENT_AMBULATORY_CARE_PROVIDER_SITE_OTHER): Payer: Medicare Other | Admitting: Vascular Surgery

## 2016-08-18 ENCOUNTER — Encounter (INDEPENDENT_AMBULATORY_CARE_PROVIDER_SITE_OTHER): Payer: Medicare Other

## 2016-08-18 DIAGNOSIS — Z7982 Long term (current) use of aspirin: Secondary | ICD-10-CM | POA: Diagnosis not present

## 2016-08-18 DIAGNOSIS — I70221 Atherosclerosis of native arteries of extremities with rest pain, right leg: Secondary | ICD-10-CM | POA: Diagnosis not present

## 2016-08-18 DIAGNOSIS — I10 Essential (primary) hypertension: Secondary | ICD-10-CM | POA: Diagnosis not present

## 2016-08-18 DIAGNOSIS — Z48812 Encounter for surgical aftercare following surgery on the circulatory system: Secondary | ICD-10-CM | POA: Diagnosis not present

## 2016-08-18 DIAGNOSIS — F0390 Unspecified dementia without behavioral disturbance: Secondary | ICD-10-CM | POA: Diagnosis not present

## 2016-08-18 DIAGNOSIS — E785 Hyperlipidemia, unspecified: Secondary | ICD-10-CM | POA: Diagnosis not present

## 2016-08-21 ENCOUNTER — Telehealth (INDEPENDENT_AMBULATORY_CARE_PROVIDER_SITE_OTHER): Payer: Self-pay | Admitting: Vascular Surgery

## 2016-08-21 ENCOUNTER — Telehealth (INDEPENDENT_AMBULATORY_CARE_PROVIDER_SITE_OTHER): Payer: Self-pay

## 2016-08-21 DIAGNOSIS — E785 Hyperlipidemia, unspecified: Secondary | ICD-10-CM | POA: Diagnosis not present

## 2016-08-21 DIAGNOSIS — Z7982 Long term (current) use of aspirin: Secondary | ICD-10-CM | POA: Diagnosis not present

## 2016-08-21 DIAGNOSIS — I70221 Atherosclerosis of native arteries of extremities with rest pain, right leg: Secondary | ICD-10-CM | POA: Diagnosis not present

## 2016-08-21 DIAGNOSIS — I10 Essential (primary) hypertension: Secondary | ICD-10-CM | POA: Diagnosis not present

## 2016-08-21 DIAGNOSIS — F0390 Unspecified dementia without behavioral disturbance: Secondary | ICD-10-CM | POA: Diagnosis not present

## 2016-08-21 DIAGNOSIS — Z48812 Encounter for surgical aftercare following surgery on the circulatory system: Secondary | ICD-10-CM | POA: Diagnosis not present

## 2016-08-21 NOTE — Telephone Encounter (Signed)
Pt. Daughter came in on Friday afternoon requesting to pick up a RX for mother that she thought should have been waiting on them. She states that it is not an emergency and she has enough to make it through the weekend. I advised her that there was not a RX waiting and that I would reach out to provider on Monday to get one and she would be called when it is ready. The RX is for Tramadol.

## 2016-08-21 NOTE — Telephone Encounter (Signed)
Prescription has been rewritten, the other prescription was at the front desk for almost 2 weeks so the provider had it shredded.

## 2016-08-21 NOTE — Telephone Encounter (Signed)
Patient called wanting Tramadol pain medication, she also wanted to know why this is not getting better. Her right leg is numb and her foot is white.

## 2016-08-22 DIAGNOSIS — E785 Hyperlipidemia, unspecified: Secondary | ICD-10-CM | POA: Diagnosis not present

## 2016-08-22 DIAGNOSIS — I10 Essential (primary) hypertension: Secondary | ICD-10-CM | POA: Diagnosis not present

## 2016-08-22 DIAGNOSIS — Z7982 Long term (current) use of aspirin: Secondary | ICD-10-CM | POA: Diagnosis not present

## 2016-08-22 DIAGNOSIS — I70221 Atherosclerosis of native arteries of extremities with rest pain, right leg: Secondary | ICD-10-CM | POA: Diagnosis not present

## 2016-08-22 DIAGNOSIS — Z48812 Encounter for surgical aftercare following surgery on the circulatory system: Secondary | ICD-10-CM | POA: Diagnosis not present

## 2016-08-22 DIAGNOSIS — F0390 Unspecified dementia without behavioral disturbance: Secondary | ICD-10-CM | POA: Diagnosis not present

## 2016-08-22 NOTE — Telephone Encounter (Signed)
I attempted to contact the patient to let her know what Dr. Wyn Quaker said regarding her foot color and was unable to make contact. I could not leave a message either.

## 2016-08-22 NOTE — Telephone Encounter (Signed)
She can have more Tramadol.  50 mg Q6 hours PRN pain, dispense thirty with two refills. Her leg is still recovering from surgery. It may always be a little cooler and paler than the other leg, but that is OK.  As long as her pain is tolerable, we will try to let her recover.  There are not a lot of options that would be available at this point only two weeks after surgery.

## 2016-08-23 DIAGNOSIS — F0390 Unspecified dementia without behavioral disturbance: Secondary | ICD-10-CM | POA: Diagnosis not present

## 2016-08-23 DIAGNOSIS — I10 Essential (primary) hypertension: Secondary | ICD-10-CM | POA: Diagnosis not present

## 2016-08-23 DIAGNOSIS — Z7982 Long term (current) use of aspirin: Secondary | ICD-10-CM | POA: Diagnosis not present

## 2016-08-23 DIAGNOSIS — I70221 Atherosclerosis of native arteries of extremities with rest pain, right leg: Secondary | ICD-10-CM | POA: Diagnosis not present

## 2016-08-23 DIAGNOSIS — Z48812 Encounter for surgical aftercare following surgery on the circulatory system: Secondary | ICD-10-CM | POA: Diagnosis not present

## 2016-08-23 DIAGNOSIS — E785 Hyperlipidemia, unspecified: Secondary | ICD-10-CM | POA: Diagnosis not present

## 2016-08-28 DIAGNOSIS — F0390 Unspecified dementia without behavioral disturbance: Secondary | ICD-10-CM | POA: Diagnosis not present

## 2016-08-28 DIAGNOSIS — I10 Essential (primary) hypertension: Secondary | ICD-10-CM | POA: Diagnosis not present

## 2016-08-28 DIAGNOSIS — I70221 Atherosclerosis of native arteries of extremities with rest pain, right leg: Secondary | ICD-10-CM | POA: Diagnosis not present

## 2016-08-28 DIAGNOSIS — E785 Hyperlipidemia, unspecified: Secondary | ICD-10-CM | POA: Diagnosis not present

## 2016-08-28 DIAGNOSIS — Z48812 Encounter for surgical aftercare following surgery on the circulatory system: Secondary | ICD-10-CM | POA: Diagnosis not present

## 2016-08-28 DIAGNOSIS — Z7982 Long term (current) use of aspirin: Secondary | ICD-10-CM | POA: Diagnosis not present

## 2016-08-29 ENCOUNTER — Ambulatory Visit (INDEPENDENT_AMBULATORY_CARE_PROVIDER_SITE_OTHER): Payer: Medicare Other | Admitting: Vascular Surgery

## 2016-08-29 ENCOUNTER — Encounter (INDEPENDENT_AMBULATORY_CARE_PROVIDER_SITE_OTHER): Payer: Self-pay | Admitting: Vascular Surgery

## 2016-08-29 VITALS — BP 124/69 | HR 77 | Resp 16 | Wt 141.0 lb

## 2016-08-29 DIAGNOSIS — E785 Hyperlipidemia, unspecified: Secondary | ICD-10-CM

## 2016-08-29 DIAGNOSIS — I70229 Atherosclerosis of native arteries of extremities with rest pain, unspecified extremity: Secondary | ICD-10-CM

## 2016-08-29 NOTE — Progress Notes (Signed)
Patient ID: Ashley Hill, female   DOB: 01/08/1935, 82 y.o.   MRN: [id number]  Chief Complaint  Patient presents with  . Follow-up    HPI Ashley Hill is a 82 y.o. female.  Patient returns in follow-up after right femoral endarterectomy. She is still having fair bit of numbness and tingling in her feet and toes. She still has pain with walking. Her incision is well-healed. She does not have fever or chills   Past Medical History:  Diagnosis Date  . Dyslipidemia   . Ectopic atrial tachycardia (HCC)   . Environmental allergies   . H/O scarlet fever   . Heart murmur   . HOH (hard of hearing)   . Hyperlipidemia   . Hypertension   . Peripheral vascular disease (HCC)   . 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 Harvie Bridge, [place].  . Transient cerebral ischemia     Past Surgical History:  Procedure Laterality Date  . ABLATION OF DYSRHYTHMIC FOCUS    . AUGMENTATION MAMMAPLASTY Bilateral   . ENDARTERECTOMY FEMORAL Right 08/07/2016   Procedure: ENDARTERECTOMY FEMORAL;  Surgeon: Annice Needy, MD;  Location: [facility];  Service: Vascular;  Laterality: Right;  . JOINT REPLACEMENT     knee  . KNEE ARTHROSCOPY W/ MENISCAL REPAIR     Right   . PERIPHERAL VASCULAR BALLOON ANGIOPLASTY N/A 07/26/2016   Procedure: Peripheral Vascular Balloon Angioplasty;  Surgeon: Annice Needy, MD;  Location: [facility];  Service: Cardiovascular;  Laterality: N/A;  . PERIPHERAL VASCULAR CATHETERIZATION Right 01/11/2015   Procedure: Lower Extremity Angiography;  Surgeon: Annice Needy, MD;  Location: [facility];  Service: Cardiovascular;  Laterality: Right;  . PERIPHERAL VASCULAR CATHETERIZATION Right 06/21/2015   Procedure: Lower Extremity Angiography;  Surgeon: Annice Needy, MD;  Location: [facility];  Service: Cardiovascular;  Laterality: Right;  . PERIPHERAL VASCULAR CATHETERIZATION Right 06/21/2015   Procedure: Lower  Extremity Intervention;  Surgeon: Annice Needy, MD;  Location: [facility];  Service: Cardiovascular;  Laterality: Right;  . PERIPHERAL VASCULAR CATHETERIZATION Right 10/12/2015   Procedure: Lower Extremity Angiography;  Surgeon: Annice Needy, MD;  Location: [facility];  Service: Cardiovascular;  Laterality: Right;  . PERIPHERAL VASCULAR CATHETERIZATION Right 10/12/2015   Procedure: Lower Extremity Intervention;  Surgeon: Annice Needy, MD;  Location: [facility];  Service: Cardiovascular;  Laterality: Right;  . PERIPHERAL VASCULAR CATHETERIZATION Left 02/10/2016   Procedure: Lower Extremity Angiography;  Surgeon: Annice Needy, MD;  Location: [facility];  Service: Cardiovascular;  Laterality: Left;  . PERIPHERAL VASCULAR CATHETERIZATION  02/10/2016   Procedure: Lower Extremity Intervention;  Surgeon: Annice Needy, MD;  Location: [facility];  Service: Cardiovascular;;  . PERIPHERAL VASCULAR CATHETERIZATION Right 02/24/2016   Procedure: Lower Extremity Angiography;  Surgeon: Annice Needy, MD;  Location: [facility];  Service: Cardiovascular;  Laterality: Right;  . SHOULDER CLOSED REDUCTION Right 04/10/2016   Procedure: CLOSED REDUCTION SHOULDER;  Surgeon: Kennedy Bucker, MD;  Location: [facility];  Service: Orthopedics;  Laterality: Right;  . TONSILLECTOMY    . TOTAL KNEE ARTHROPLASTY  2015  . VAGINAL HYSTERECTOMY        Allergies  Allergen Reactions  . Penicillins Anaphylaxis    Has patient had a PCN reaction causing immediate rash, facial/tongue/throat swelling, SOB or lightheadedness with hypotension: Yes Has patient  had a PCN reaction causing severe rash involving mucus membranes or skin necrosis: No Has patient had a PCN reaction that required hospitalization No Has patient had a PCN reaction occurring within the last 10 years: No If all of the above answers are "NO", then may proceed with Cephalosporin use.   . Statins     Cramps in legs  .  Lipitor [Atorvastatin]     Current Outpatient Prescriptions  Medication Sig Dispense Refill  . amLODipine (NORVASC) 5 MG tablet Take 5 mg by mouth daily.    Marland Kitchen apixaban (ELIQUIS) 5 MG TABS tablet Take 1 tablet (5 mg total) by mouth 2 (two) times daily. 60 tablet 2  . aspirin 81 MG tablet Take 81 mg by mouth daily.    Marland Kitchen b complex vitamins tablet Take 1 tablet by mouth daily.    Marland Kitchen CALCIUM PO Take 750 mg by mouth 2 (two) times daily.    . carvedilol (COREG) 25 MG tablet Take 25 mg by mouth 2 (two) times daily with a meal.    . cholecalciferol (VITAMIN D) 1000 units tablet Take 2,000 Units by mouth daily.    . Coenzyme Q10 (COQ10) 50 MG CAPS Take 1 capsule by mouth daily.    . CRESTOR 5 MG tablet TAKE 1 TABLET BY MOUTH EVERY DAY 30 tablet 6  . hydrochlorothiazide (HYDRODIURIL) 25 MG tablet TAKE 1 TABLET BY MOUTH EVERY DAY 30 tablet 3  . traMADol (ULTRAM) 50 MG tablet Take 1 tablet (50 mg total) by mouth every 6 (six) hours as needed. 30 tablet 0  . Turmeric 450 MG CAPS Take 450 mg by mouth daily.     No current facility-administered medications for this visit.         Physical Exam BP 124/69   Pulse 77   Resp 16   Wt 141 lb (64 kg)   BMI 22.76 kg/m  Gen:  WD/WN, NAD Skin: incision C/D/I. Right foot with no ulcerations or infection. Foot is reasonably warm and the capillary refill is fair. Monophasic Doppler signals are present.     Assessment/Plan:  Hyperlipidemia lipid control important in reducing the progression of atherosclerotic disease. Continue statin therapy     Atherosclerosis of artery of extremity with rest pain (HCC) Her perfusion appears improved but certainly not ideal after right femoral endarterectomy. I have recommended she continue her anticoagulation and statin agent. I have asked her to try to increase her activity as much as possible. She does not have any ulcerations or infection. Our only other option for revascularization would be a fem distal bypass  which would be suboptimal at this point. I'll plan to see her back in 2-3 months.      Festus Barren 08/29/2016, 2:19 PM   This note was created with Dragon medical transcription system.  Any errors from dictation are unintentional.

## 2016-08-29 NOTE — Assessment & Plan Note (Signed)
Her perfusion appears improved but certainly not ideal after right femoral endarterectomy. I have recommended she continue her anticoagulation and statin agent. I have asked her to try to increase her activity as much as possible. She does not have any ulcerations or infection. Our only other option for revascularization would be a fem distal bypass which would be suboptimal at this point. I'll plan to see her back in 2-3 months.

## 2016-08-29 NOTE — Assessment & Plan Note (Signed)
lipid control important in reducing the progression of atherosclerotic disease. Continue statin therapy  

## 2016-08-31 ENCOUNTER — Other Ambulatory Visit: Payer: Self-pay

## 2016-08-31 DIAGNOSIS — Z48812 Encounter for surgical aftercare following surgery on the circulatory system: Secondary | ICD-10-CM | POA: Diagnosis not present

## 2016-08-31 DIAGNOSIS — F0390 Unspecified dementia without behavioral disturbance: Secondary | ICD-10-CM | POA: Diagnosis not present

## 2016-08-31 DIAGNOSIS — Z7982 Long term (current) use of aspirin: Secondary | ICD-10-CM | POA: Diagnosis not present

## 2016-08-31 DIAGNOSIS — I10 Essential (primary) hypertension: Secondary | ICD-10-CM | POA: Diagnosis not present

## 2016-08-31 DIAGNOSIS — E785 Hyperlipidemia, unspecified: Secondary | ICD-10-CM | POA: Diagnosis not present

## 2016-08-31 DIAGNOSIS — I70221 Atherosclerosis of native arteries of extremities with rest pain, right leg: Secondary | ICD-10-CM | POA: Diagnosis not present

## 2016-08-31 NOTE — Patient Outreach (Signed)
[facility]) Care Management  08/31/2016  Ashley Hill 13-Aug-1934 [id number]     EMMI-GENERAL DISCHARGE RED ON EMMI ALERT Day # 4 Date: 08/15/16 Red Alert Reason: "Lost interest in things? Yes Sad/hopeless/anxious/empty? Yes"   Multiple attempts to establish contact with patient. No response from letter mailed to patient. Case is being closed at this time.     Plan: RN CM will notify [facility] administrative assistant of case status.   Antionette Fairy, RN,BSN,CCM [facility] Care Management Telephonic Care Management Coordinator Direct Phone: [phone] Toll Free: [phone] Fax: [phone]

## 2016-09-04 ENCOUNTER — Telehealth (INDEPENDENT_AMBULATORY_CARE_PROVIDER_SITE_OTHER): Payer: Self-pay

## 2016-09-04 DIAGNOSIS — E785 Hyperlipidemia, unspecified: Secondary | ICD-10-CM | POA: Diagnosis not present

## 2016-09-04 DIAGNOSIS — F0390 Unspecified dementia without behavioral disturbance: Secondary | ICD-10-CM | POA: Diagnosis not present

## 2016-09-04 DIAGNOSIS — Z7982 Long term (current) use of aspirin: Secondary | ICD-10-CM | POA: Diagnosis not present

## 2016-09-04 DIAGNOSIS — Z48812 Encounter for surgical aftercare following surgery on the circulatory system: Secondary | ICD-10-CM | POA: Diagnosis not present

## 2016-09-04 DIAGNOSIS — I70221 Atherosclerosis of native arteries of extremities with rest pain, right leg: Secondary | ICD-10-CM | POA: Diagnosis not present

## 2016-09-04 DIAGNOSIS — I10 Essential (primary) hypertension: Secondary | ICD-10-CM | POA: Diagnosis not present

## 2016-09-04 NOTE — Telephone Encounter (Signed)
Patient's daughter walked into the office wanting the paperwork from the Eliquis company and samples of Eliquis.

## 2016-09-05 NOTE — Telephone Encounter (Signed)
A message was left on the daughter's voicemail letting her know that the paperwork had been sent in and Eliquis samples for a month will be at the front desk. See notes below.

## 2016-09-06 DIAGNOSIS — F0390 Unspecified dementia without behavioral disturbance: Secondary | ICD-10-CM | POA: Diagnosis not present

## 2016-09-06 DIAGNOSIS — Z7982 Long term (current) use of aspirin: Secondary | ICD-10-CM | POA: Diagnosis not present

## 2016-09-06 DIAGNOSIS — Z48812 Encounter for surgical aftercare following surgery on the circulatory system: Secondary | ICD-10-CM | POA: Diagnosis not present

## 2016-09-06 DIAGNOSIS — I70221 Atherosclerosis of native arteries of extremities with rest pain, right leg: Secondary | ICD-10-CM | POA: Diagnosis not present

## 2016-09-06 DIAGNOSIS — I10 Essential (primary) hypertension: Secondary | ICD-10-CM | POA: Diagnosis not present

## 2016-09-06 DIAGNOSIS — E785 Hyperlipidemia, unspecified: Secondary | ICD-10-CM | POA: Diagnosis not present

## 2016-09-07 ENCOUNTER — Telehealth (INDEPENDENT_AMBULATORY_CARE_PROVIDER_SITE_OTHER): Payer: Self-pay

## 2016-09-07 NOTE — Telephone Encounter (Signed)
Creta Levin from [employer] called stating she wanted additional visit for the patient and needed a verbal. She was given the okay for additional visits.

## 2016-09-08 NOTE — Telephone Encounter (Signed)
Tried to call the patient back to let her know to be as active as possible, and to continue to take her medications. And if her pain worsens, that she should go to the ER. I didn't get an answer, and I did not leave a message.

## 2016-09-08 NOTE — Telephone Encounter (Signed)
There is not a lot more we can do right now for her blood flow.  I would recommend activity as much as tolerated and continue taking her medications.  She can always go to the ER, but not sure that will help much.

## 2016-09-08 NOTE — Telephone Encounter (Signed)
Patient called to state that she is having pain in her right calf. She states that the pain is unbearable and that the pain level feels like a 8 or 9. She says that she had the right leg angio done in March of 2018 and she thought the the angio would help her leg from feeling all of this pain? She wants to know what to do?

## 2016-09-11 DIAGNOSIS — Z48812 Encounter for surgical aftercare following surgery on the circulatory system: Secondary | ICD-10-CM | POA: Diagnosis not present

## 2016-09-11 DIAGNOSIS — Z7982 Long term (current) use of aspirin: Secondary | ICD-10-CM | POA: Diagnosis not present

## 2016-09-11 DIAGNOSIS — F0390 Unspecified dementia without behavioral disturbance: Secondary | ICD-10-CM | POA: Diagnosis not present

## 2016-09-11 DIAGNOSIS — E785 Hyperlipidemia, unspecified: Secondary | ICD-10-CM | POA: Diagnosis not present

## 2016-09-11 DIAGNOSIS — I70221 Atherosclerosis of native arteries of extremities with rest pain, right leg: Secondary | ICD-10-CM | POA: Diagnosis not present

## 2016-09-11 DIAGNOSIS — I10 Essential (primary) hypertension: Secondary | ICD-10-CM | POA: Diagnosis not present

## 2016-09-13 DIAGNOSIS — Z48812 Encounter for surgical aftercare following surgery on the circulatory system: Secondary | ICD-10-CM | POA: Diagnosis not present

## 2016-09-13 DIAGNOSIS — E785 Hyperlipidemia, unspecified: Secondary | ICD-10-CM | POA: Diagnosis not present

## 2016-09-13 DIAGNOSIS — I70221 Atherosclerosis of native arteries of extremities with rest pain, right leg: Secondary | ICD-10-CM | POA: Diagnosis not present

## 2016-09-13 DIAGNOSIS — F0390 Unspecified dementia without behavioral disturbance: Secondary | ICD-10-CM | POA: Diagnosis not present

## 2016-09-13 DIAGNOSIS — Z7982 Long term (current) use of aspirin: Secondary | ICD-10-CM | POA: Diagnosis not present

## 2016-09-13 DIAGNOSIS — I10 Essential (primary) hypertension: Secondary | ICD-10-CM | POA: Diagnosis not present

## 2016-09-15 ENCOUNTER — Telehealth (INDEPENDENT_AMBULATORY_CARE_PROVIDER_SITE_OTHER): Payer: Self-pay

## 2016-09-15 DIAGNOSIS — R7309 Other abnormal glucose: Secondary | ICD-10-CM | POA: Diagnosis not present

## 2016-09-15 DIAGNOSIS — E782 Mixed hyperlipidemia: Secondary | ICD-10-CM | POA: Diagnosis not present

## 2016-09-15 DIAGNOSIS — Z1211 Encounter for screening for malignant neoplasm of colon: Secondary | ICD-10-CM | POA: Diagnosis not present

## 2016-09-15 DIAGNOSIS — R8290 Unspecified abnormal findings in urine: Secondary | ICD-10-CM | POA: Diagnosis not present

## 2016-09-15 DIAGNOSIS — Z79899 Other long term (current) drug therapy: Secondary | ICD-10-CM | POA: Diagnosis not present

## 2016-09-15 DIAGNOSIS — E559 Vitamin D deficiency, unspecified: Secondary | ICD-10-CM | POA: Diagnosis not present

## 2016-09-15 DIAGNOSIS — I10 Essential (primary) hypertension: Secondary | ICD-10-CM | POA: Diagnosis not present

## 2016-09-15 DIAGNOSIS — R413 Other amnesia: Secondary | ICD-10-CM | POA: Diagnosis not present

## 2016-09-15 DIAGNOSIS — Z8639 Personal history of other endocrine, nutritional and metabolic disease: Secondary | ICD-10-CM | POA: Diagnosis not present

## 2016-09-15 DIAGNOSIS — I70219 Atherosclerosis of native arteries of extremities with intermittent claudication, unspecified extremity: Secondary | ICD-10-CM | POA: Diagnosis not present

## 2016-09-15 DIAGNOSIS — Z1329 Encounter for screening for other suspected endocrine disorder: Secondary | ICD-10-CM | POA: Diagnosis not present

## 2016-09-15 NOTE — Telephone Encounter (Signed)
Pattricia BossAnnie from [facility] called stating the patient discharged nursing stating she did not need them any longer. She also stated that although they felt it was unsafe she was driving as well.

## 2016-09-18 DIAGNOSIS — Z48812 Encounter for surgical aftercare following surgery on the circulatory system: Secondary | ICD-10-CM | POA: Diagnosis not present

## 2016-09-18 DIAGNOSIS — I10 Essential (primary) hypertension: Secondary | ICD-10-CM | POA: Diagnosis not present

## 2016-09-18 DIAGNOSIS — E785 Hyperlipidemia, unspecified: Secondary | ICD-10-CM | POA: Diagnosis not present

## 2016-09-18 DIAGNOSIS — F0390 Unspecified dementia without behavioral disturbance: Secondary | ICD-10-CM | POA: Diagnosis not present

## 2016-09-18 DIAGNOSIS — I70221 Atherosclerosis of native arteries of extremities with rest pain, right leg: Secondary | ICD-10-CM | POA: Diagnosis not present

## 2016-09-18 DIAGNOSIS — Z7982 Long term (current) use of aspirin: Secondary | ICD-10-CM | POA: Diagnosis not present

## 2016-09-21 ENCOUNTER — Other Ambulatory Visit: Payer: Self-pay | Admitting: Internal Medicine

## 2016-09-21 DIAGNOSIS — Z1231 Encounter for screening mammogram for malignant neoplasm of breast: Secondary | ICD-10-CM

## 2016-09-21 DIAGNOSIS — E538 Deficiency of other specified B group vitamins: Secondary | ICD-10-CM | POA: Diagnosis not present

## 2016-09-22 DIAGNOSIS — Z1211 Encounter for screening for malignant neoplasm of colon: Secondary | ICD-10-CM | POA: Diagnosis not present

## 2016-09-28 DIAGNOSIS — E538 Deficiency of other specified B group vitamins: Secondary | ICD-10-CM | POA: Diagnosis not present

## 2016-10-03 ENCOUNTER — Ambulatory Visit (INDEPENDENT_AMBULATORY_CARE_PROVIDER_SITE_OTHER): Payer: Medicare Other | Admitting: Vascular Surgery

## 2016-10-03 ENCOUNTER — Encounter (INDEPENDENT_AMBULATORY_CARE_PROVIDER_SITE_OTHER): Payer: Medicare Other

## 2016-10-04 DIAGNOSIS — E538 Deficiency of other specified B group vitamins: Secondary | ICD-10-CM | POA: Diagnosis not present

## 2016-10-12 DIAGNOSIS — E538 Deficiency of other specified B group vitamins: Secondary | ICD-10-CM | POA: Diagnosis not present

## 2016-10-13 ENCOUNTER — Other Ambulatory Visit: Payer: Self-pay | Admitting: Internal Medicine

## 2016-10-13 DIAGNOSIS — Z1231 Encounter for screening mammogram for malignant neoplasm of breast: Secondary | ICD-10-CM

## 2016-10-24 ENCOUNTER — Telehealth (INDEPENDENT_AMBULATORY_CARE_PROVIDER_SITE_OTHER): Payer: Self-pay

## 2016-10-24 NOTE — Telephone Encounter (Signed)
Called patient and let her know that I will have samples for her at the front desk and she was happy that she had spoken with [facility] and just needed a paper filled out and she would get her medications sent to her.

## 2016-10-24 NOTE — Telephone Encounter (Signed)
Please give the patient two weeks of samples. Will discuss with Dr. Wyn Quakerew when he returns to the office about possible transition to Coumadin.

## 2016-10-24 NOTE — Telephone Encounter (Signed)
Patient called stating [facility] not [facility] for her Eliquis and that she has only 2 pills left so she wanted to know can she have samples and should she be put on Coumadin due to the fact she cannot afford the Eliquis.

## 2016-10-25 ENCOUNTER — Ambulatory Visit
Admission: RE | Admit: 2016-10-25 | Discharge: 2016-10-25 | Disposition: A | Discharge status: Home / Self Care | Payer: Medicare Other | Source: Ambulatory Visit | Attending: Internal Medicine | Admitting: Internal Medicine

## 2016-10-25 DIAGNOSIS — Z1231 Encounter for screening mammogram for malignant neoplasm of breast: Secondary | ICD-10-CM

## 2016-10-25 DIAGNOSIS — I70219 Atherosclerosis of native arteries of extremities with intermittent claudication, unspecified extremity: Secondary | ICD-10-CM | POA: Diagnosis not present

## 2016-10-25 DIAGNOSIS — E782 Mixed hyperlipidemia: Secondary | ICD-10-CM | POA: Diagnosis not present

## 2016-10-25 DIAGNOSIS — I10 Essential (primary) hypertension: Secondary | ICD-10-CM | POA: Diagnosis not present

## 2016-10-25 DIAGNOSIS — N390 Urinary tract infection, site not specified: Secondary | ICD-10-CM | POA: Diagnosis not present

## 2016-11-13 DIAGNOSIS — E538 Deficiency of other specified B group vitamins: Secondary | ICD-10-CM | POA: Diagnosis not present

## 2016-11-14 ENCOUNTER — Ambulatory Visit (INDEPENDENT_AMBULATORY_CARE_PROVIDER_SITE_OTHER): Payer: Medicare Other | Admitting: Vascular Surgery

## 2016-11-14 ENCOUNTER — Ambulatory Visit (INDEPENDENT_AMBULATORY_CARE_PROVIDER_SITE_OTHER): Payer: Medicare Other

## 2016-11-14 ENCOUNTER — Encounter (INDEPENDENT_AMBULATORY_CARE_PROVIDER_SITE_OTHER): Payer: Self-pay | Admitting: Vascular Surgery

## 2016-11-14 VITALS — BP 141/75 | HR 82 | Resp 16 | Wt 147.0 lb

## 2016-11-14 DIAGNOSIS — G309 Alzheimer's disease, unspecified: Secondary | ICD-10-CM | POA: Diagnosis not present

## 2016-11-14 DIAGNOSIS — I10 Essential (primary) hypertension: Secondary | ICD-10-CM

## 2016-11-14 DIAGNOSIS — I6523 Occlusion and stenosis of bilateral carotid arteries: Secondary | ICD-10-CM | POA: Diagnosis not present

## 2016-11-14 DIAGNOSIS — F0280 Dementia in other diseases classified elsewhere without behavioral disturbance: Secondary | ICD-10-CM | POA: Diagnosis not present

## 2016-11-14 DIAGNOSIS — I70229 Atherosclerosis of native arteries of extremities with rest pain, unspecified extremity: Secondary | ICD-10-CM

## 2016-11-14 DIAGNOSIS — E785 Hyperlipidemia, unspecified: Secondary | ICD-10-CM | POA: Diagnosis not present

## 2016-11-14 DIAGNOSIS — I739 Peripheral vascular disease, unspecified: Secondary | ICD-10-CM | POA: Diagnosis not present

## 2016-11-14 NOTE — Progress Notes (Signed)
MRN : [id number]  Ashley Hill is a 82 y.o. ([birth year]) female who presents with chief complaint of  Chief Complaint  Patient presents with  . Re-evaluation  .  History of Present Illness: Patient returns today in follow up of PAD. She continues to have some numbness on the ball of her foot and some rubor with dependency she remains active and is walking a fair bit. She does have claudication symptoms. Her ABIs today are basically the same at 0.41 on the right and 0.90 on the left. She has a known right SFA and popliteal artery occlusion and some moderately elevated velocities in the proximal left superficial femoral artery above the previously placed stent. Her right femoral endarterectomy site is well-healed. She does not have ulceration or infection.  Current Outpatient Prescriptions  Medication Sig Dispense Refill  . amLODipine (NORVASC) 5 MG tablet Take 5 mg by mouth daily.    Marland Kitchen apixaban (ELIQUIS) 5 MG TABS tablet Take 1 tablet (5 mg total) by mouth 2 (two) times daily. 60 tablet 2  . aspirin 81 MG tablet Take 81 mg by mouth daily.    Marland Kitchen b complex vitamins tablet Take 1 tablet by mouth daily.    Marland Kitchen CALCIUM PO Take 750 mg by mouth 2 (two) times daily.    . carvedilol (COREG) 25 MG tablet Take 25 mg by mouth 2 (two) times daily with a meal.    . cholecalciferol (VITAMIN D) 1000 units tablet Take 2,000 Units by mouth daily.    . Coenzyme Q10 (COQ10) 50 MG CAPS Take 1 capsule by mouth daily.    . CRESTOR 5 MG tablet TAKE 1 TABLET BY MOUTH EVERY DAY 30 tablet 6  . hydrochlorothiazide (HYDRODIURIL) 25 MG tablet TAKE 1 TABLET BY MOUTH EVERY DAY 30 tablet 3  . Turmeric 450 MG CAPS Take 450 mg by mouth daily.    . traMADol (ULTRAM) 50 MG tablet Take 1 tablet (50 mg total) by mouth every 6 (six) hours as needed. (Patient not taking: Reported on 11/14/2016) 30 tablet 0   No current facility-administered medications for this visit.     Past Medical History:  Diagnosis Date  . Dyslipidemia   .  Ectopic atrial tachycardia (HCC)   . Environmental allergies   . H/O scarlet fever   . Heart murmur   . HOH (hard of hearing)   . Hyperlipidemia   . Hypertension   . Peripheral vascular disease (HCC)   . 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 Harvie Bridge, [place].  . Transient cerebral ischemia     Past Surgical History:  Procedure Laterality Date  . ABLATION OF DYSRHYTHMIC FOCUS    . AUGMENTATION MAMMAPLASTY Bilateral   . ENDARTERECTOMY FEMORAL Right 08/07/2016   Procedure: ENDARTERECTOMY FEMORAL;  Surgeon: Annice Needy, MD;  Location: [facility];  Service: Vascular;  Laterality: Right;  . JOINT REPLACEMENT     knee  . KNEE ARTHROSCOPY W/ MENISCAL REPAIR     Right   . PERIPHERAL VASCULAR BALLOON ANGIOPLASTY N/A 07/26/2016   Procedure: Peripheral Vascular Balloon Angioplasty;  Surgeon: Annice Needy, MD;  Location: [facility];  Service: Cardiovascular;  Laterality: N/A;  . PERIPHERAL VASCULAR CATHETERIZATION Right 01/11/2015   Procedure: Lower Extremity Angiography;  Surgeon: Annice Needy, MD;  Location: [facility];  Service: Cardiovascular;  Laterality: Right;  . PERIPHERAL VASCULAR CATHETERIZATION Right 06/21/2015   Procedure:  Lower Extremity Angiography;  Surgeon: Annice Needy, MD;  Location: [facility];  Service: Cardiovascular;  Laterality: Right;  . PERIPHERAL VASCULAR CATHETERIZATION Right 06/21/2015   Procedure: Lower Extremity Intervention;  Surgeon: Annice Needy, MD;  Location: [facility];  Service: Cardiovascular;  Laterality: Right;  . PERIPHERAL VASCULAR CATHETERIZATION Right 10/12/2015   Procedure: Lower Extremity Angiography;  Surgeon: Annice Needy, MD;  Location: [facility];  Service: Cardiovascular;  Laterality: Right;  . PERIPHERAL VASCULAR CATHETERIZATION Right 10/12/2015   Procedure: Lower Extremity Intervention;  Surgeon: Annice Needy, MD;  Location: [facility];  Service: Cardiovascular;  Laterality: Right;  . PERIPHERAL VASCULAR CATHETERIZATION Left 02/10/2016   Procedure: Lower Extremity Angiography;  Surgeon: Annice Needy, MD;  Location: [facility];  Service: Cardiovascular;  Laterality: Left;  . PERIPHERAL VASCULAR CATHETERIZATION  02/10/2016   Procedure: Lower Extremity Intervention;  Surgeon: Annice Needy, MD;  Location: [facility];  Service: Cardiovascular;;  . PERIPHERAL VASCULAR CATHETERIZATION Right 02/24/2016   Procedure: Lower Extremity Angiography;  Surgeon: Annice Needy, MD;  Location: [facility];  Service: Cardiovascular;  Laterality: Right;  . SHOULDER CLOSED REDUCTION Right 04/10/2016   Procedure: CLOSED REDUCTION SHOULDER;  Surgeon: Kennedy Bucker, MD;  Location: [facility];  Service: Orthopedics;  Laterality: Right;  . TONSILLECTOMY    . TOTAL KNEE ARTHROPLASTY  2015  . VAGINAL HYSTERECTOMY      Social History Social History  Substance Use Topics  . Smoking status: Former Smoker    Packs/day: 1.00    Years: 10.00    Types: Cigarettes    Quit date: 02/23/1981  . Smokeless tobacco: Never Used  . Alcohol use No     Comment: rare occ    Family History Family History  Problem Relation Age of Onset  . Alcohol abuse Father   . Cancer Father   . Diabetes Paternal Grandfather   . Breast cancer Neg Hx     Allergies  Allergen Reactions  . Penicillins Anaphylaxis    Has patient had a PCN reaction causing immediate rash, facial/tongue/throat swelling, SOB or lightheadedness with hypotension: Yes Has patient had a PCN reaction causing severe rash involving mucus membranes or skin necrosis: No Has patient had a PCN reaction that required hospitalization No Has patient had a PCN reaction occurring within the last 10 years: No If all of the above answers are "NO", then may proceed with Cephalosporin use.   . Statins     Cramps in legs  . Lipitor [Atorvastatin]      REVIEW OF SYSTEMS (Negative  unless checked)  Constitutional: [] Weight loss  [] Fever  [] Chills Cardiac: [] Chest pain   [] Chest pressure   [] Palpitations   [] Shortness of breath when laying flat   [] Shortness of breath at rest   [] Shortness of breath with exertion. Vascular:  [x] Pain in legs with walking   [] Pain in legs at rest   [] Pain in legs when laying flat   [x] Claudication   [] Pain in feet when walking  [] Pain in feet at rest  [] Pain in feet when laying flat   [] History of DVT   [] Phlebitis   [] Swelling in legs   [] Varicose veins   [] Non-healing ulcers Pulmonary:   [] Uses home oxygen   [] Productive cough   [] Hemoptysis   [] Wheeze  [] COPD   [] Asthma Neurologic:  [] Dizziness  [] Blackouts   [] Seizures   [] History of stroke   [] History of TIA  []   Aphasia   [] Temporary blindness   [] Dysphagia   [] Weakness or numbness in arms   [x] Weakness or numbness in legs  X memory loss worsening Musculoskeletal:  [] Arthritis   [] Joint swelling   [] Joint pain   [] Low back pain Hematologic:  [] Easy bruising  [] Easy bleeding   [] Hypercoagulable state   [] Anemic   Gastrointestinal:  [] Blood in stool   [] Vomiting blood  [] Gastroesophageal reflux/heartburn   [] Abdominal pain Genitourinary:  [] Chronic kidney disease   [] Difficult urination  [] Frequent urination  [] Burning with urination   [] Hematuria Skin:  [] Rashes   [] Ulcers   [] Wounds Psychological:  [x] History of anxiety   []  History of major depression.  Physical Examination  BP (!) 141/75   Pulse 82   Resp 16   Wt 66.7 kg (147 lb)   BMI 23.73 kg/m  Gen:  WD/WN, NAD. Pleasantly confused. Head: [place]/AT, No temporalis wasting. Ear/Nose/Throat: Hearing grossly intact, nares w/o erythema or drainage, trachea midline Eyes: Conjunctiva clear. Sclera non-icteric Neck: Supple.  No JVD.  Pulmonary:  Good air movement, no use of accessory muscles.  Cardiac: RRR, normal S1, S2 Vascular:  Vessel Right Left  Radial Palpable Palpable  Ulnar Palpable Palpable  Brachial Palpable Palpable    Carotid Palpable, without bruit Palpable, without bruit  Aorta Not palpable N/A  Femoral Palpable Palpable  Popliteal Not Palpable 1+ Palpable  PT Not Palpable Palpable  DP Not Palpable 1+ Palpable    Musculoskeletal: M/S 5/5 throughout.  No deformity or atrophy. 1+ edema. Right leg has some rubor on dependency with slightly sluggish capillary refill but no ulceration or infection. Neurologic: Sensation grossly intact in extremities.  Symmetrical.  Speech is fluent.  Psychiatric: Judgment seems fair. Pleasantly confused Dermatologic: No rashes or ulcers noted.  No cellulitis or open wounds.       Labs Recent Results (from the past 2160 hour(s))  VAS US LOWER EXTREMITY ARTERIAL DUPLEX     Status: None (In process)   Collection Time: 11/14/16  9:00 AM  Result Value Ref Range   Left super femoral prox sys PSV -130 cm/s   Left super femoral dist sys PSV -99 cm/s   Left popliteal prox sys PSV 72 cm/s   Left popliteal dist sys PSV -86 cm/s   Right peroneal sys PSV 21 cm/s   Left ant tibial distal sys 64 cm/s   left post tibial dist sys 34 cm/s   LEFT PERO DIST SYS 37.00 cm/s   RIGHT ANT DIST TIBAL SYS PSV 39 cm/s    Radiology Mm Digital Screening W/ Implants Bilateral  Result Date: 10/31/2016 CLINICAL DATA:  Screening. EXAM: DIGITAL SCREENING BILATERAL MAMMOGRAM WITH IMPLANTS AND CAD The patient has collapsed retroglandular implants. Standard and implant displaced views were performed. COMPARISON:  Previous exam(s). ACR Breast Density Category b: There are scattered areas of fibroglandular density. FINDINGS: There are no findings suspicious for malignancy. Images were processed with CAD. IMPRESSION: No mammographic evidence of malignancy. A result letter of this screening mammogram will be mailed directly to the patient. RECOMMENDATION: Screening mammogram in one year. (Code:SM-B-01Y) BI-RADS CATEGORY  1:  Negative. Electronically Signed   By: Harmon PierJeffrey  Hu M.D.   On: 10/31/2016 15:48      Assessment/Plan  Hypertension blood pressure control important in reducing the progression of atherosclerotic disease. On appropriate oral medications.   Dementia in Alzheimer's disease Seems to be worsening  Hyperlipidemia lipid control important in reducing the progression of atherosclerotic disease. Continue statin therapy   Intermittent claudication (HCC)  Her ABIs today are basically the same at 0.41 on the right and 0.90 on the left. She has a known right SFA and popliteal artery occlusion and some moderately elevated velocities in the proximal left superficial femoral artery above the previously placed stent. Her right femoral endarterectomy site is well-healed. She does still have some claudication symptoms but I don't think there are very good options for making this better at this point. She does not have any immediate limb threatening symptoms and I would recommend continued anticoagulation as well as continuation of aspirin and Crestor. I will plan to see her back in 4-6 months with noninvasive studies or sooner if problems develop in the interim.    Festus Barren, MD  11/14/2016 10:51 AM    This note was created with Dragon medical transcription system.  Any errors from dictation are purely unintentional

## 2016-11-14 NOTE — Assessment & Plan Note (Signed)
blood pressure control important in reducing the progression of atherosclerotic disease. On appropriate oral medications.  

## 2016-11-14 NOTE — Assessment & Plan Note (Signed)
Her ABIs today are basically the same at 0.41 on the right and 0.90 on the left. She has a known right SFA and popliteal artery occlusion and some moderately elevated velocities in the proximal left superficial femoral artery above the previously placed stent. Her right femoral endarterectomy site is well-healed. She does still have some claudication symptoms but I don't think there are very good options for making this better at this point. She does not have any immediate limb threatening symptoms and I would recommend continued anticoagulation as well as continuation of aspirin and Crestor. I will plan to see her back in 4-6 months with noninvasive studies or sooner if problems develop in the interim.

## 2016-11-14 NOTE — Assessment & Plan Note (Signed)
Seems to be worsening

## 2016-11-14 NOTE — Assessment & Plan Note (Signed)
lipid control important in reducing the progression of atherosclerotic disease. Continue statin therapy  

## 2016-11-21 ENCOUNTER — Other Ambulatory Visit (HOSPITAL_COMMUNITY): Payer: Self-pay | Admitting: Neurology

## 2016-11-21 DIAGNOSIS — M79605 Pain in left leg: Secondary | ICD-10-CM | POA: Diagnosis not present

## 2016-11-21 DIAGNOSIS — R413 Other amnesia: Secondary | ICD-10-CM

## 2016-11-21 DIAGNOSIS — M79604 Pain in right leg: Secondary | ICD-10-CM | POA: Diagnosis not present

## 2016-11-21 DIAGNOSIS — R410 Disorientation, unspecified: Secondary | ICD-10-CM

## 2016-11-28 ENCOUNTER — Ambulatory Visit (HOSPITAL_COMMUNITY)
Admission: RE | Admit: 2016-11-28 | Discharge: 2016-11-28 | Disposition: A | Discharge status: Home / Self Care | Payer: Medicare Other | Source: Ambulatory Visit | Attending: Neurology | Admitting: Neurology

## 2016-11-28 DIAGNOSIS — H748X2 Other specified disorders of left middle ear and mastoid: Secondary | ICD-10-CM | POA: Insufficient documentation

## 2016-11-28 DIAGNOSIS — G319 Degenerative disease of nervous system, unspecified: Secondary | ICD-10-CM | POA: Insufficient documentation

## 2016-11-28 DIAGNOSIS — I6782 Cerebral ischemia: Secondary | ICD-10-CM | POA: Diagnosis not present

## 2016-11-28 DIAGNOSIS — R410 Disorientation, unspecified: Secondary | ICD-10-CM

## 2016-11-28 DIAGNOSIS — R413 Other amnesia: Secondary | ICD-10-CM | POA: Diagnosis not present

## 2016-12-26 DIAGNOSIS — I70219 Atherosclerosis of native arteries of extremities with intermittent claudication, unspecified extremity: Secondary | ICD-10-CM | POA: Diagnosis not present

## 2016-12-26 DIAGNOSIS — G894 Chronic pain syndrome: Secondary | ICD-10-CM | POA: Diagnosis not present

## 2016-12-26 DIAGNOSIS — I10 Essential (primary) hypertension: Secondary | ICD-10-CM | POA: Diagnosis not present

## 2017-01-10 DIAGNOSIS — G608 Other hereditary and idiopathic neuropathies: Secondary | ICD-10-CM | POA: Diagnosis not present

## 2017-01-22 DIAGNOSIS — R413 Other amnesia: Secondary | ICD-10-CM | POA: Diagnosis not present

## 2017-01-22 DIAGNOSIS — G608 Other hereditary and idiopathic neuropathies: Secondary | ICD-10-CM | POA: Diagnosis not present

## 2017-01-24 DIAGNOSIS — I10 Essential (primary) hypertension: Secondary | ICD-10-CM | POA: Diagnosis not present

## 2017-01-24 DIAGNOSIS — Z79899 Other long term (current) drug therapy: Secondary | ICD-10-CM | POA: Diagnosis not present

## 2017-01-24 DIAGNOSIS — E559 Vitamin D deficiency, unspecified: Secondary | ICD-10-CM | POA: Diagnosis not present

## 2017-01-24 DIAGNOSIS — E538 Deficiency of other specified B group vitamins: Secondary | ICD-10-CM | POA: Diagnosis not present

## 2017-01-24 DIAGNOSIS — R7309 Other abnormal glucose: Secondary | ICD-10-CM | POA: Diagnosis not present

## 2017-01-24 DIAGNOSIS — E782 Mixed hyperlipidemia: Secondary | ICD-10-CM | POA: Diagnosis not present

## 2017-03-09 DIAGNOSIS — I6523 Occlusion and stenosis of bilateral carotid arteries: Secondary | ICD-10-CM | POA: Diagnosis not present

## 2017-03-09 DIAGNOSIS — I10 Essential (primary) hypertension: Secondary | ICD-10-CM | POA: Diagnosis not present

## 2017-03-09 DIAGNOSIS — R569 Unspecified convulsions: Secondary | ICD-10-CM | POA: Diagnosis not present

## 2017-03-23 ENCOUNTER — Encounter (INDEPENDENT_AMBULATORY_CARE_PROVIDER_SITE_OTHER): Payer: Self-pay | Admitting: Vascular Surgery

## 2017-03-23 ENCOUNTER — Ambulatory Visit (INDEPENDENT_AMBULATORY_CARE_PROVIDER_SITE_OTHER): Payer: Medicare Other | Admitting: Vascular Surgery

## 2017-03-23 VITALS — BP 180/90 | HR 92 | Resp 15 | Ht 66.0 in | Wt 147.0 lb

## 2017-03-23 DIAGNOSIS — E785 Hyperlipidemia, unspecified: Secondary | ICD-10-CM

## 2017-03-23 DIAGNOSIS — I739 Peripheral vascular disease, unspecified: Secondary | ICD-10-CM | POA: Diagnosis not present

## 2017-03-23 DIAGNOSIS — I6523 Occlusion and stenosis of bilateral carotid arteries: Secondary | ICD-10-CM

## 2017-03-23 NOTE — Progress Notes (Signed)
Subjective:    Patient ID: Ashley Hill, female    DOB: November 03, 1934, 82 y.o.   MRN: [id number] Chief Complaint  Patient presents with  . Follow-up    Carotid stenosis, now having seizures   Patient is known to the practice.  She presents earlier than her December 2018 appointment at the request of Dr. Judithann SheenSparks for evaluation of carotid artery disease.  She endorses a history of undergoing a seizure during a recent vacation in [place].  She was placed on Keppra.  She has not had a seizure since.  Dr. Judithann SheenSparks would like her to be evaluated for carotid artery disease.  The patient denies any amaurosis fugax or neurological symptoms.  Patient denies any fever, nausea or vomiting.   Review of Systems  Constitutional: Negative.   HENT: Negative.   Eyes: Negative.   Respiratory: Negative.   Cardiovascular: Negative.   Gastrointestinal: Negative.   Endocrine: Negative.   Genitourinary: Negative.   Musculoskeletal: Negative.   Skin: Negative.   Allergic/Immunologic: Negative.   Neurological: Positive for seizures.  Hematological: Negative.   Psychiatric/Behavioral: Negative.       Objective:   Physical Exam  Constitutional: She is oriented to person, place, and time. She appears well-developed and well-nourished. No distress.  HENT:  Head: Normocephalic and atraumatic.  Eyes: Conjunctivae are normal. Pupils are equal, round, and reactive to light.  Neck: Normal range of motion.  No carotid bruits noted on exam  Cardiovascular: Normal rate, regular rhythm and normal heart sounds.  Pulses:      Radial pulses are 2+ on the right side, and 2+ on the left side.  Patient would hard to palpate pedal pulses.  Pulmonary/Chest: Effort normal and breath sounds normal.  Musculoskeletal: Normal range of motion. She exhibits no edema.  Neurological: She is alert and oriented to person, place, and time.  Skin: Skin is warm and dry. She is not diaphoretic.  Psychiatric: She has a normal mood and  affect. Her behavior is normal. Judgment and thought content normal.  Vitals reviewed.  BP (!) 180/90 (BP Location: Right Arm, Patient Position: Sitting)   Pulse 92   Resp 15   Ht 5\' 6"  (1.676 m)   Wt 147 lb (66.7 kg)   BMI 23.73 kg/m   Past Medical History:  Diagnosis Date  . Dyslipidemia   . Ectopic atrial tachycardia (HCC)   . Environmental allergies   . H/O scarlet fever   . Heart murmur   . HOH (hard of hearing)   . Hyperlipidemia   . Hypertension   . Peripheral vascular disease (HCC)   . 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 Harvie BridgeSagi [place]  . Transient cerebral ischemia    Social History   Socioeconomic History  . Marital status: Divorced    Spouse name: Not on file  . Number of children: Not on file  . Years of education: Not on file  . Highest education level: Not on file  Social Needs  . Financial resource strain: Not on file  . Food insecurity - worry: Not on file  . Food insecurity - inability: Not on file  . Transportation needs - medical: Not on file  . Transportation needs - non-medical: Not on file  Occupational History  . Not on file  Tobacco Use  . Smoking status: Former Smoker    Packs/day: 1.00    Years: 10.00    Pack years:  10.00    Types: Cigarettes    Last attempt to quit: 02/23/1981    Years since quitting: 36.1  . Smokeless tobacco: Never Used  Substance and Sexual Activity  . Alcohol use: No    Comment: rare occ  . Drug use: No  . Sexual activity: No  Other Topics Concern  . Not on file  Social History Narrative   Does not have a living will.    Wants daughter to be HPOA.   Does want CPR but no prolonged life support.   Past Surgical History:  Procedure Laterality Date  . ABLATION OF DYSRHYTHMIC FOCUS    . AUGMENTATION MAMMAPLASTY Bilateral   . JOINT REPLACEMENT     knee  . KNEE ARTHROSCOPY W/ MENISCAL REPAIR     Right   . TONSILLECTOMY    . TOTAL  KNEE ARTHROPLASTY  2015  . VAGINAL HYSTERECTOMY     Family History  Problem Relation Age of Onset  . Alcohol abuse Father   . Cancer Father   . Diabetes Paternal Grandfather   . Breast cancer Neg Hx    Allergies  Allergen Reactions  . Penicillins Anaphylaxis    Has patient had a PCN reaction causing immediate rash, facial/tongue/throat swelling, SOB or lightheadedness with hypotension: Yes Has patient had a PCN reaction causing severe rash involving mucus membranes or skin necrosis: No Has patient had a PCN reaction that required hospitalization No Has patient had a PCN reaction occurring within the last 10 years: No If all of the above answers are "NO", then may proceed with Cephalosporin use.   . Statins     Cramps in legs  . Lipitor [Atorvastatin]       Assessment & Plan:  Patient is known to the practice.  She presents earlier than her December 2018 appointment at the request of Dr. Judithann Sheen for evaluation of carotid artery disease.  She endorses a history of undergoing a seizure during a recent vacation in [place].  She was placed on Keppra.  She has not had a seizure since.  Dr. Judithann Sheen would like her to be evaluated for carotid artery disease.  The patient denies any amaurosis fugax or neurological symptoms.  Patient denies any fever, nausea or vomiting.  1. Bilateral carotid artery stenosis - New Presents today asymptomatic. Physical exam is unremarkable Patient with unexplained seizure activity.  Dr. Judithann Sheen would like to rule out carotid artery stenosis as a contributing factor. The patient is to follow-up and undergo a bilateral carotid duplex in the near future at her convenience  - VAS US CAROTID; Future  2. Hyperlipidemia, unspecified hyperlipidemia type - Stable Encouraged good control as its slows the progression of atherosclerotic disease  3. PAD (peripheral artery disease) (HCC) - Stable Patient with mild claudication-like symptoms to the right lower extremity At  this time, they are not lifestyle limiting. Patient has an appointment in December 2018 with Dr. Wyn Quaker The patient is to call sooner if she should experience any worsening symptoms. Expresses her understanding.  Current Outpatient Medications on File Prior to Visit  Medication Sig Dispense Refill  . amLODipine (NORVASC) 5 MG tablet Take 5 mg by mouth daily.    Marland Kitchen apixaban (ELIQUIS) 5 MG TABS tablet Take 1 tablet (5 mg total) by mouth 2 (two) times daily. 60 tablet 2  . aspirin 81 MG tablet Take 81 mg by mouth daily.    Marland Kitchen b complex vitamins tablet Take 1 tablet by mouth daily.    Marland Kitchen CALCIUM  PO Take 750 mg by mouth 2 (two) times daily.    . carvedilol (COREG) 25 MG tablet Take 25 mg by mouth 2 (two) times daily with a meal.    . cholecalciferol (VITAMIN D) 1000 units tablet Take 2,000 Units by mouth daily.    . Coenzyme Q10 (COQ10) 50 MG CAPS Take 1 capsule by mouth daily.    . CRESTOR 5 MG tablet TAKE 1 TABLET BY MOUTH EVERY DAY 30 tablet 6  . hydrochlorothiazide (HYDRODIURIL) 25 MG tablet TAKE 1 TABLET BY MOUTH EVERY DAY 30 tablet 3  . levETIRAcetam (KEPPRA) 500 MG tablet Take 500 mg 2 (two) times daily by mouth.    . traMADol (ULTRAM) 50 MG tablet Take 1 tablet (50 mg total) by mouth every 6 (six) hours as needed. 30 tablet 0  . Turmeric 450 MG CAPS Take 450 mg by mouth daily.     No current facility-administered medications on file prior to visit.     There are no Patient Instructions on file for this visit. No Follow-up on file.   Ronelle Smallman A Lindzie Boxx, PA-C

## 2017-04-11 ENCOUNTER — Encounter (INDEPENDENT_AMBULATORY_CARE_PROVIDER_SITE_OTHER): Payer: Self-pay | Admitting: Vascular Surgery

## 2017-04-11 ENCOUNTER — Ambulatory Visit (INDEPENDENT_AMBULATORY_CARE_PROVIDER_SITE_OTHER): Payer: Medicare Other

## 2017-04-11 ENCOUNTER — Ambulatory Visit (INDEPENDENT_AMBULATORY_CARE_PROVIDER_SITE_OTHER): Payer: Medicare Other | Admitting: Vascular Surgery

## 2017-04-11 VITALS — BP 205/88 | HR 79 | Resp 16 | Ht 65.0 in | Wt 150.0 lb

## 2017-04-11 DIAGNOSIS — I739 Peripheral vascular disease, unspecified: Secondary | ICD-10-CM

## 2017-04-11 DIAGNOSIS — I6523 Occlusion and stenosis of bilateral carotid arteries: Secondary | ICD-10-CM

## 2017-04-11 DIAGNOSIS — Z79899 Other long term (current) drug therapy: Secondary | ICD-10-CM | POA: Diagnosis not present

## 2017-04-11 DIAGNOSIS — E538 Deficiency of other specified B group vitamins: Secondary | ICD-10-CM | POA: Diagnosis not present

## 2017-04-11 DIAGNOSIS — E782 Mixed hyperlipidemia: Secondary | ICD-10-CM | POA: Diagnosis not present

## 2017-04-11 DIAGNOSIS — I10 Essential (primary) hypertension: Secondary | ICD-10-CM | POA: Diagnosis not present

## 2017-04-11 DIAGNOSIS — E785 Hyperlipidemia, unspecified: Secondary | ICD-10-CM

## 2017-04-11 DIAGNOSIS — R7309 Other abnormal glucose: Secondary | ICD-10-CM | POA: Diagnosis not present

## 2017-04-11 NOTE — Progress Notes (Signed)
Subjective:    Patient ID: Ashley Hill, female    DOB: 1934/08/01, 82 y.o.   MRN: [id number] Chief Complaint  Patient presents with  . Carotid    Ultrasound follow up   Patient presents to review vascular studies.  The patient was initially referred to [place] by Dr. Judithann Sheen after experiencing a "seizure" in [place].  The patient is now on Keppra.  The patient denies any recurrent seizure-like symptoms.  The patient denies any amaurosis fugax or neurological deficits.  The patient underwent a bilateral carotid artery duplex exam which was notable for 1-39% stenosis of the bilateral internal carotid artery without hemodynamic significance.  Bidirectional flow noted in the left vertebral artery.  When compared to the previous exam on June 15, 2016 there is no significant change in the gradient stenosis of the bilateral carotid arteries.  Patient denies any fever, nausea vomiting.   Review of Systems  Constitutional: Negative.   HENT: Negative.   Eyes: Negative.   Respiratory: Negative.   Cardiovascular: Negative.   Gastrointestinal: Negative.   Endocrine: Negative.   Genitourinary: Negative.   Musculoskeletal: Negative.   Skin: Negative.   Allergic/Immunologic: Negative.   Neurological: Positive for seizures.  Hematological: Negative.   Psychiatric/Behavioral: Negative.       Objective:   Physical Exam  Constitutional: She appears well-developed and well-nourished. No distress.  HENT:  Head: Normocephalic and atraumatic.  Eyes: Conjunctivae are normal. Pupils are equal, round, and reactive to light.  Neck: Normal range of motion.  No carotid bruits noted on exam  Cardiovascular: Normal rate, regular rhythm, normal heart sounds and intact distal pulses.  Pulses:      Radial pulses are 2+ on the right side, and 2+ on the left side.  Hard to palpate pedal pulses  Pulmonary/Chest: Effort normal.  Musculoskeletal: Normal range of motion. She exhibits no edema.  Neurological: She is  alert.  Skin: Skin is warm and dry. She is not diaphoretic.  Psychiatric: She has a normal mood and affect. Her behavior is normal. Judgment and thought content normal.  Vitals reviewed.  BP (!) 205/88 (BP Location: Right Arm, Patient Position: Sitting)   Pulse 79   Resp 16   Ht 5\' 5"  (1.651 m)   Wt 150 lb (68 kg)   BMI 24.96 kg/m   Past Medical History:  Diagnosis Date  . Dyslipidemia   . Ectopic atrial tachycardia (HCC)   . Environmental allergies   . H/O scarlet fever   . Heart murmur   . HOH (hard of hearing)   . Hyperlipidemia   . Hypertension   . Peripheral vascular disease (HCC)   . 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 Harvie Bridge, [place].  . Transient cerebral ischemia    Social History   Socioeconomic History  . Marital status: Divorced    Spouse name: Not on file  . Number of children: Not on file  . Years of education: Not on file  . Highest education level: Not on file  Social Needs  . Financial resource strain: Not on file  . Food insecurity - worry: Not on file  . Food insecurity - inability: Not on file  . Transportation needs - medical: Not on file  . Transportation needs - non-medical: Not on file  Occupational History  . Not on file  Tobacco Use  . Smoking status: Former Smoker    Packs/day: 1.00  Years: 10.00    Pack years: 10.00    Types: Cigarettes    Last attempt to quit: 02/23/1981    Years since quitting: 36.1  . Smokeless tobacco: Never Used  Substance and Sexual Activity  . Alcohol use: No    Comment: rare occ  . Drug use: No  . Sexual activity: No  Other Topics Concern  . Not on file  Social History Narrative   Does not have a living will.    Wants daughter to be HPOA.   Does want CPR but no prolonged life support.   Past Surgical History:  Procedure Laterality Date  . ABLATION OF DYSRHYTHMIC FOCUS    . AUGMENTATION MAMMAPLASTY Bilateral   .  ENDARTERECTOMY FEMORAL Right 08/07/2016   Procedure: ENDARTERECTOMY FEMORAL;  Surgeon: Annice NeedyJason S Dew, MD;  Location: [facility];  Service: Vascular;  Laterality: Right;  . JOINT REPLACEMENT     knee  . KNEE ARTHROSCOPY W/ MENISCAL REPAIR     Right   . PERIPHERAL VASCULAR BALLOON ANGIOPLASTY N/A 07/26/2016   Procedure: Peripheral Vascular Balloon Angioplasty;  Surgeon: Annice NeedyJason S Dew, MD;  Location: [facility];  Service: Cardiovascular;  Laterality: N/A;  . PERIPHERAL VASCULAR CATHETERIZATION Right 01/11/2015   Procedure: Lower Extremity Angiography;  Surgeon: Annice NeedyJason S Dew, MD;  Location: [facility];  Service: Cardiovascular;  Laterality: Right;  . PERIPHERAL VASCULAR CATHETERIZATION Right 06/21/2015   Procedure: Lower Extremity Angiography;  Surgeon: Annice NeedyJason S Dew, MD;  Location: [facility];  Service: Cardiovascular;  Laterality: Right;  . PERIPHERAL VASCULAR CATHETERIZATION Right 06/21/2015   Procedure: Lower Extremity Intervention;  Surgeon: Annice NeedyJason S Dew, MD;  Location: [facility];  Service: Cardiovascular;  Laterality: Right;  . PERIPHERAL VASCULAR CATHETERIZATION Right 10/12/2015   Procedure: Lower Extremity Angiography;  Surgeon: Annice NeedyJason S Dew, MD;  Location: [facility];  Service: Cardiovascular;  Laterality: Right;  . PERIPHERAL VASCULAR CATHETERIZATION Right 10/12/2015   Procedure: Lower Extremity Intervention;  Surgeon: Annice NeedyJason S Dew, MD;  Location: [facility];  Service: Cardiovascular;  Laterality: Right;  . PERIPHERAL VASCULAR CATHETERIZATION Left 02/10/2016   Procedure: Lower Extremity Angiography;  Surgeon: Annice NeedyJason S Dew, MD;  Location: [facility];  Service: Cardiovascular;  Laterality: Left;  . PERIPHERAL VASCULAR CATHETERIZATION  02/10/2016   Procedure: Lower Extremity Intervention;  Surgeon: Annice NeedyJason S Dew, MD;  Location: [facility];  Service: Cardiovascular;;  . PERIPHERAL VASCULAR CATHETERIZATION Right 02/24/2016   Procedure:  Lower Extremity Angiography;  Surgeon: Annice NeedyJason S Dew, MD;  Location: [facility];  Service: Cardiovascular;  Laterality: Right;  . SHOULDER CLOSED REDUCTION Right 04/10/2016   Procedure: CLOSED REDUCTION SHOULDER;  Surgeon: Kennedy BuckerMichael Menz, MD;  Location: [facility];  Service: Orthopedics;  Laterality: Right;  . TONSILLECTOMY    . TOTAL KNEE ARTHROPLASTY  2015  . VAGINAL HYSTERECTOMY     Family History  Problem Relation Age of Onset  . Alcohol abuse Father   . Cancer Father   . Diabetes Paternal Grandfather   . Breast cancer Neg Hx    Allergies  Allergen Reactions  . Penicillins Anaphylaxis    Has patient had a PCN reaction causing immediate rash, facial/tongue/throat swelling, SOB or lightheadedness with hypotension: Yes Has patient had a PCN reaction causing severe rash involving mucus membranes or skin necrosis: No Has patient had a PCN reaction that required hospitalization No Has patient had a PCN reaction occurring within the last 10 years: No  If all of the above answers are "NO", then may proceed with Cephalosporin use.   . Statins     Cramps in legs  . Lipitor [Atorvastatin]       Assessment & Plan:  Patient presents to review vascular studies.  The patient was initially referred to us by Dr. Judithann SheenSparks after experiencing a "seizure" in [place].  The patient is now on Keppra.  The patient denies any recurrent seizure-like symptoms.  The patient denies any amaurosis fugax or neurological deficits.  The patient underwent a bilateral carotid artery duplex exam which was notable for 1-39% stenosis of the bilateral internal carotid artery without hemodynamic significance.  Bidirectional flow noted in the left vertebral artery.  When compared to the previous exam on June 15, 2016 there is no significant change in the gradient stenosis of the bilateral carotid arteries.  Patient denies any fever, nausea vomiting.  1. Bilateral carotid artery stenosis - Stable Patient with 1 episode  of a "seizure" while she was in [place]. The patient has not had any recurrent seizure-like symptoms. Minimal bilateral carotid artery stenosis noted on duplex When compared to the previous duplex there is been no change I do not feel the patient's recent seizure activity is from ischemia Patient to follow-up in 1 year with a repeat carotid duplex.  If this continues to be stable we can move the follow-up out every 2-3 years.  - VAS US CAROTID; Future  2. Hyperlipidemia, unspecified hyperlipidemia type - Stable Encouraged good control as its slows the progression of atherosclerotic disease  3. PAD (peripheral artery disease) (HCC) - Stable Patient with a known history of peripheral artery disease requiring endovascular and surgical intervention The patient has an appointment on December 15 with Dr. Wyn Quakerew to undergo arterial studies. The patient is currently asymptomatic.  Current Outpatient Medications on File Prior to Visit  Medication Sig Dispense Refill  . amLODipine (NORVASC) 5 MG tablet Take 5 mg by mouth daily.    Marland Kitchen. apixaban (ELIQUIS) 5 MG TABS tablet Take 1 tablet (5 mg total) by mouth 2 (two) times daily. 60 tablet 2  . aspirin 81 MG tablet Take 81 mg by mouth daily.    Marland Kitchen. b complex vitamins tablet Take 1 tablet by mouth daily.    Marland Kitchen. CALCIUM PO Take 750 mg by mouth 2 (two) times daily.    . carvedilol (COREG) 25 MG tablet Take 25 mg by mouth 2 (two) times daily with a meal.    . cholecalciferol (VITAMIN D) 1000 units tablet Take 2,000 Units by mouth daily.    . Coenzyme Q10 (COQ10) 50 MG CAPS Take 1 capsule by mouth daily.    . CRESTOR 5 MG tablet TAKE 1 TABLET BY MOUTH EVERY DAY 30 tablet 6  . hydrochlorothiazide (HYDRODIURIL) 25 MG tablet TAKE 1 TABLET BY MOUTH EVERY DAY 30 tablet 3  . levETIRAcetam (KEPPRA) 500 MG tablet Take 500 mg 2 (two) times daily by mouth.    . traMADol (ULTRAM) 50 MG tablet Take 1 tablet (50 mg total) by mouth every 6 (six) hours as needed. 30 tablet 0  .  Turmeric 450 MG CAPS Take 450 mg by mouth daily.     No current facility-administered medications on file prior to visit.     There are no Patient Instructions on file for this visit. No Follow-up on file.   Keianna Signer A Giordano Getman, PA-C

## 2017-05-01 ENCOUNTER — Encounter (INDEPENDENT_AMBULATORY_CARE_PROVIDER_SITE_OTHER): Payer: Self-pay | Admitting: Vascular Surgery

## 2017-05-01 ENCOUNTER — Ambulatory Visit (INDEPENDENT_AMBULATORY_CARE_PROVIDER_SITE_OTHER): Payer: Medicare Other

## 2017-05-01 ENCOUNTER — Ambulatory Visit (INDEPENDENT_AMBULATORY_CARE_PROVIDER_SITE_OTHER): Payer: Medicare Other | Admitting: Vascular Surgery

## 2017-05-01 VITALS — BP 180/92 | HR 94 | Resp 16 | Ht 65.5 in | Wt 152.2 lb

## 2017-05-01 DIAGNOSIS — I10 Essential (primary) hypertension: Secondary | ICD-10-CM

## 2017-05-01 DIAGNOSIS — E785 Hyperlipidemia, unspecified: Secondary | ICD-10-CM | POA: Diagnosis not present

## 2017-05-01 DIAGNOSIS — F0280 Dementia in other diseases classified elsewhere without behavioral disturbance: Secondary | ICD-10-CM | POA: Diagnosis not present

## 2017-05-01 DIAGNOSIS — I70229 Atherosclerosis of native arteries of extremities with rest pain, unspecified extremity: Secondary | ICD-10-CM

## 2017-05-01 DIAGNOSIS — I739 Peripheral vascular disease, unspecified: Secondary | ICD-10-CM

## 2017-05-01 DIAGNOSIS — G309 Alzheimer's disease, unspecified: Secondary | ICD-10-CM | POA: Diagnosis not present

## 2017-05-01 DIAGNOSIS — I6523 Occlusion and stenosis of bilateral carotid arteries: Secondary | ICD-10-CM

## 2017-05-01 NOTE — Assessment & Plan Note (Signed)
Her symptoms seem to have stabilized to slightly improved.  She still has some pain and I think a prescription for tramadol is reasonable.  Her disease is recurrent and severe and I do not think surgery at this time would likely provide her much benefit.  She seems to have gotten some better with conservative therapy.  Continue current medical regimen.  Recheck in 6 months

## 2017-05-01 NOTE — Progress Notes (Signed)
MRN : [id number]  Ashley Hill is a 82 y.o. ([birth year]) female who presents with chief complaint of  Chief Complaint  Patient presents with  . Follow-up    4-2790mo ABI  .  History of Present Illness: Patient returns today in follow up of PAD.  She continues to have some numbness in her foot as well as pain.  This seems to have gotten a little bit better since her last visit several months ago.  No new ulcerations or infection.  Her ABIs today are slightly improved on the right now measuring 0.44 and stable on the left measuring 0.90.  Current Outpatient Prescriptions  Medication Sig Dispense Refill  . amLODipine (NORVASC) 5 MG tablet Take 5 mg by mouth daily.    Marland Kitchen. apixaban (ELIQUIS) 5 MG TABS tablet Take 1 tablet (5 mg total) by mouth 2 (two) times daily. 60 tablet 2  . aspirin 81 MG tablet Take 81 mg by mouth daily.    Marland Kitchen. b complex vitamins tablet Take 1 tablet by mouth daily.    Marland Kitchen. CALCIUM PO Take 750 mg by mouth 2 (two) times daily.    . carvedilol (COREG) 25 MG tablet Take 25 mg by mouth 2 (two) times daily with a meal.    . cholecalciferol (VITAMIN D) 1000 units tablet Take 2,000 Units by mouth daily.    . Coenzyme Q10 (COQ10) 50 MG CAPS Take 1 capsule by mouth daily.    . CRESTOR 5 MG tablet TAKE 1 TABLET BY MOUTH EVERY DAY 30 tablet 6  . hydrochlorothiazide (HYDRODIURIL) 25 MG tablet TAKE 1 TABLET BY MOUTH EVERY DAY 30 tablet 3  . Turmeric 450 MG CAPS Take 450 mg by mouth daily.    . traMADol (ULTRAM) 50 MG tablet Take 1 tablet (50 mg total) by mouth every 6 (six) hours as needed. (Patient not taking: Reported on 11/14/2016) 30 tablet 0   No current facility-administered medications for this visit.         Past Medical History:  Diagnosis Date  . Dyslipidemia   . Ectopic atrial tachycardia (HCC)   . Environmental allergies   . H/O scarlet fever   . Heart murmur   . HOH (hard of hearing)   . Hyperlipidemia   . Hypertension   . Peripheral vascular  disease (HCC)   . 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 Harvie BridgeSagi N[place]  . Transient cerebral ischemia          Past Surgical History:  Procedure Laterality Date  . ABLATION OF DYSRHYTHMIC FOCUS    . AUGMENTATION MAMMAPLASTY Bilateral   . ENDARTERECTOMY FEMORAL Right 08/07/2016   Procedure: ENDARTERECTOMY FEMORAL;  Surgeon: Annice NeedyJason S Despina Boan, MD;  Location: [facility];  Service: Vascular;  Laterality: Right;  . JOINT REPLACEMENT     knee  . KNEE ARTHROSCOPY W/ MENISCAL REPAIR     Right   . PERIPHERAL VASCULAR BALLOON ANGIOPLASTY N/A 07/26/2016   Procedure: Peripheral Vascular Balloon Angioplasty;  Surgeon: Annice NeedyJason S Valissa Lyvers, MD;  Location: [facility];  Service: Cardiovascular;  Laterality: N/A;  . PERIPHERAL VASCULAR CATHETERIZATION Right 01/11/2015   Procedure: Lower Extremity Angiography;  Surgeon: Annice NeedyJason S Damarea Merkel, MD;  Location: [facility];  Service: Cardiovascular;  Laterality: Right;  . PERIPHERAL VASCULAR CATHETERIZATION Right 06/21/2015   Procedure: Lower Extremity Angiography;  Surgeon: Annice NeedyJason S Tobi Leinweber, MD;  Location: [facility];  Service: Cardiovascular;  Laterality: Right;  . PERIPHERAL VASCULAR CATHETERIZATION Right 06/21/2015   Procedure: Lower Extremity Intervention;  Surgeon: Annice Needy, MD;  Location: [facility];  Service: Cardiovascular;  Laterality: Right;  . PERIPHERAL VASCULAR CATHETERIZATION Right 10/12/2015   Procedure: Lower Extremity Angiography;  Surgeon: Annice Needy, MD;  Location: [facility];  Service: Cardiovascular;  Laterality: Right;  . PERIPHERAL VASCULAR CATHETERIZATION Right 10/12/2015   Procedure: Lower Extremity Intervention;  Surgeon: Annice Needy, MD;  Location: [facility];  Service: Cardiovascular;  Laterality: Right;  . PERIPHERAL VASCULAR CATHETERIZATION Left 02/10/2016   Procedure: Lower Extremity Angiography;  Surgeon: Annice Needy, MD;  Location: [facility];  Service: Cardiovascular;  Laterality: Left;  . PERIPHERAL VASCULAR CATHETERIZATION  02/10/2016   Procedure: Lower Extremity Intervention;  Surgeon: Annice Needy, MD;  Location: [facility];  Service: Cardiovascular;;  . PERIPHERAL VASCULAR CATHETERIZATION Right 02/24/2016   Procedure: Lower Extremity Angiography;  Surgeon: Annice Needy, MD;  Location: [facility];  Service: Cardiovascular;  Laterality: Right;  . SHOULDER CLOSED REDUCTION Right 04/10/2016   Procedure: CLOSED REDUCTION SHOULDER;  Surgeon: Kennedy Bucker, MD;  Location: [facility];  Service: Orthopedics;  Laterality: Right;  . TONSILLECTOMY    . TOTAL KNEE ARTHROPLASTY  2015  . VAGINAL HYSTERECTOMY      Social History       Social History  Substance Use Topics  . Smoking status: Former Smoker    Packs/day: 1.00    Years: 10.00    Types: Cigarettes    Quit date: 02/23/1981  . Smokeless tobacco: Never Used  . Alcohol use No     Comment: rare occ    Family History      Family History  Problem Relation Age of Onset  . Alcohol abuse Father   . Cancer Father   . Diabetes Paternal Grandfather   . Breast cancer Neg Hx          Allergies  Allergen Reactions  . Penicillins Anaphylaxis    Has patient had a PCN reaction causing immediate rash, facial/tongue/throat swelling, SOB or lightheadedness with hypotension: Yes Has patient had a PCN reaction causing severe rash involving mucus membranes or skin necrosis: No Has patient had a PCN reaction that required hospitalization No Has patient had a PCN reaction occurring within the last 10 years: No If all of the above answers are "NO", then may proceed with Cephalosporin use.   . Statins     Cramps in legs  . Lipitor [Atorvastatin]      REVIEW OF SYSTEMS (Negative unless checked)  Constitutional: [] Weight loss  [] Fever  [] Chills Cardiac: [] Chest pain   [] Chest  pressure   [] Palpitations   [] Shortness of breath when laying flat   [] Shortness of breath at rest   [] Shortness of breath with exertion. Vascular:  [x] Pain in legs with walking   [] Pain in legs at rest   [] Pain in legs when laying flat   [x] Claudication   [] Pain in feet when walking  [] Pain in feet at rest  [] Pain in feet when laying flat   [] History of DVT   [] Phlebitis   [] Swelling in legs   [] Varicose veins   [] Non-healing ulcers Pulmonary:   [] Uses home oxygen   [] Productive cough   [] Hemoptysis   [] Wheeze  [] COPD   [] Asthma Neurologic:  [] Dizziness  [] Blackouts   [] Seizures   [] History of stroke   [] History of TIA  [] Aphasia   []   Temporary blindness   [] Dysphagia   [] Weakness or numbness in arms   [x] Weakness or numbness in legs  X memory loss worsening Musculoskeletal:  [] Arthritis   [] Joint swelling   [] Joint pain   [] Low back pain Hematologic:  [] Easy bruising  [] Easy bleeding   [] Hypercoagulable state   [] Anemic   Gastrointestinal:  [] Blood in stool   [] Vomiting blood  [] Gastroesophageal reflux/heartburn   [] Abdominal pain Genitourinary:  [] Chronic kidney disease   [] Difficult urination  [] Frequent urination  [] Burning with urination   [] Hematuria Skin:  [] Rashes   [] Ulcers   [] Wounds Psychological:  [x] History of anxiety   []  History of major depression.     Physical Examination  BP (!) 180/92 (BP Location: Left Arm)   Pulse 94   Resp 16   Ht 5' 5.5" (1.664 m)   Wt 69 kg (152 lb 3.2 oz)   BMI 24.94 kg/m  Gen:  WD/WN, NAD Head: [place]/AT, No temporalis wasting. Ear/Nose/Throat: Hearing grossly intact, nares w/o erythema or drainage, trachea midline Eyes: Conjunctiva clear. Sclera non-icteric Neck: Supple.  No JVD.  Pulmonary:  Good air movement, no use of accessory muscles.  Cardiac: RRR, normal S1, S2 Vascular:  Vessel Right Left  Radial Palpable Palpable                          PT  trace palpable  1+ palpable  DP  trace palpable Palpable    Musculoskeletal: M/S 5/5  throughout.  No deformity or atrophy.  Mild right lower extremity edema. Neurologic: Sensation grossly intact in extremities.  Symmetrical.  Speech is fluent.  Psychiatric: Judgment intact, Mood & affect appropriate for pt's clinical situation. Dermatologic: No rashes or ulcers noted.  No cellulitis or open wounds.       Labs No results found for this or any previous visit (from the past 2160 hour(s)).  Radiology No results found.   Assessment/Plan Hypertension blood pressure control important in reducing the progression of atherosclerotic disease. On appropriate oral medications.   Dementia in Alzheimer's disease Seems to be reasonably stable today  Hyperlipidemia lipid control important in reducing the progression of atherosclerotic disease. Continue statin therapy   Bilateral carotid artery stenosis Checked earlier this year and found to be stable.  To be rechecked next year  Atherosclerosis of artery of extremity with rest pain (HCC) Her symptoms seem to have stabilized to slightly improved.  She still has some pain and I think a prescription for tramadol is reasonable.  Her disease is recurrent and severe and I do not think surgery at this time would likely provide her much benefit.  She seems to have gotten some better with conservative therapy.  Continue current medical regimen.  Recheck in 6 months    Festus BarrenJason Dwanna Goshert, MD  05/01/2017 12:45 PM    This note was created with Dragon medical transcription system.  Any errors from dictation are purely unintentional

## 2017-05-01 NOTE — Patient Instructions (Signed)

## 2017-05-01 NOTE — Assessment & Plan Note (Signed)
Checked earlier this year and found to be stable.  To be rechecked next year

## 2017-05-22 DIAGNOSIS — G40909 Epilepsy, unspecified, not intractable, without status epilepticus: Secondary | ICD-10-CM | POA: Insufficient documentation

## 2017-08-18 NOTE — Pre-Procedure Instructions (Signed)
Requested completed heart and lung assessment on H&P.

## 2017-08-22 ENCOUNTER — Encounter: Payer: Self-pay | Admitting: *Deleted

## 2017-08-28 ENCOUNTER — Ambulatory Visit: Payer: Medicare Other | Admitting: Anesthesiology

## 2017-08-28 ENCOUNTER — Other Ambulatory Visit: Payer: Self-pay

## 2017-08-28 ENCOUNTER — Encounter: Payer: Self-pay | Admitting: *Deleted

## 2017-08-28 ENCOUNTER — Ambulatory Visit
Admission: RE | Admit: 2017-08-28 | Discharge: 2017-08-28 | Disposition: A | Discharge status: Home / Self Care | Payer: Medicare Other | Source: Ambulatory Visit | Attending: Ophthalmology | Admitting: Ophthalmology

## 2017-08-28 ENCOUNTER — Encounter
Admission: RE | Disposition: A | Discharge status: Home / Self Care | Payer: Self-pay | Source: Ambulatory Visit | Attending: Ophthalmology

## 2017-08-28 DIAGNOSIS — M1990 Unspecified osteoarthritis, unspecified site: Secondary | ICD-10-CM | POA: Diagnosis not present

## 2017-08-28 DIAGNOSIS — I739 Peripheral vascular disease, unspecified: Secondary | ICD-10-CM | POA: Diagnosis not present

## 2017-08-28 DIAGNOSIS — Z87891 Personal history of nicotine dependence: Secondary | ICD-10-CM | POA: Diagnosis not present

## 2017-08-28 DIAGNOSIS — Z7982 Long term (current) use of aspirin: Secondary | ICD-10-CM | POA: Diagnosis not present

## 2017-08-28 DIAGNOSIS — H2512 Age-related nuclear cataract, left eye: Secondary | ICD-10-CM | POA: Insufficient documentation

## 2017-08-28 DIAGNOSIS — Z880 Allergy status to penicillin: Secondary | ICD-10-CM | POA: Insufficient documentation

## 2017-08-28 DIAGNOSIS — E7800 Pure hypercholesterolemia, unspecified: Secondary | ICD-10-CM | POA: Insufficient documentation

## 2017-08-28 DIAGNOSIS — Z888 Allergy status to other drugs, medicaments and biological substances status: Secondary | ICD-10-CM | POA: Insufficient documentation

## 2017-08-28 DIAGNOSIS — F0390 Unspecified dementia without behavioral disturbance: Secondary | ICD-10-CM | POA: Insufficient documentation

## 2017-08-28 DIAGNOSIS — Z79899 Other long term (current) drug therapy: Secondary | ICD-10-CM | POA: Insufficient documentation

## 2017-08-28 DIAGNOSIS — I471 Supraventricular tachycardia: Secondary | ICD-10-CM | POA: Diagnosis not present

## 2017-08-28 DIAGNOSIS — R011 Cardiac murmur, unspecified: Secondary | ICD-10-CM | POA: Diagnosis not present

## 2017-08-28 DIAGNOSIS — I10 Essential (primary) hypertension: Secondary | ICD-10-CM | POA: Diagnosis not present

## 2017-08-28 HISTORY — PX: CATARACT EXTRACTION W/PHACO: SHX586

## 2017-08-28 HISTORY — DX: Cardiac arrhythmia, unspecified: I49.9

## 2017-08-28 SURGERY — PHACOEMULSIFICATION, CATARACT, WITH IOL INSERTION
Anesthesia: Monitor Anesthesia Care | Site: Eye | Laterality: Left | Wound class: Clean

## 2017-08-28 MED ORDER — MIDAZOLAM HCL 2 MG/2ML IJ SOLN
INTRAMUSCULAR | Status: DC | PRN
  Administered 2017-08-28: 1 mg via INTRAVENOUS

## 2017-08-28 MED ORDER — BSS IO SOLN
INTRAOCULAR | Status: DC | PRN
  Administered 2017-08-28: 1 mL via OPHTHALMIC

## 2017-08-28 MED ORDER — ARMC OPHTHALMIC DILATING DROPS
1.0000 "application " | OPHTHALMIC | Status: AC
  Administered 2017-08-28 (×3): 1 via OPHTHALMIC

## 2017-08-28 MED ORDER — NA CHONDROIT SULF-NA HYALURON 40-17 MG/ML IO SOLN
INTRAOCULAR | Status: DC | PRN
  Administered 2017-08-28: 1 mL via INTRAOCULAR

## 2017-08-28 MED ORDER — SODIUM CHLORIDE 0.9 % IV SOLN
INTRAVENOUS | Status: DC
  Administered 2017-08-28: 10:00:00 via INTRAVENOUS

## 2017-08-28 MED ORDER — MOXIFLOXACIN HCL 0.5 % OP SOLN
OPHTHALMIC | Status: DC | PRN
  Administered 2017-08-28: .2 mL via OPHTHALMIC

## 2017-08-28 MED ORDER — MIDAZOLAM HCL 2 MG/2ML IJ SOLN
INTRAMUSCULAR | Status: AC
  Filled 2017-08-28: qty 2

## 2017-08-28 MED ORDER — MOXIFLOXACIN HCL 0.5 % OP SOLN: 1.0000 [drp] | OPHTHALMIC | Status: DC | PRN

## 2017-08-28 MED ORDER — POVIDONE-IODINE 5 % OP SOLN
OPHTHALMIC | Status: DC | PRN
  Administered 2017-08-28: 1 via OPHTHALMIC

## 2017-08-28 MED ORDER — CARBACHOL 0.01 % IO SOLN
INTRAOCULAR | Status: DC | PRN
  Administered 2017-08-28: .5 mL via INTRAOCULAR

## 2017-08-28 MED ORDER — LIDOCAINE HCL (PF) 4 % IJ SOLN
INTRAOCULAR | Status: DC | PRN
  Administered 2017-08-28: 2 mL via OPHTHALMIC

## 2017-08-28 SURGICAL SUPPLY — 16 items
GLOVE BIO SURGEON STRL SZ8 (GLOVE) ×2 IMPLANT
GLOVE BIOGEL M 6.5 STRL (GLOVE) ×2 IMPLANT
GLOVE SURG LX 8.0 MICRO (GLOVE) ×1
GLOVE SURG LX STRL 8.0 MICRO (GLOVE) ×1 IMPLANT
GOWN STRL REUS W/ TWL LRG LVL3 (GOWN DISPOSABLE) ×2 IMPLANT
GOWN STRL REUS W/TWL LRG LVL3 (GOWN DISPOSABLE) ×2
LABEL CATARACT MEDS ST (LABEL) ×2 IMPLANT
LENS IOL TECNIS ITEC 21.5 (Intraocular Lens) ×2 IMPLANT
PACK CATARACT (MISCELLANEOUS) ×2 IMPLANT
PACK CATARACT BRASINGTON LX (MISCELLANEOUS) ×2 IMPLANT
PACK EYE AFTER SURG (MISCELLANEOUS) ×2 IMPLANT
SOL BSS BAG (MISCELLANEOUS) ×2
SOLUTION BSS BAG (MISCELLANEOUS) ×1 IMPLANT
SYR 5ML LL (SYRINGE) ×2 IMPLANT
WATER STERILE IRR 250ML POUR (IV SOLUTION) ×2 IMPLANT
WIPE NON LINTING 3.25X3.25 (MISCELLANEOUS) ×2 IMPLANT

## 2017-08-28 NOTE — Anesthesia Post-op Follow-up Note (Signed)
Anesthesia QCDR form completed.        

## 2017-08-28 NOTE — Discharge Instructions (Signed)
Eye Surgery Discharge Instructions  Expect mild scratchy sensation or mild soreness. DO NOT RUB YOUR EYE!  The day of surgery:  Minimal physical activity, but bed rest is not required  No reading, computer work, or close hand work  No bending, lifting, or straining.  May watch TV  For 24 hours:  No driving, legal decisions, or alcoholic beverages  Safety precautions  Eat anything you prefer: It is better to start with liquids, then soup then solid foods.  _____ Eye patch should be worn until postoperative exam tomorrow.  ____ Solar shield eyeglasses should be worn for comfort in the sunlight/patch while sleeping  Resume all regular medications including aspirin or Coumadin if these were discontinued prior to surgery. You may shower, bathe, shave, or wash your hair. Tylenol may be taken for mild discomfort.  Call your doctor if you experience significant pain, nausea, or vomiting, fever > 101 or other signs of infection. [phone] or [phone] Specific instructions:  Follow-up Information    Galen ManilaPorfilio, William, MD Follow up.   Specialty:  Ophthalmology Why:  April 17 at 1:45pm Contact information: [address] [place] [address] [phone]

## 2017-08-28 NOTE — H&P (Signed)
All labs reviewed. Abnormal studies sent to patients PCP when indicated.  Previous H&P reviewed, patient examined, there are NO CHANGES.  Ashley Teas Porfilio4/16/201911:17 AM

## 2017-08-28 NOTE — Anesthesia Preprocedure Evaluation (Signed)
Anesthesia Evaluation  Patient identified by MRN, date of birth, ID band Patient awake    Reviewed: Allergy & Precautions, NPO status , Patient's Chart, lab work & pertinent test results  History of Anesthesia Complications Negative for: history of anesthetic complications  Airway Mallampati: II  TM Distance: >3 FB Neck ROM: Full    Dental no notable dental hx.    Pulmonary neg sleep apnea, neg COPD, former smoker,    breath sounds clear to auscultation- rhonchi (-) wheezing      Cardiovascular hypertension, Pt. on medications + Peripheral Vascular Disease  (-) CAD, (-) Past MI, (-) Cardiac Stents and (-) CABG + dysrhythmias Supra Ventricular Tachycardia  Rhythm:Regular Rate:Normal - Systolic murmurs and - Diastolic murmurs    Neuro/Psych Dementia negative neurological ROS     GI/Hepatic negative GI ROS, Neg liver ROS,   Endo/Other  negative endocrine ROSneg diabetes  Renal/GU negative Renal ROS     Musculoskeletal  (+) Arthritis ,   Abdominal (+) - obese,   Peds  Hematology negative hematology ROS (+)   Anesthesia Other Findings Past Medical History: No date: Dyslipidemia No date: Dysrhythmia No date: Ectopic atrial tachycardia (HCC) No date: Environmental allergies No date: H/O scarlet fever No date: Heart murmur     Comment:  IN PAST No date: HOH (hard of hearing) No date: Hyperlipidemia No date: Hypertension No date: Peripheral vascular disease (HCC) No date: 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 Arnold Long, [place]. No date: Transient cerebral ischemia   Reproductive/Obstetrics                             Anesthesia Physical Anesthesia Plan  ASA: III  Anesthesia Plan: MAC   Post-op Pain Management:    Induction: Intravenous  PONV Risk Score and Plan: 2 and  Midazolam  Airway Management Planned: Natural Airway  Additional Equipment:   Intra-op Plan:   Post-operative Plan:   Informed Consent: I have reviewed the patients History and Physical, chart, labs and discussed the procedure including the risks, benefits and alternatives for the proposed anesthesia with the patient or authorized representative who has indicated his/her understanding and acceptance.     Plan Discussed with: CRNA and Anesthesiologist  Anesthesia Plan Comments: (Pt ate a grape this morning at 0500, will hold off until 1100)        Anesthesia Quick Evaluation

## 2017-08-28 NOTE — Progress Notes (Signed)
Case to be delayed due to patient eating a grape at 5AM.  Patient walking around with daughter, understands that she is not to have anything to eat or drink.

## 2017-08-28 NOTE — Transfer of Care (Signed)
Immediate Anesthesia Transfer of Care Note  Patient: Ashley Hill  Procedure(s) Performed: CATARACT EXTRACTION PHACO AND INTRAOCULAR LENS PLACEMENT (IOC) (Left Eye)  Patient Location: Short Stay  Anesthesia Type:MAC  Level of Consciousness: awake, alert  and oriented  Airway & Oxygen Therapy: Patient Spontanous Breathing  Post-op Assessment: Post -op Vital signs reviewed and stable  Post vital signs: stable  Last Vitals:  Vitals Value Taken Time  BP    Temp    Pulse    Resp    SpO2      Last Pain:  Vitals:   08/28/17 0710  TempSrc: Oral         Complications: No apparent anesthesia complications

## 2017-08-28 NOTE — Anesthesia Postprocedure Evaluation (Signed)
Anesthesia Post Note  Patient: Ashley Hill  Procedure(s) Performed: CATARACT EXTRACTION PHACO AND INTRAOCULAR LENS PLACEMENT (IOC) (Left Eye)  Patient location during evaluation: PACU Anesthesia Type: MAC Level of consciousness: awake and alert Pain management: pain level controlled Vital Signs Assessment: post-procedure vital signs reviewed and stable Respiratory status: spontaneous breathing, nonlabored ventilation, respiratory function stable and patient connected to nasal cannula oxygen Cardiovascular status: stable and blood pressure returned to baseline Postop Assessment: no apparent nausea or vomiting Anesthetic complications: no     Last Vitals:  Vitals:   08/28/17 0710  BP: (!) 178/99  Pulse: 75  Resp: 16  Temp: (!) 36.1 C  SpO2: 96%    Last Pain:  Vitals:   08/28/17 0710  TempSrc: Oral                 Estill Batten

## 2017-08-28 NOTE — Op Note (Signed)
PREOPERATIVE DIAGNOSIS:  Nuclear sclerotic cataract of the left eye.   POSTOPERATIVE DIAGNOSIS:  Nuclear sclerotic cataract of the left eye.   OPERATIVE PROCEDURE: Procedure(s): CATARACT EXTRACTION PHACO AND INTRAOCULAR LENS PLACEMENT (IOC)   SURGEON:  Galen ManilaWilliam Kailoni Vahle, MD.   ANESTHESIA:  Anesthesiologist: Alver FisherPenwarden, Amy, MD CRNA: Irving BurtonBachich, Jennifer, CRNA  1.      Managed anesthesia care. 2.     0.381ml of Shugarcaine was instilled following the paracentesis   COMPLICATIONS:  None.   TECHNIQUE:   Stop and chop   DESCRIPTION OF PROCEDURE:  The patient was examined and consented in the preoperative holding area where the aforementioned topical anesthesia was applied to the left eye and then brought back to the Operating Room where the left eye was prepped and draped in the usual sterile ophthalmic fashion and a lid speculum was placed. A paracentesis was created with the side port blade and the anterior chamber was filled with viscoelastic. A near clear corneal incision was performed with the steel keratome. A continuous curvilinear capsulorrhexis was performed with a cystotome followed by the capsulorrhexis forceps. Hydrodissection and hydrodelineation were carried out with BSS on a blunt cannula. The lens was removed in a stop and chop  technique and the remaining cortical material was removed with the irrigation-aspiration handpiece. The capsular bag was inflated with viscoelastic and the Technis [id number] lens was placed in the capsular bag without complication. The remaining viscoelastic was removed from the eye with the irrigation-aspiration handpiece. The wounds were hydrated. The anterior chamber was flushed with Miostat and the eye was inflated to physiologic pressure. 0.721ml Vigamox was placed in the anterior chamber. The wounds were found to be water tight. The eye was dressed with Vigamox. The patient was given protective glasses to wear throughout the day and a shield with which to sleep  tonight. The patient was also given drops with which to begin a drop regimen today and will follow-up with me in one day. Implant Name Type Inv. Item Serial No. Manufacturer Lot No. LRB No. Used  LENS IOL DIOP 21.5 - [id number] Intraocular Lens LENS IOL DIOP 21.5 [id number]  Left 1    Procedure(s) with comments: CATARACT EXTRACTION PHACO AND INTRAOCULAR LENS PLACEMENT (IOC) (Left) - US 00:26 AP% 18.4 CDE 4.85 Fluid pa ck lot # [id number]  Electronically signed: Galen ManilaWilliam Lorri Fukuhara 08/28/2017 11:39 AM

## 2017-09-14 ENCOUNTER — Other Ambulatory Visit: Payer: Self-pay | Admitting: Internal Medicine

## 2017-09-14 DIAGNOSIS — Z1231 Encounter for screening mammogram for malignant neoplasm of breast: Secondary | ICD-10-CM

## 2017-11-06 ENCOUNTER — Ambulatory Visit (INDEPENDENT_AMBULATORY_CARE_PROVIDER_SITE_OTHER): Payer: Medicare Other | Admitting: Vascular Surgery

## 2017-11-06 ENCOUNTER — Encounter (INDEPENDENT_AMBULATORY_CARE_PROVIDER_SITE_OTHER): Payer: Medicare Other

## 2018-01-11 ENCOUNTER — Ambulatory Visit (INDEPENDENT_AMBULATORY_CARE_PROVIDER_SITE_OTHER): Payer: Medicare Other | Admitting: Vascular Surgery

## 2018-01-11 ENCOUNTER — Other Ambulatory Visit (INDEPENDENT_AMBULATORY_CARE_PROVIDER_SITE_OTHER): Payer: Self-pay

## 2018-01-11 ENCOUNTER — Encounter (INDEPENDENT_AMBULATORY_CARE_PROVIDER_SITE_OTHER): Payer: Self-pay | Admitting: Vascular Surgery

## 2018-01-11 ENCOUNTER — Ambulatory Visit (INDEPENDENT_AMBULATORY_CARE_PROVIDER_SITE_OTHER): Payer: Medicare Other

## 2018-01-11 ENCOUNTER — Encounter (INDEPENDENT_AMBULATORY_CARE_PROVIDER_SITE_OTHER): Payer: Self-pay

## 2018-01-11 VITALS — BP 175/90 | HR 81 | Wt 153.4 lb

## 2018-01-11 DIAGNOSIS — G309 Alzheimer's disease, unspecified: Secondary | ICD-10-CM | POA: Diagnosis not present

## 2018-01-11 DIAGNOSIS — E785 Hyperlipidemia, unspecified: Secondary | ICD-10-CM

## 2018-01-11 DIAGNOSIS — I70229 Atherosclerosis of native arteries of extremities with rest pain, unspecified extremity: Secondary | ICD-10-CM | POA: Diagnosis not present

## 2018-01-11 DIAGNOSIS — I10 Essential (primary) hypertension: Secondary | ICD-10-CM

## 2018-01-11 DIAGNOSIS — I70211 Atherosclerosis of native arteries of extremities with intermittent claudication, right leg: Secondary | ICD-10-CM | POA: Diagnosis not present

## 2018-01-11 DIAGNOSIS — F0280 Dementia in other diseases classified elsewhere without behavioral disturbance: Secondary | ICD-10-CM

## 2018-01-11 MED ORDER — TRAMADOL HCL 50 MG PO TABS: 50.0000 mg | ORAL_TABLET | Freq: Four times a day (QID) | ORAL | 0 refills | Status: DC | PRN

## 2018-01-11 NOTE — Patient Instructions (Signed)

## 2018-01-11 NOTE — Assessment & Plan Note (Signed)
Her ABIs today are slightly improved on the right at 0.53 and stable on the left at 0.89. She has undergone a number of procedures and her perfusion is currently stable.  I do not feel like further procedures are likely going to be of much benefit and as long as her symptoms are tolerable, I believe she should continue Eliquis, 81 mg of aspirin, and Crestor daily.  She is on a low dose of Crestor if she had intolerance to higher doses.  We will recheck her perfusion in 437-month intervals where she will contact our office sooner with problems in the interim.

## 2018-01-11 NOTE — Progress Notes (Signed)
MRN : [id number]  Ashley Hill is a 83 y.o. (05/10/1935) female who presents with chief complaint of  Chief Complaint  Patient presents with  . Follow-up    58month abi  .  History of Present Illness: Patient returns today in follow up of her PAD.  She is in good spirits today.  Her daughter accompanies her.  She still has some numbness in her right foot and stable claudication symptoms in the right leg without ulceration or ischemic rest pain.  No major changes since her last visit.  Her ABIs today are slightly improved on the right at 0.53 and stable on the left at 0.89.  Current Outpatient Medications  Medication Sig Dispense Refill  . Alpha-Lipoic Acid 600 MG CAPS Take 600 mg by mouth daily.    Marland Kitchen. amLODipine (NORVASC) 5 MG tablet Take 5 mg by mouth daily.    Marland Kitchen. apixaban (ELIQUIS) 5 MG TABS tablet Take 1 tablet (5 mg total) by mouth 2 (two) times daily. 60 tablet 2  . b complex vitamins tablet Take 1 tablet by mouth daily.    . Calcium Carbonate-Vitamin D (CALCIUM-VITAMIN D3 PO) Take 1 tablet by mouth 2 (two) times daily.    . carvedilol (COREG) 25 MG tablet Take 25 mg by mouth 2 (two) times daily with a meal.    . Cholecalciferol (VITAMIN D) 2000 units CAPS Take 2,000 Units by mouth daily.    . Coenzyme Q10 (COQ10) 50 MG CAPS Take 50 mg by mouth daily.     . CRESTOR 5 MG tablet TAKE 1 TABLET BY MOUTH EVERY DAY (Patient taking differently: TAKE 5 MG BY MOUTH EVERY DAY) 30 tablet 6  . donepezil (ARICEPT) 10 MG tablet Take 10 mg by mouth at bedtime.    . levETIRAcetam (KEPPRA) 250 MG tablet Take 250 mg by mouth 2 (two) times daily.     . Turmeric 450 MG CAPS Take 450 mg by mouth daily.    Marland Kitchen. aspirin 81 MG tablet Take 81 mg by mouth daily.    . DULoxetine (CYMBALTA) 30 MG capsule Take 30 mg by mouth daily.    . hydrochlorothiazide (HYDRODIURIL) 25 MG tablet TAKE 1 TABLET BY MOUTH EVERY DAY (Patient not taking: Reported on 08/21/2017) 30 tablet 3  . pregabalin (LYRICA) 100 MG capsule Take 100  mg by mouth at bedtime.    . traMADol (ULTRAM) 50 MG tablet Take 1 tablet (50 mg total) by mouth every 6 (six) hours as needed. (Patient not taking: Reported on 05/01/2017) 30 tablet 0   No current facility-administered medications for this visit.     Past Medical History:  Diagnosis Date  . Dyslipidemia   . Dysrhythmia   . Ectopic atrial tachycardia (HCC)   . Environmental allergies   . H/O scarlet fever   . Heart murmur    IN PAST  . HOH (hard of hearing)   . Hyperlipidemia   . Hypertension   . Peripheral vascular disease (HCC)   . 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 [place]  . Transient cerebral ischemia     Past Surgical History:  Procedure Laterality Date  . ABLATION OF DYSRHYTHMIC FOCUS    . AUGMENTATION MAMMAPLASTY Bilateral   . CATARACT EXTRACTION W/PHACO Left 08/28/2017   Procedure: CATARACT EXTRACTION PHACO AND INTRAOCULAR LENS PLACEMENT (IOC);  Surgeon: Galen ManilaPorfilio, William, MD;  Location: [facility];  Service: Ophthalmology;  Laterality: Left;  [place] 00:26 AP% 18.4 CDE 4.85 Fluid pa ck lot # [id number]  . CORONARY ANGIOPLASTY    . ENDARTERECTOMY FEMORAL Right 08/07/2016   Procedure: ENDARTERECTOMY FEMORAL;  Surgeon: Annice Needy, MD;  Location: [facility];  Service: Vascular;  Laterality: Right;  . EYE SURGERY    . JOINT REPLACEMENT     knee  . KNEE ARTHROSCOPY W/ MENISCAL REPAIR     Right   . PERIPHERAL VASCULAR BALLOON ANGIOPLASTY N/A 07/26/2016   Procedure: Peripheral Vascular Balloon Angioplasty;  Surgeon: Annice Needy, MD;  Location: [facility];  Service: Cardiovascular;  Laterality: N/A;  . PERIPHERAL VASCULAR CATHETERIZATION Right 01/11/2015   Procedure: Lower Extremity Angiography;  Surgeon: Annice Needy, MD;  Location: [facility];  Service: Cardiovascular;  Laterality: Right;  . PERIPHERAL VASCULAR CATHETERIZATION Right 06/21/2015   Procedure: Lower Extremity  Angiography;  Surgeon: Annice Needy, MD;  Location: [facility];  Service: Cardiovascular;  Laterality: Right;  . PERIPHERAL VASCULAR CATHETERIZATION Right 06/21/2015   Procedure: Lower Extremity Intervention;  Surgeon: Annice Needy, MD;  Location: [facility];  Service: Cardiovascular;  Laterality: Right;  . PERIPHERAL VASCULAR CATHETERIZATION Right 10/12/2015   Procedure: Lower Extremity Angiography;  Surgeon: Annice Needy, MD;  Location: [facility];  Service: Cardiovascular;  Laterality: Right;  . PERIPHERAL VASCULAR CATHETERIZATION Right 10/12/2015   Procedure: Lower Extremity Intervention;  Surgeon: Annice Needy, MD;  Location: [facility];  Service: Cardiovascular;  Laterality: Right;  . PERIPHERAL VASCULAR CATHETERIZATION Left 02/10/2016   Procedure: Lower Extremity Angiography;  Surgeon: Annice Needy, MD;  Location: [facility];  Service: Cardiovascular;  Laterality: Left;  . PERIPHERAL VASCULAR CATHETERIZATION  02/10/2016   Procedure: Lower Extremity Intervention;  Surgeon: Annice Needy, MD;  Location: [facility];  Service: Cardiovascular;;  . PERIPHERAL VASCULAR CATHETERIZATION Right 02/24/2016   Procedure: Lower Extremity Angiography;  Surgeon: Annice Needy, MD;  Location: [facility];  Service: Cardiovascular;  Laterality: Right;  . SHOULDER CLOSED REDUCTION Right 04/10/2016   Procedure: CLOSED REDUCTION SHOULDER;  Surgeon: Kennedy Bucker, MD;  Location: [facility];  Service: Orthopedics;  Laterality: Right;  . TONSILLECTOMY    . TOTAL KNEE ARTHROPLASTY  2015  . VAGINAL HYSTERECTOMY      Social History  Substance Use Topics  . Smoking status: Former Smoker    Packs/day: 1.00    Years: 10.00    Types: Cigarettes    Quit date: 02/23/1981  . Smokeless tobacco: Never Used  . Alcohol use No     Comment: rare occ    Family History      Family History  Problem Relation Age of Onset  . Alcohol abuse Father   .  Cancer Father   . Diabetes Paternal Grandfather   . Breast cancer Neg Hx          Allergies  Allergen Reactions  . Penicillins Anaphylaxis    Has patient had a PCN reaction causing immediate rash, facial/tongue/throat swelling, SOB or lightheadedness with hypotension: Yes Has patient had a PCN reaction causing severe rash involving mucus membranes or skin necrosis: No Has patient had a PCN reaction that required hospitalization No Has patient had a PCN reaction occurring within the last 10 years: No If all of the above answers are "NO", then may proceed with Cephalosporin use.   . Statins     Cramps in legs  . Lipitor [Atorvastatin]  REVIEW OF SYSTEMS(Negative unless checked)  Constitutional: [] Weight loss[] Fever[] Chills Cardiac:[] Chest pain[] Chest pressure[] Palpitations [] Shortness of breath when laying flat [] Shortness of breath at rest [] Shortness of breath with exertion. Vascular: [x] Pain in legs with walking[] Pain in legsat rest[] Pain in legs when laying flat [x] Claudication [] Pain in feet when walking [] Pain in feet at rest [] Pain in feet when laying flat [] History of DVT [] Phlebitis [] Swelling in legs [] Varicose veins [] Non-healing ulcers Pulmonary: [] Uses home oxygen [] Productive cough[] Hemoptysis [] Wheeze [] COPD [] Asthma Neurologic: [] Dizziness [] Blackouts [] Seizures [] History of stroke [] History of TIA[] Aphasia [] Temporary blindness[] Dysphagia [] Weaknessor numbness in arms [x] Weakness or numbnessin legsX memory loss worsening Musculoskeletal: [] Arthritis [] Joint swelling [] Joint pain [] Low back pain Hematologic:[] Easy bruising[] Easy bleeding [] Hypercoagulable state [] Anemic  Gastrointestinal:[] Blood in stool[] Vomiting blood[] Gastroesophageal reflux/heartburn[] Abdominal pain Genitourinary: [] Chronic kidney disease [] Difficulturination  [] Frequenturination [] Burning with urination[] Hematuria Skin: [] Rashes [] Ulcers [] Wounds Psychological: [x] History of anxiety[] History of major depression.    Physical Examination  BP (!) 175/90 (BP Location: Right Arm)   Pulse 81   Wt 153 lb 6.4 oz (69.6 kg)   HC 16" (40.6 cm)   BMI 24.76 kg/m  Gen:  WD/WN, NAD. Appears younger than stated age. Head: [place]/AT, No temporalis wasting. Ear/Nose/Throat: Hearing grossly intact, nares w/o erythema or drainage Eyes: Conjunctiva clear. Sclera non-icteric Neck: Supple.  Trachea midline Pulmonary:  Good air movement, no use of accessory muscles.  Cardiac: RRR, no JVD Vascular:  Vessel Right Left  Radial Palpable Palpable                                   Gastrointestinal: soft, non-tender/non-distended. No guarding/reflex.  Musculoskeletal: M/S 5/5 throughout.  No deformity or atrophy. No lower extremity edema. Neurologic: Sensation grossly intact in extremities.  Symmetrical.  Speech is fluent. Quite forgetful. Psychiatric: Judgment intact, Mood & affect appropriate for pt's clinical situation. Dermatologic: No rashes or ulcers noted.  No cellulitis or open wounds.       Labs No results found for this or any previous visit (from the past 2160 hour(s)).  Radiology No results found.  Assessment/Plan Hypertension blood pressure control important in reducing the progression of atherosclerotic disease. On appropriate oral medications.   Dementia in Alzheimer's disease Seems to be reasonably stable today  Hyperlipidemia lipid control important in reducing the progression of atherosclerotic disease. Continue statin therapy  Atherosclerosis of native arteries of extremity with intermittent claudication (HCC) Her ABIs today are slightly improved on the right at 0.53 and stable on the left at 0.89. She has undergone a number of procedures and her perfusion is currently stable.  I do not feel like further  procedures are likely going to be of much benefit and as long as her symptoms are tolerable, I believe she should continue Eliquis, 81 mg of aspirin, and Crestor daily.  She is on a low dose of Crestor if she had intolerance to higher doses.  We will recheck her perfusion in 54-month intervals where she will contact our office sooner with problems in the interim.    Festus Barren, MD  01/11/2018 9:47 AM    This note was created with Dragon medical transcription system.  Any errors from dictation are purely unintentional

## 2018-04-12 ENCOUNTER — Ambulatory Visit (INDEPENDENT_AMBULATORY_CARE_PROVIDER_SITE_OTHER): Payer: Medicare Other | Admitting: Vascular Surgery

## 2018-04-12 ENCOUNTER — Encounter (INDEPENDENT_AMBULATORY_CARE_PROVIDER_SITE_OTHER): Payer: Medicare Other

## 2018-05-03 ENCOUNTER — Ambulatory Visit (INDEPENDENT_AMBULATORY_CARE_PROVIDER_SITE_OTHER): Payer: Medicare Other | Admitting: Vascular Surgery

## 2018-05-03 ENCOUNTER — Ambulatory Visit (INDEPENDENT_AMBULATORY_CARE_PROVIDER_SITE_OTHER): Payer: Medicare Other

## 2018-05-03 ENCOUNTER — Encounter (INDEPENDENT_AMBULATORY_CARE_PROVIDER_SITE_OTHER): Payer: Self-pay | Admitting: Vascular Surgery

## 2018-05-03 VITALS — BP 215/97 | HR 96 | Resp 19 | Ht 63.5 in | Wt 152.0 lb

## 2018-05-03 DIAGNOSIS — I6523 Occlusion and stenosis of bilateral carotid arteries: Secondary | ICD-10-CM | POA: Diagnosis not present

## 2018-05-03 DIAGNOSIS — I10 Essential (primary) hypertension: Secondary | ICD-10-CM | POA: Diagnosis not present

## 2018-05-03 DIAGNOSIS — F0280 Dementia in other diseases classified elsewhere without behavioral disturbance: Secondary | ICD-10-CM

## 2018-05-03 DIAGNOSIS — G309 Alzheimer's disease, unspecified: Secondary | ICD-10-CM

## 2018-05-03 DIAGNOSIS — E785 Hyperlipidemia, unspecified: Secondary | ICD-10-CM | POA: Diagnosis not present

## 2018-05-03 DIAGNOSIS — I739 Peripheral vascular disease, unspecified: Secondary | ICD-10-CM

## 2018-05-03 NOTE — Progress Notes (Signed)
MRN : [id number]  Ashley Hill is a 83 y.o. ([birth year]) female who presents with chief complaint of  Chief Complaint  Patient presents with  . Follow-up  .  History of Present Illness: Patient returns today in follow-up of her carotid disease.  She was seen within the last few months for her PAD which has been stable.  She reports no major issues or problems since her last visit.  She still has numbness in her right foot and some claudication symptoms which are stable.  No focal neurologic events.  Her carotid duplex today shows stable, mild carotid artery stenosis in the 1 to 39% range bilaterally.  Current Outpatient Medications  Medication Sig Dispense Refill  . Alpha-Lipoic Acid 600 MG CAPS Take 600 mg by mouth daily.    Marland Kitchen amLODipine (NORVASC) 5 MG tablet Take 5 mg by mouth daily.    Marland Kitchen apixaban (ELIQUIS) 5 MG TABS tablet Take 1 tablet (5 mg total) by mouth 2 (two) times daily. 60 tablet 2  . b complex vitamins tablet Take 1 tablet by mouth daily.    . Calcium Carbonate-Vitamin D (CALCIUM-VITAMIN D3 PO) Take 1 tablet by mouth 2 (two) times daily.    . carvedilol (COREG) 25 MG tablet Take 25 mg by mouth 2 (two) times daily with a meal.    . Cholecalciferol (VITAMIN D) 2000 units CAPS Take 2,000 Units by mouth daily.    . Coenzyme Q10 (COQ10) 50 MG CAPS Take 50 mg by mouth daily.     . CRESTOR 5 MG tablet TAKE 1 TABLET BY MOUTH EVERY DAY (Patient taking differently: TAKE 5 MG BY MOUTH EVERY DAY) 30 tablet 6  . DULoxetine (CYMBALTA) 30 MG capsule Take 30 mg by mouth daily.    . hydrochlorothiazide (HYDRODIURIL) 25 MG tablet TAKE 1 TABLET BY MOUTH EVERY DAY 30 tablet 3  . levETIRAcetam (KEPPRA) 250 MG tablet Take 250 mg by mouth 2 (two) times daily.     Marland Kitchen levETIRAcetam (KEPPRA) 250 MG tablet TAKE 1 TABLET BY MOUTH TWICE DAILY    . pregabalin (LYRICA) 100 MG capsule Take 100 mg by mouth at bedtime.    . Turmeric 450 MG CAPS Take 450 mg by mouth daily.    Marland Kitchen aspirin 81 MG tablet Take 81 mg  by mouth daily.    Marland Kitchen donepezil (ARICEPT) 10 MG tablet Take 10 mg by mouth at bedtime.    . traMADol (ULTRAM) 50 MG tablet Take 1 tablet (50 mg total) by mouth every 6 (six) hours as needed. (Patient not taking: Reported on 05/03/2018) 30 tablet 0   No current facility-administered medications for this visit.     Past Medical History:  Diagnosis Date  . Dyslipidemia   . Dysrhythmia   . Ectopic atrial tachycardia (HCC)   . Environmental allergies   . H/O scarlet fever   . Heart murmur    IN PAST  . HOH (hard of hearing)   . Hyperlipidemia   . Hypertension   . Peripheral vascular disease (HCC)   . 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 Harvie Bridge, [place].  . Transient cerebral ischemia     Past Surgical History:  Procedure Laterality Date  . ABLATION OF DYSRHYTHMIC FOCUS    . AUGMENTATION MAMMAPLASTY Bilateral   . CATARACT EXTRACTION W/PHACO Left 08/28/2017   Procedure: CATARACT EXTRACTION PHACO AND INTRAOCULAR LENS PLACEMENT (IOC);  Surgeon: Galen Manila, MD;  Location: [facility];  Service: Ophthalmology;  Laterality: Left;  [place] 00:26 AP% 18.4 CDE 4.85 Fluid pa ck lot # [id number]  . CORONARY ANGIOPLASTY    . ENDARTERECTOMY FEMORAL Right 08/07/2016   Procedure: ENDARTERECTOMY FEMORAL;  Surgeon: Annice Needy, MD;  Location: [facility];  Service: Vascular;  Laterality: Right;  . EYE SURGERY    . JOINT REPLACEMENT     knee  . KNEE ARTHROSCOPY W/ MENISCAL REPAIR     Right   . PERIPHERAL VASCULAR BALLOON ANGIOPLASTY N/A 07/26/2016   Procedure: Peripheral Vascular Balloon Angioplasty;  Surgeon: Annice Needy, MD;  Location: [facility];  Service: Cardiovascular;  Laterality: N/A;  . PERIPHERAL VASCULAR CATHETERIZATION Right 01/11/2015   Procedure: Lower Extremity Angiography;  Surgeon: Annice Needy, MD;  Location: [facility];  Service: Cardiovascular;  Laterality: Right;  . PERIPHERAL VASCULAR  CATHETERIZATION Right 06/21/2015   Procedure: Lower Extremity Angiography;  Surgeon: Annice Needy, MD;  Location: [facility];  Service: Cardiovascular;  Laterality: Right;  . PERIPHERAL VASCULAR CATHETERIZATION Right 06/21/2015   Procedure: Lower Extremity Intervention;  Surgeon: Annice Needy, MD;  Location: [facility];  Service: Cardiovascular;  Laterality: Right;  . PERIPHERAL VASCULAR CATHETERIZATION Right 10/12/2015   Procedure: Lower Extremity Angiography;  Surgeon: Annice Needy, MD;  Location: [facility];  Service: Cardiovascular;  Laterality: Right;  . PERIPHERAL VASCULAR CATHETERIZATION Right 10/12/2015   Procedure: Lower Extremity Intervention;  Surgeon: Annice Needy, MD;  Location: [facility];  Service: Cardiovascular;  Laterality: Right;  . PERIPHERAL VASCULAR CATHETERIZATION Left 02/10/2016   Procedure: Lower Extremity Angiography;  Surgeon: Annice Needy, MD;  Location: [facility];  Service: Cardiovascular;  Laterality: Left;  . PERIPHERAL VASCULAR CATHETERIZATION  02/10/2016   Procedure: Lower Extremity Intervention;  Surgeon: Annice Needy, MD;  Location: [facility];  Service: Cardiovascular;;  . PERIPHERAL VASCULAR CATHETERIZATION Right 02/24/2016   Procedure: Lower Extremity Angiography;  Surgeon: Annice Needy, MD;  Location: [facility];  Service: Cardiovascular;  Laterality: Right;  . SHOULDER CLOSED REDUCTION Right 04/10/2016   Procedure: CLOSED REDUCTION SHOULDER;  Surgeon: Kennedy Bucker, MD;  Location: [facility];  Service: Orthopedics;  Laterality: Right;  . TONSILLECTOMY    . TOTAL KNEE ARTHROPLASTY  2015  . VAGINAL HYSTERECTOMY      Social History  Substance Use Topics  . Smoking status: Former Smoker    Packs/day: 1.00    Years: 10.00    Types: Cigarettes    Quit date: 02/23/1981  . Smokeless tobacco: Never Used  . Alcohol use No     Comment: rare occ    Family History      Family History    Problem Relation Age of Onset  . Alcohol abuse Father   . Cancer Father   . Diabetes Paternal Grandfather   . Breast cancer Neg Hx          Allergies  Allergen Reactions  . Penicillins Anaphylaxis    Has patient had a PCN reaction causing immediate rash, facial/tongue/throat swelling, SOB or lightheadedness with hypotension: Yes Has patient had a PCN reaction causing severe rash involving mucus membranes or skin necrosis: No Has patient had a PCN reaction that required hospitalization No Has patient had a PCN reaction occurring within the last 10 years: No If all of the above answers are "NO", then may proceed with Cephalosporin use.   . Statins  Cramps in legs  . Lipitor [Atorvastatin]      REVIEW OF SYSTEMS(Negative unless checked)  Constitutional: [] ?Weight loss[] ?Fever[] ?Chills Cardiac:[] ?Chest pain[] ?Chest pressure[] ?Palpitations [] ?Shortness of breath when laying flat [] ?Shortness of breath at rest [] ?Shortness of breath with exertion. Vascular: [x] ?Pain in legs with walking[] ?Pain in legsat rest[] ?Pain in legs when laying flat [x] ?Claudication [] ?Pain in feet when walking [] ?Pain in feet at rest [] ?Pain in feet when laying flat [] ?History of DVT [] ?Phlebitis [] ?Swelling in legs [] ?Varicose veins [] ?Non-healing ulcers Pulmonary: [] ?Uses home oxygen [] ?Productive cough[] ?Hemoptysis [] ?Wheeze [] ?COPD [] ?Asthma Neurologic: [] ?Dizziness [] ?Blackouts [] ?Seizures [] ?History of stroke [] ?History of TIA[] ?Aphasia [] ?Temporary blindness[] ?Dysphagia [] ?Weaknessor numbness in arms [x] ?Weakness or numbnessin legsX memory loss worsening Musculoskeletal: [] ?Arthritis [] ?Joint swelling [] ?Joint pain [] ?Low back pain Hematologic:[] ?Easy bruising[] ?Easy bleeding [] ?Hypercoagulable state [] ?Anemic  Gastrointestinal:[] ?Blood in stool[] ?Vomiting blood[] ?Gastroesophageal  reflux/heartburn[] ?Abdominal pain Genitourinary: [] ?Chronic kidney disease [] ?Difficulturination [] ?Frequenturination [] ?Burning with urination[] ?Hematuria Skin: [] ?Rashes [] ?Ulcers [] ?Wounds Psychological: [x] ?History of anxiety[] ?History of major depression.    Physical Examination  Vitals:   05/03/18 1017  BP: (!) 215/97  Pulse: 96  Resp: 19  Weight: 152 lb (68.9 kg)  Height: 5' 3.5" (1.613 m)   Body mass index is 26.5 kg/m. Gen:  WD/WN, NAD.  Appears younger than stated age Head: [place]/AT, No temporalis wasting. Ear/Nose/Throat: Hearing grossly intact, nares w/o erythema or drainage, trachea midline Eyes: Conjunctiva clear. Sclera non-icteric Neck: Supple.  No bruit  Pulmonary:  Good air movement, equal and clear to auscultation bilaterally.  Cardiac: Tachycardic but regular Vascular:  Vessel Right Left  Radial Palpable Palpable                          PT  not palpable  1+ palpable  DP  not palpable  1+ palpable    Musculoskeletal: M/S 5/5 throughout.  No deformity or atrophy.  Mild right lower extremity edema. Neurologic: CN 2-12 intact. Sensation grossly intact in extremities.  Symmetrical.  Speech is fluent. Motor exam as listed above. Psychiatric: Judgment intact, Mood & affect appropriate for pt's clinical situation. Dermatologic: No rashes or ulcers noted.  No cellulitis or open wounds.      CBC Lab Results  Component Value Date   WBC 12.2 (H) 08/08/2016   HGB 11.8 (L) 08/08/2016   HCT 33.9 (L) 08/08/2016   MCV 90.1 08/08/2016   PLT 240 08/08/2016    BMET    Component Value Date/Time   NA 136 08/08/2016 0018   NA 142 04/29/2013 1023   K 3.1 (L) 08/08/2016 0018   CL 101 08/08/2016 0018   CO2 27 08/08/2016 0018   GLUCOSE 160 (H) 08/08/2016 0018   BUN 18 08/08/2016 0018   BUN 22 04/29/2013 1023   CREATININE 0.46 08/08/2016 0018   CALCIUM 8.8 (L) 08/08/2016 0018   GFRNONAA >60 08/08/2016 0018   GFRAA >60 08/08/2016  0018   CrCl cannot be calculated (Patient's most recent lab result is older than the maximum 21 days allowed.).  COAG Lab Results  Component Value Date   INR 1.06 08/04/2016   INR 0.99 07/25/2016   INR 1.0 12/09/2013    Radiology No results found.   Assessment/Plan Hypertension blood pressure control important in reducing the progression of atherosclerotic disease. On appropriate oral medications.   Dementia in Alzheimer's disease Seems to bereasonably stable today  Hyperlipidemia lipid control important in reducing the progression of atherosclerotic disease. Continue statin therapy  PAD (peripheral artery disease) (HCC) Right leg symptoms stable.  To be checked next year  Bilateral carotid  artery stenosis Her carotid duplex today shows stable, mild carotid artery stenosis in the 1 to 39% range bilaterally. Plan to recheck in one to two years.  Continue current medical regimen    Festus BarrenJason Dew, MD  05/03/2018 10:54 AM    This note was created with Dragon medical transcription system.  Any errors from dictation are purely unintentional

## 2018-05-03 NOTE — Assessment & Plan Note (Signed)
Her carotid duplex today shows stable, mild carotid artery stenosis in the 1 to 39% range bilaterally. Plan to recheck in one to two years.  Continue current medical regimen

## 2018-05-03 NOTE — Assessment & Plan Note (Signed)
Right leg symptoms stable.  To be checked next year

## 2018-05-06 ENCOUNTER — Telehealth (INDEPENDENT_AMBULATORY_CARE_PROVIDER_SITE_OTHER): Payer: Self-pay

## 2018-05-06 NOTE — Telephone Encounter (Signed)
Patient's daughter called to request samples of Eliquis for the patient. The patient had an appt on Friday with Dr. Wyn Quakerew and forgot to ask for samples, she is getting assistance but they will not renew her assistance until 05/16/18. She requested a one week sample. This will be placed at the front desk for pick up. One box of 5mg  Eliquis.

## 2018-05-21 ENCOUNTER — Telehealth (INDEPENDENT_AMBULATORY_CARE_PROVIDER_SITE_OTHER): Payer: Self-pay

## 2018-05-21 NOTE — Telephone Encounter (Signed)
Called the patient's daughter and left a message regarding information about her Eliquis. The only paperwork we received was a letter stating they were reviewing the application and would let the patient know.

## 2018-05-21 NOTE — Telephone Encounter (Signed)
Patient assistance only faxed over that they had received the information and would contact the patient and send a letter after the decision was made. We got the same letter that they sent the patient.

## 2018-05-21 NOTE — Telephone Encounter (Signed)
Patients daughter, Jessia, called and left a message on the triage line and stated that her mother was told her patient assistance for Eliquis was sent to the office and wants to know has it arrived yet. Please advise and they would like a call back at [phone] .

## 2018-05-22 NOTE — Telephone Encounter (Signed)
Spoke with the patient's daughter, she left a message that a new prescription for Eliquis was needed. I let the daughter know the prescription would be at the front desk.

## 2018-06-14 DIAGNOSIS — E538 Deficiency of other specified B group vitamins: Secondary | ICD-10-CM | POA: Insufficient documentation

## 2018-07-04 ENCOUNTER — Ambulatory Visit
Admission: RE | Admit: 2018-07-04 | Discharge: 2018-07-04 | Disposition: A | Discharge status: Home / Self Care | Payer: Medicare Other | Source: Ambulatory Visit | Attending: Internal Medicine | Admitting: Internal Medicine

## 2018-07-04 DIAGNOSIS — Z1231 Encounter for screening mammogram for malignant neoplasm of breast: Secondary | ICD-10-CM | POA: Insufficient documentation

## 2018-07-15 ENCOUNTER — Ambulatory Visit (INDEPENDENT_AMBULATORY_CARE_PROVIDER_SITE_OTHER): Payer: Medicare Other | Admitting: Podiatry

## 2018-07-15 ENCOUNTER — Encounter: Payer: Self-pay | Admitting: Podiatry

## 2018-07-15 ENCOUNTER — Other Ambulatory Visit: Payer: Self-pay | Admitting: Podiatry

## 2018-07-15 ENCOUNTER — Ambulatory Visit (INDEPENDENT_AMBULATORY_CARE_PROVIDER_SITE_OTHER): Payer: Medicare Other

## 2018-07-15 DIAGNOSIS — G609 Hereditary and idiopathic neuropathy, unspecified: Secondary | ICD-10-CM

## 2018-07-15 DIAGNOSIS — Q828 Other specified congenital malformations of skin: Secondary | ICD-10-CM | POA: Diagnosis not present

## 2018-07-15 DIAGNOSIS — R269 Unspecified abnormalities of gait and mobility: Secondary | ICD-10-CM | POA: Insufficient documentation

## 2018-07-15 DIAGNOSIS — M25569 Pain in unspecified knee: Secondary | ICD-10-CM | POA: Insufficient documentation

## 2018-07-15 DIAGNOSIS — M779 Enthesopathy, unspecified: Secondary | ICD-10-CM

## 2018-07-15 DIAGNOSIS — Z9889 Other specified postprocedural states: Secondary | ICD-10-CM | POA: Insufficient documentation

## 2018-07-15 DIAGNOSIS — M778 Other enthesopathies, not elsewhere classified: Secondary | ICD-10-CM

## 2018-07-15 DIAGNOSIS — M25669 Stiffness of unspecified knee, not elsewhere classified: Secondary | ICD-10-CM | POA: Insufficient documentation

## 2018-07-15 DIAGNOSIS — G629 Polyneuropathy, unspecified: Secondary | ICD-10-CM

## 2018-07-15 NOTE — Progress Notes (Signed)
Subjective:  Patient ID: Ashley Hill, female    DOB: Jan 21, 1935,  MRN: [id number] HPI Chief Complaint  Patient presents with  . Foot Pain    Patient presents today for bilat foot pain right being worse than left.  She states "My feet are contstantly burning and hurt all the time"  she reports the pains are in her arches to her forfeet and Lyrica is not helping with her pain.    84 y.o. female presents with the above complaint.   ROS: Denies fever chills nausea vomiting muscle aches pains calf pain back pain chest pain shortness of breath.  Granddaughter Santina Evans, states that there are wine bottles found on a daily basis around her grandmother's home.  Past Medical History:  Diagnosis Date  . Dyslipidemia   . Dysrhythmia   . Ectopic atrial tachycardia (HCC)   . Environmental allergies   . H/O scarlet fever   . Heart murmur    IN PAST  . HOH (hard of hearing)   . Hyperlipidemia   . Hypertension   . Peripheral vascular disease (HCC)   . 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 Harvie Bridge, [place].  . Transient cerebral ischemia    Past Surgical History:  Procedure Laterality Date  . ABLATION OF DYSRHYTHMIC FOCUS    . AUGMENTATION MAMMAPLASTY Bilateral   . CATARACT EXTRACTION W/PHACO Left 08/28/2017   Procedure: CATARACT EXTRACTION PHACO AND INTRAOCULAR LENS PLACEMENT (IOC);  Surgeon: Galen Manila, MD;  Location: [facility];  Service: Ophthalmology;  Laterality: Left;  [place] 00:26 AP% 18.4 CDE 4.85 Fluid pa ck lot # [id number]  . CORONARY ANGIOPLASTY    . ENDARTERECTOMY FEMORAL Right 08/07/2016   Procedure: ENDARTERECTOMY FEMORAL;  Surgeon: Annice Needy, MD;  Location: [facility];  Service: Vascular;  Laterality: Right;  . EYE SURGERY    . JOINT REPLACEMENT     knee  . KNEE ARTHROSCOPY W/ MENISCAL REPAIR     Right   . PERIPHERAL VASCULAR BALLOON ANGIOPLASTY N/A 07/26/2016   Procedure: Peripheral Vascular Balloon  Angioplasty;  Surgeon: Annice Needy, MD;  Location: [facility];  Service: Cardiovascular;  Laterality: N/A;  . PERIPHERAL VASCULAR CATHETERIZATION Right 01/11/2015   Procedure: Lower Extremity Angiography;  Surgeon: Annice Needy, MD;  Location: [facility];  Service: Cardiovascular;  Laterality: Right;  . PERIPHERAL VASCULAR CATHETERIZATION Right 06/21/2015   Procedure: Lower Extremity Angiography;  Surgeon: Annice Needy, MD;  Location: [facility];  Service: Cardiovascular;  Laterality: Right;  . PERIPHERAL VASCULAR CATHETERIZATION Right 06/21/2015   Procedure: Lower Extremity Intervention;  Surgeon: Annice Needy, MD;  Location: [facility];  Service: Cardiovascular;  Laterality: Right;  . PERIPHERAL VASCULAR CATHETERIZATION Right 10/12/2015   Procedure: Lower Extremity Angiography;  Surgeon: Annice Needy, MD;  Location: [facility];  Service: Cardiovascular;  Laterality: Right;  . PERIPHERAL VASCULAR CATHETERIZATION Right 10/12/2015   Procedure: Lower Extremity Intervention;  Surgeon: Annice Needy, MD;  Location: [facility];  Service: Cardiovascular;  Laterality: Right;  . PERIPHERAL VASCULAR CATHETERIZATION Left 02/10/2016   Procedure: Lower Extremity Angiography;  Surgeon: Annice Needy, MD;  Location: [facility];  Service: Cardiovascular;  Laterality: Left;  . PERIPHERAL VASCULAR CATHETERIZATION  02/10/2016   Procedure: Lower Extremity Intervention;  Surgeon: Annice Needy, MD;  Location: [facility];  Service: Cardiovascular;;  . PERIPHERAL VASCULAR CATHETERIZATION  Right 02/24/2016   Procedure: Lower Extremity Angiography;  Surgeon: Annice Needy, MD;  Location: [facility];  Service: Cardiovascular;  Laterality: Right;  . SHOULDER CLOSED REDUCTION Right 04/10/2016   Procedure: CLOSED REDUCTION SHOULDER;  Surgeon: Kennedy Bucker, MD;  Location: [facility];  Service: Orthopedics;  Laterality: Right;  . TONSILLECTOMY    . TOTAL KNEE  ARTHROPLASTY  2015  . VAGINAL HYSTERECTOMY      Current Outpatient Medications:  .  Alpha-Lipoic Acid 600 MG CAPS, Take 600 mg by mouth daily., Disp: , Rfl:  .  amLODipine (NORVASC) 5 MG tablet, Take 5 mg by mouth daily., Disp: , Rfl:  .  apixaban (ELIQUIS) 5 MG TABS tablet, Take 1 tablet (5 mg total) by mouth 2 (two) times daily., Disp: 60 tablet, Rfl: 2 .  aspirin 81 MG tablet, Take 81 mg by mouth daily., Disp: , Rfl:  .  b complex vitamins tablet, Take 1 tablet by mouth daily., Disp: , Rfl:  .  Calcium Carbonate-Vitamin D (CALCIUM-VITAMIN D3 PO), Take 1 tablet by mouth 2 (two) times daily., Disp: , Rfl:  .  carvedilol (COREG) 25 MG tablet, Take 25 mg by mouth 2 (two) times daily with a meal., Disp: , Rfl:  .  Cholecalciferol (VITAMIN D) 2000 units CAPS, Take 2,000 Units by mouth daily., Disp: , Rfl:  .  Coenzyme Q10 (COQ10) 50 MG CAPS, Take 50 mg by mouth daily. , Disp: , Rfl:  .  CRESTOR 5 MG tablet, TAKE 1 TABLET BY MOUTH EVERY DAY (Patient taking differently: TAKE 5 MG BY MOUTH EVERY DAY), Disp: 30 tablet, Rfl: 6 .  donepezil (ARICEPT) 10 MG tablet, Take 10 mg by mouth at bedtime., Disp: , Rfl:  .  hydrochlorothiazide (HYDRODIURIL) 25 MG tablet, TAKE 1 TABLET BY MOUTH EVERY DAY, Disp: 30 tablet, Rfl: 3 .  levETIRAcetam (KEPPRA) 250 MG tablet, Take 250 mg by mouth 2 (two) times daily. , Disp: , Rfl:  .  pregabalin (LYRICA) 100 MG capsule, Take 100 mg by mouth at bedtime., Disp: , Rfl:  .  traMADol (ULTRAM) 50 MG tablet, Take 1 tablet (50 mg total) by mouth every 6 (six) hours as needed. (Patient not taking: Reported on 05/03/2018), Disp: 30 tablet, Rfl: 0 .  Turmeric 450 MG CAPS, Take 450 mg by mouth daily., Disp: , Rfl:   Allergies  Allergen Reactions  . Penicillins Anaphylaxis and Other (See Comments)    Has patient had a PCN reaction causing immediate rash, facial/tongue/throat swelling, SOB or lightheadedness with hypotension: Yes Has patient had a PCN reaction causing severe rash  involving mucus membranes or skin necrosis: No Has patient had a PCN reaction that required hospitalization No Has patient had a PCN reaction occurring within the last 10 years: No If all of the above answers are "NO", then may proceed with Cephalosporin use.   . Lipitor [Atorvastatin] Other (See Comments)    Muscle cramps in legs  . Statins Other (See Comments)    Cramps in legs   Review of Systems Objective:  There were no vitals filed for this visit.  General: Well developed, nourished, in no acute distress, alert and oriented x3   Dermatological: Skin is warm, dry and supple bilateral. Nails x 10 are well maintained; remaining integument appears unremarkable at this time. There are no open sores, no preulcerative lesions, no rash or signs of infection present.  Reactive hyperkeratotic lesions plantar aspect of the second metatarsal phalangeal joints bilateral.  Vascular: Dorsalis  Pedis artery and Posterior Tibial artery pedal pulses are 2/4 bilateral with immedate capillary fill time. Pedal hair growth present. No varicosities and no lower extremity edema present bilateral.   Neruologic: Grossly intact via light touch bilateral. Vibratory intact via tuning fork bilateral. Protective threshold with Semmes Wienstein monofilament intact to all pedal sites bilateral. Patellar and Achilles deep tendon reflexes 2+ bilateral. No Babinski or clonus noted bilateral.   Musculoskeletal: No gross boney pedal deformities bilateral. No pain, crepitus, or limitation noted with foot and ankle range of motion bilateral. Muscular strength 5/5 in all groups tested bilateral.  She has pain on palpation and end range of motion of the second and third metatarsophalangeal joints of the right foot.  Stating that this is how the pain feels.  Gait: Unassisted, Nonantalgic.    Radiographs:  Radiographs taken today do not demonstrate any type of osseous abnormalities no acute findings.  Assessment & Plan:    Assessment: Discussed etiology pathology conservative surgical therapies at this point cannot rule out neuropathy idiopathic or alcohol.  Possible capsulitis second and third metatarsal phalange joints of the right foot  Plan: After sterile Betadine skin prep I injected around the second and third metatarsophalangeal joints of the right foot with 5 mg of Kenalog 5 mg of Marcaine.  We only injected the right foot so that we can use the left is a comparison follow-up with her in 1 month     Rut Better[place] T. [place], [place]

## 2018-07-16 ENCOUNTER — Ambulatory Visit (INDEPENDENT_AMBULATORY_CARE_PROVIDER_SITE_OTHER): Payer: Medicare Other | Admitting: Vascular Surgery

## 2018-07-16 ENCOUNTER — Encounter (INDEPENDENT_AMBULATORY_CARE_PROVIDER_SITE_OTHER): Payer: Medicare Other

## 2018-08-12 ENCOUNTER — Ambulatory Visit: Payer: Medicare Other | Admitting: Podiatry

## 2018-09-11 ENCOUNTER — Other Ambulatory Visit: Payer: Self-pay

## 2018-09-11 ENCOUNTER — Ambulatory Visit (INDEPENDENT_AMBULATORY_CARE_PROVIDER_SITE_OTHER): Payer: Medicare Other | Admitting: Podiatry

## 2018-09-11 ENCOUNTER — Encounter: Payer: Self-pay | Admitting: Podiatry

## 2018-09-11 ENCOUNTER — Ambulatory Visit: Payer: Medicare Other | Admitting: Podiatry

## 2018-09-11 VITALS — Temp 97.5°F

## 2018-09-11 DIAGNOSIS — Z9181 History of falling: Secondary | ICD-10-CM | POA: Diagnosis not present

## 2018-09-11 DIAGNOSIS — R2681 Unsteadiness on feet: Secondary | ICD-10-CM

## 2018-09-11 NOTE — Progress Notes (Signed)
She presents today for follow-up of her right foot.  States that the numbness really is not any better but the pain is gone away beneath the second and third metatarsals.  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  Objective: Vital signs are stable alert and oriented x3.  Pulses are barely palpable DP and PT bilateral.  Neurologic sensorium is diminished per Semmes Weinstein monofilament bilaterally.  Deep tendon reflexes are diminished muscle strength is diminished bilaterally.  Assessment: History of peripheral vascular disease and revascularization in the groin.  Neuropathy bilateral.  Right greater than left.  Resolution of capsulitis right foot.  Plan: Discussed etiology pathology conservative versus surgical therapies at this point I see no sense in trying any medication everything is failed and she really just has numbness and wants the numbness to go away.  She has no back pain so I do not see any point of nerve conduction velocity exam her daughter would like to see her balance improved so we will send her for balance training and strengthening class as well as gait training.  Follow-up with her.

## 2018-10-17 DIAGNOSIS — R7309 Other abnormal glucose: Secondary | ICD-10-CM | POA: Insufficient documentation

## 2018-11-14 IMAGING — MR MR SHOULDER*R* W/O CM
5 series · 40 of 40 positions shown · non-contrast
Comparison: Plain films the right humerus shoulder 04/10/2016

CLINICAL DATA: History of fall and right shoulder dislocation
04/10/2016. Pain limited range of motion. Subsequent encounter.

EXAM:
MRI OF THE RIGHT SHOULDER WITHOUT CONTRAST
TECHNIQUE: Multiplanar, multisequence MR imaging of the shoulder was performed.
No intravenous contrast was administered.

[Series 3: T2 fat-sat · axial · 4.0mm · 0.47mm/px · z∈[-53,+35]mm · 8 of 21 slices shown (1 of 3)]
[im 1/21]
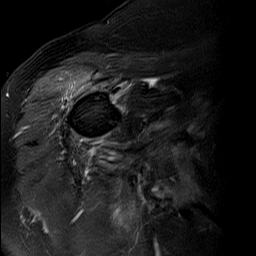
[im 3/21]
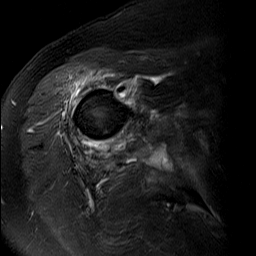
[im 6/21]
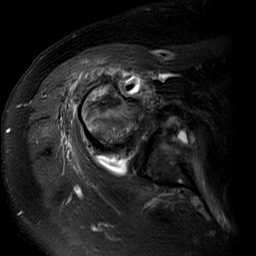
[im 9/21]
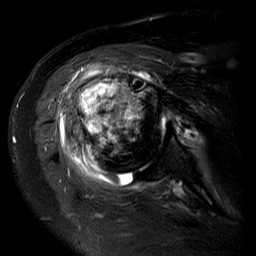
[im 12/21]
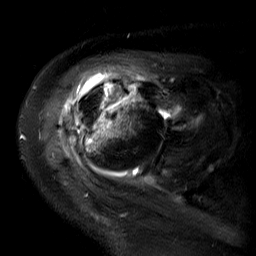
[im 15/21]
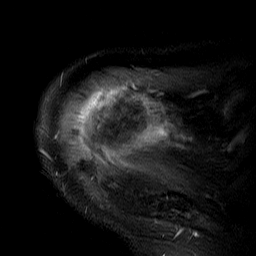
[im 18/21]
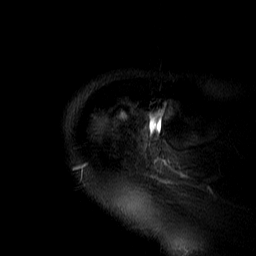
[im 21/21]
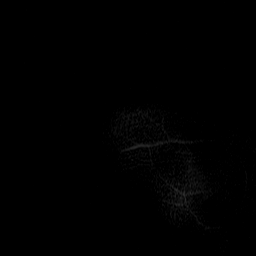

[Series 4: T2 fat-sat · coronal · 4.0mm · 0.62mm/px · 7 of 19 slices shown (2 of 3)]
[im 1/19]
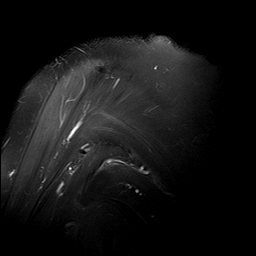
[im 4/19]
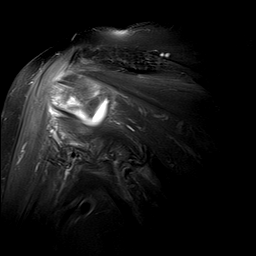
[im 7/19]
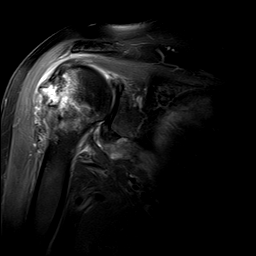
[im 10/19]
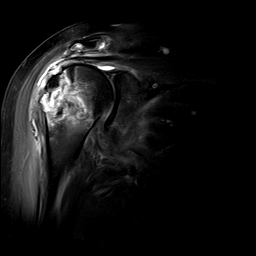
[im 13/19]
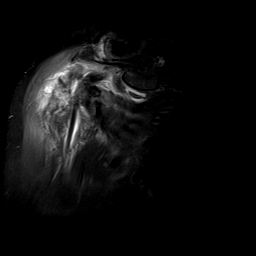
[im 16/19]
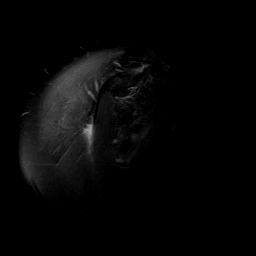
[im 19/19]
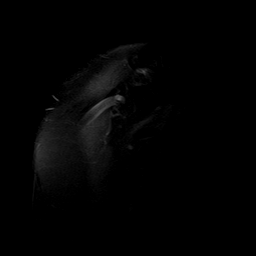

[Series 5: PD · coronal · 4.0mm · 0.62mm/px · 7 of 19 slices shown]
[im 1/19]
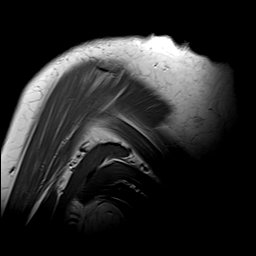
[im 4/19]
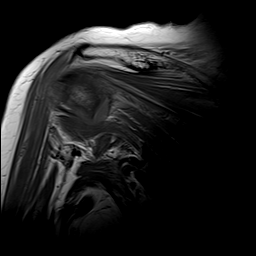
[im 7/19]
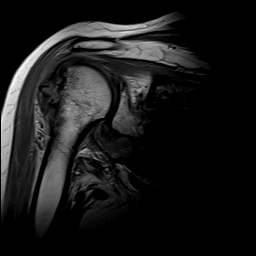
[im 10/19]
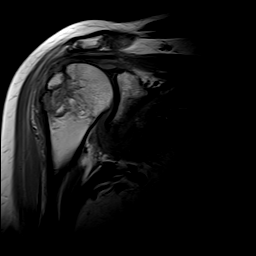
[im 13/19]
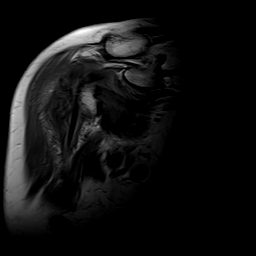
[im 16/19]
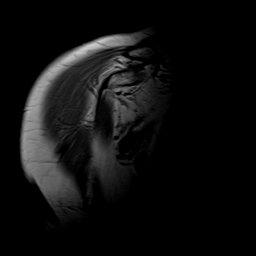
[im 19/19]
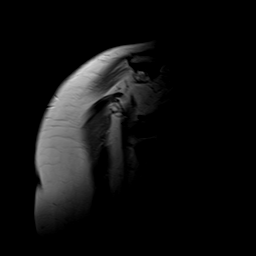

[Series 6: T1 · sagittal · 4.0mm · 0.62mm/px · 9 of 22 slices shown]
[im 1/22]
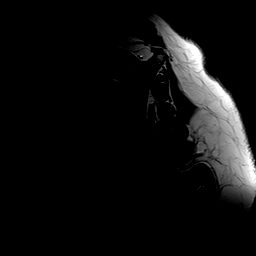
[im 3/22]
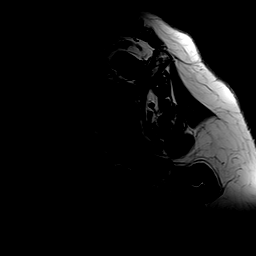
[im 6/22]
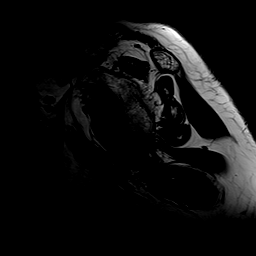
[im 8/22]
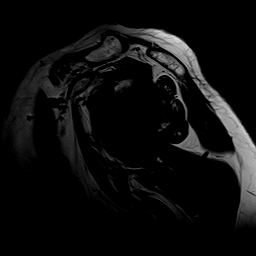
[im 11/22]
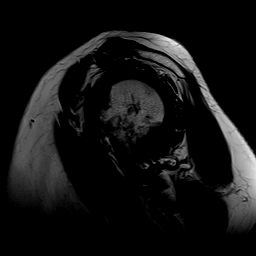
[im 14/22]
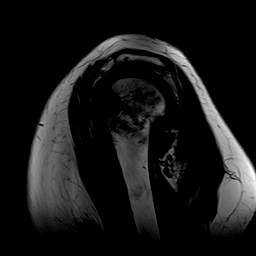
[im 16/22]
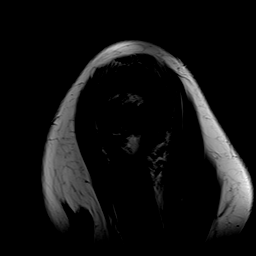
[im 19/22]
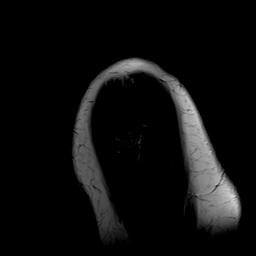
[im 22/22]
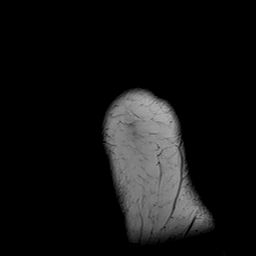

[Series 7: T2 fat-sat · sagittal · 4.0mm · 0.62mm/px · 9 of 22 slices shown (3 of 3)]
[im 1/22]
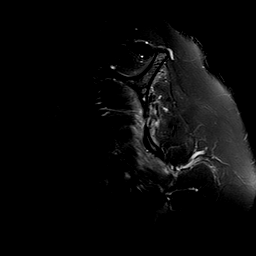
[im 3/22]
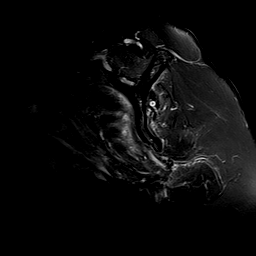
[im 6/22]
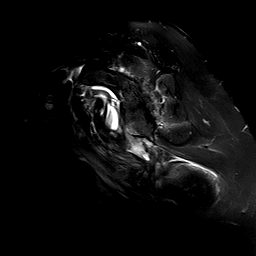
[im 8/22]
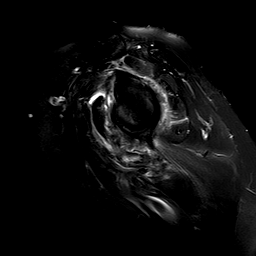
[im 11/22]
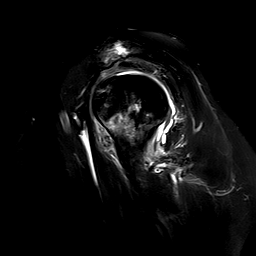
[im 14/22]
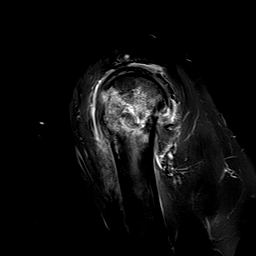
[im 16/22]
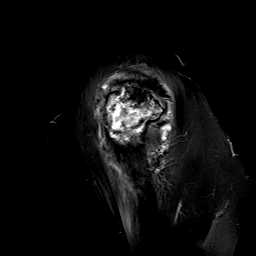
[im 19/22]
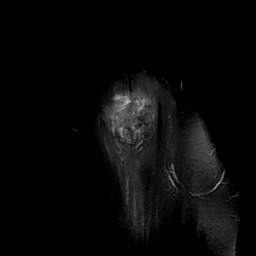
[im 22/22]
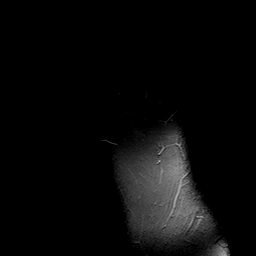

[40 of 40 positions shown; findings below may reference images not displayed]

FINDINGS: Rotator cuff: There is supraspinatus and infraspinatus tendinopathy.
0.3 cm tear of the leading edge of the supraspinatus is identified
without retraction.

Muscles: No atrophy or focal lesion. There is edema and hemorrhage
in the subscapularis due to the patient's shoulder dislocation.

Biceps long head:  Intact.

Acromioclavicular Joint: Mild to moderate degenerative change is
seen. Type 2 acromion. Fluid and hemorrhage are seen in the
subacromial/subdeltoid bursa.

Glenohumeral Joint: The inferior glenohumeral ligament is thickened
and indistinct consistent with sprain. No definite tear is
identified.

Labrum: Tear of the anterior, inferior labrum is identified.
Otherwise intact.

Bones: The patient has a large, comminuted Hill-Sachs lesion with
associated marrow edema. Bone contusion is seen in the anterior,
inferior glenoid without fracture.

Other: None.
IMPRESSION: Large, comminuted Hill-Sachs lesion with associated marrow edema as
seen on the comparison examinations.

Tear of the anterior, inferior labrum and bone contusion in the
anterior, inferior glenoid. No glenoid fracture is identified.

Thickened and indistinct anterior, inferior glenohumeral ligament
consistent with sprain. No definite tear.

Hemorrhagic subacromial/subdeltoid bursitis.

Supraspinatus and infraspinatus tendinopathy with a small tear of
the leading edge of the supraspinatus without retraction or atrophy.

Mild acromioclavicular osteoarthritis.

## 2019-02-24 ENCOUNTER — Ambulatory Visit: Payer: Medicare Other | Admitting: Podiatry

## 2019-06-06 ENCOUNTER — Other Ambulatory Visit: Payer: Self-pay | Admitting: Internal Medicine

## 2019-06-06 DIAGNOSIS — Z1231 Encounter for screening mammogram for malignant neoplasm of breast: Secondary | ICD-10-CM

## 2019-07-11 ENCOUNTER — Other Ambulatory Visit (INDEPENDENT_AMBULATORY_CARE_PROVIDER_SITE_OTHER): Payer: Self-pay | Admitting: Vascular Surgery

## 2019-07-11 NOTE — Telephone Encounter (Signed)
Patient has been schedule to come in 08/05/19 @3pm 

## 2019-07-11 NOTE — Telephone Encounter (Signed)
Patient last seen: 2019

## 2019-08-01 ENCOUNTER — Other Ambulatory Visit: Payer: Self-pay

## 2019-08-01 ENCOUNTER — Ambulatory Visit
Admission: RE | Admit: 2019-08-01 | Discharge: 2019-08-01 | Disposition: A | Discharge status: Home / Self Care | Payer: Medicare Other | Source: Ambulatory Visit | Attending: Physician Assistant | Admitting: Physician Assistant

## 2019-08-01 ENCOUNTER — Other Ambulatory Visit: Payer: Self-pay | Admitting: Physician Assistant

## 2019-08-01 DIAGNOSIS — R1013 Epigastric pain: Secondary | ICD-10-CM

## 2019-08-04 ENCOUNTER — Other Ambulatory Visit: Payer: Self-pay | Admitting: General Surgery

## 2019-08-04 DIAGNOSIS — K810 Acute cholecystitis: Secondary | ICD-10-CM

## 2019-08-05 ENCOUNTER — Other Ambulatory Visit: Payer: Self-pay | Admitting: General Surgery

## 2019-08-05 ENCOUNTER — Other Ambulatory Visit: Payer: Self-pay

## 2019-08-05 ENCOUNTER — Ambulatory Visit (INDEPENDENT_AMBULATORY_CARE_PROVIDER_SITE_OTHER): Payer: Medicare Other

## 2019-08-05 ENCOUNTER — Ambulatory Visit
Admission: RE | Admit: 2019-08-05 | Discharge: 2019-08-05 | Disposition: A | Discharge status: Home / Self Care | Payer: Medicare Other | Source: Ambulatory Visit | Attending: General Surgery | Admitting: General Surgery

## 2019-08-05 ENCOUNTER — Ambulatory Visit (INDEPENDENT_AMBULATORY_CARE_PROVIDER_SITE_OTHER): Payer: Medicare Other | Admitting: Vascular Surgery

## 2019-08-05 ENCOUNTER — Telehealth: Payer: Self-pay

## 2019-08-05 ENCOUNTER — Other Ambulatory Visit
Admission: RE | Admit: 2019-08-05 | Discharge: 2019-08-05 | Disposition: A | Discharge status: Home / Self Care | Payer: Medicare Other | Source: Ambulatory Visit | Attending: Gastroenterology | Admitting: Gastroenterology

## 2019-08-05 ENCOUNTER — Ambulatory Visit: Payer: Self-pay | Admitting: General Surgery

## 2019-08-05 DIAGNOSIS — I10 Essential (primary) hypertension: Secondary | ICD-10-CM | POA: Diagnosis not present

## 2019-08-05 DIAGNOSIS — K810 Acute cholecystitis: Secondary | ICD-10-CM

## 2019-08-05 DIAGNOSIS — Z96659 Presence of unspecified artificial knee joint: Secondary | ICD-10-CM | POA: Diagnosis not present

## 2019-08-05 DIAGNOSIS — Z20822 Contact with and (suspected) exposure to covid-19: Secondary | ICD-10-CM | POA: Insufficient documentation

## 2019-08-05 DIAGNOSIS — Z79899 Other long term (current) drug therapy: Secondary | ICD-10-CM | POA: Diagnosis not present

## 2019-08-05 DIAGNOSIS — I739 Peripheral vascular disease, unspecified: Secondary | ICD-10-CM | POA: Diagnosis not present

## 2019-08-05 DIAGNOSIS — H918X9 Other specified hearing loss, unspecified ear: Secondary | ICD-10-CM | POA: Diagnosis not present

## 2019-08-05 DIAGNOSIS — K8050 Calculus of bile duct without cholangitis or cholecystitis without obstruction: Secondary | ICD-10-CM | POA: Diagnosis present

## 2019-08-05 DIAGNOSIS — K8070 Calculus of gallbladder and bile duct without cholecystitis without obstruction: Secondary | ICD-10-CM | POA: Insufficient documentation

## 2019-08-05 DIAGNOSIS — Z880 Allergy status to penicillin: Secondary | ICD-10-CM | POA: Diagnosis not present

## 2019-08-05 DIAGNOSIS — Z888 Allergy status to other drugs, medicaments and biological substances status: Secondary | ICD-10-CM | POA: Diagnosis not present

## 2019-08-05 DIAGNOSIS — E785 Hyperlipidemia, unspecified: Secondary | ICD-10-CM | POA: Diagnosis not present

## 2019-08-05 DIAGNOSIS — Z87891 Personal history of nicotine dependence: Secondary | ICD-10-CM | POA: Diagnosis not present

## 2019-08-05 DIAGNOSIS — K8043 Calculus of bile duct with acute cholecystitis with obstruction: Secondary | ICD-10-CM

## 2019-08-05 DIAGNOSIS — Z7901 Long term (current) use of anticoagulants: Secondary | ICD-10-CM | POA: Diagnosis not present

## 2019-08-05 DIAGNOSIS — K8010 Calculus of gallbladder with chronic cholecystitis without obstruction: Secondary | ICD-10-CM | POA: Diagnosis not present

## 2019-08-05 DIAGNOSIS — Z8673 Personal history of transient ischemic attack (TIA), and cerebral infarction without residual deficits: Secondary | ICD-10-CM | POA: Diagnosis not present

## 2019-08-05 DIAGNOSIS — K7689 Other specified diseases of liver: Secondary | ICD-10-CM | POA: Insufficient documentation

## 2019-08-05 DIAGNOSIS — Z01812 Encounter for preprocedural laboratory examination: Secondary | ICD-10-CM | POA: Insufficient documentation

## 2019-08-05 LAB — SARS CORONAVIRUS 2 (TAT 6-24 HRS): SARS Coronavirus 2: NEGATIVE

## 2019-08-05 MED ORDER — GADOBUTROL 1 MMOL/ML IV SOLN
6.0000 mL | Freq: Once | INTRAVENOUS | Status: AC | PRN
  Administered 2019-08-05: 6 mL via INTRAVENOUS

## 2019-08-05 NOTE — Telephone Encounter (Signed)
Spoke to pt's daughter, Laveda Abbe regarding ERCP that has been scheduled for 09/07/19 at [facility]. Instructions have been given to her and pt will go today for covid testing. Confirmed with Lorie pt has stopped Eliquis.

## 2019-08-05 NOTE — H&P (View-Only) (Signed)
PATIENT PROFILE: Ashley Hill is a 85 y.o. female who presents to the Clinic for consultation at the request of Dr. Doy Hutching for evaluation of acute cholecystitis.  PCP:  [name], MD  HISTORY OF PRESENT ILLNESS: Most of the history taken from daughter due to patient baseline dementia.  Daughter reported the patient had pain on her upper abdomen 4 days ago.  The pain radiated to her back.  There was no alleviating or aggravating factor.  She denies fever or chills.  She denies nausea or vomiting.  She went to see her primary care physician.  PCP ordered lab work-up and ultrasound of the abdomen.  The labs shows elevated liver enzymes, elevated alkaline phosphatase and elevated bilirubin.  Ultrasound shows cholelithiasis with normal gallbladder wall thickening.  There was dilated common bile duct.  Patient was referred to GI for evaluation liver enzymes by PCP.  This morning patient was consulted to surgery for acute cholecystitis.   PROBLEM LIST:         Problem List  Date Reviewed: 06/06/2019         Noted   Abnormal glucose 10/17/2018   B12 deficiency 06/14/2018   Seizure disorder (CMS-HCC) 05/22/2017   Hypotension due to drugs 03/08/2016   Atherosclerotic peripheral vascular disease with intermittent claudication (CMS-HCC) 06/30/2015   Overview    Sp bilateral femoral artery stenting 2016      AVNRT (AV nodal re-entry tachycardia) (CMS-HCC) 10/05/2014   Overview    Sp ablation 2012      Bilateral carotid artery stenosis 10/05/2014   Mixed hyperlipidemia 10/05/2014   Benign essential HTN 10/05/2014   Nontraumatic hematoma 06/25/2014   Right knee pain 01/27/2013   Osteoarthritis 10/23/2012      GENERAL REVIEW OF SYSTEMS:   General ROS: negative for - chills, fatigue, fever, weight gain or weight loss Allergy and Immunology ROS: negative for - hives  Hematological and Lymphatic ROS: negative for - bleeding problems or bruising, negative for palpable  nodes Endocrine ROS: negative for - heat or cold intolerance, hair changes Respiratory ROS: negative for - cough, shortness of breath or wheezing Cardiovascular ROS: no chest pain or palpitations GI ROS: negative for nausea, vomiting, diarrhea, constipation.  Positive for 1 episode of abdominal pain Musculoskeletal ROS: negative for - joint swelling or muscle pain Neurological ROS: negative for - confusion, syncope Dermatological ROS: negative for pruritus and rash Psychiatric: negative for anxiety, depression, difficulty sleeping and memory loss  MEDICATIONS: Current Medications        Current Outpatient Medications  Medication Sig Dispense Refill  . alpha lipoic acid 600 mg Cap capsule Take 1 capsule (600 mg total) by mouth once daily.    Marland Kitchen amLODIPine (NORVASC) 5 MG tablet Take 1 tablet (5 mg total) by mouth once daily 90 tablet 3  . apixaban (ELIQUIS) 5 mg tablet Take 1 tablet by mouth 2 (two) times daily.    . BD INTEGRA SYRINGE 3 mL 25 gauge x 5/8" Syrg Use 1 each month 100 Syringe 5  . CALCIUM CARBONATE (CALCIUM 600 ORAL) Take 600 mg by mouth 2 (two) times daily.    . calcium carbonate-simethicone 750-80 mg Chew Take by mouth    . carvedilol (COREG) 25 MG tablet Take 1 tablet (25 mg total) by mouth 2 (two) times daily with meals 180 tablet 3  . cloNIDine HCL (CATAPRES) 0.1 MG tablet TAKE 1 TABLET (0.1 MG TOTAL) BY MOUTH 2 (TWO) TIMES DAILY 180 tablet 1  . co-enzyme Q-10, ubiquinone,  50 mg capsule Take 1 capsule by mouth once daily.    . cyanocobalamin (VITAMIN B12) 1,000 mcg/mL injection Inject 1 mL (1,000 mcg total) into the muscle monthly 3 mL 3  . rosuvastatin (CRESTOR) 10 MG tablet Take 1 tablet (10 mg total) by mouth once daily 90 tablet 3  . turm-ging-bos-yuc-wil-cham-hor 100-100-100-125 mg Tab Take 1 capsule by mouth once daily. Pt is taking Tumeric    . VITAMIN B COMPLEX ORAL Take 1 tablet by mouth daily.    Marland Kitchen donepezil (ARICEPT) 10 MG tablet Take 1 tablet (10  mg total) by mouth nightly for 90 days 90 tablet 3  . triamcinolone 0.5 % cream Apply topically 2 (two) times daily Apply 2 x per day (up to 7-10 days) (Patient not taking: Reported on 06/06/2019  ) 454 g 3            Current Facility-Administered Medications  Medication Dose Route Frequency Provider Last Rate Last Admin  . cyanocobalamin (VITAMIN B12) injection 1,000 mcg  1,000 mcg Intramuscular Q30 Days [name], MD   1,000 mcg at 09/20/17 1414      ALLERGIES: Penicillins and Lipitor [atorvastatin]  PAST MEDICAL HISTORY:     Past Medical History:  Diagnosis Date  . Chicken pox   . Clotting disorder (CMS-HCC)   . Hyperlipidemia   . Hypertension     PAST SURGICAL HISTORY:      Past Surgical History:  Procedure Laterality Date  . ANGIOCATH    . BLEPHAROPLASTY    . CARDIAC ABLATION  2012  . Closed reduction right shoulder with fluoroscopy Right 04/10/2016   Dr.Menz  . endarterectomy  07/2016  . HYSTERECTOMY  06/20/1970  . KNEE ARTHROSCOPY Right 07/18/12  . KNEE ARTHROSCOPY Left 2006  . TONSILLECTOMY       FAMILY HISTORY:      Family History  Problem Relation Age of Onset  . Pancreatic cancer Mother   . Cancer Mother   . Lung cancer Father   . Cancer Father   . Mental retardation Sister   . Diabetes Paternal Grandfather      SOCIAL HISTORY: Social History          Socioeconomic History  . Marital status: Divorced    Spouse name: Not on file  . Number of children: Not on file  . Years of education: Not on file  . Highest education level: Not on file  Occupational History  . Not on file  Social Needs  . Financial resource strain: Not on file  . Food insecurity    Worry: Not on file    Inability: Not on file  . Transportation needs    Medical: Not on file    Non-medical: Not on file  Tobacco Use  . Smoking status: Former Smoker    Years: 3.00    Quit date: 05/15/1980    Years since quitting: 39.2  .  Smokeless tobacco: Never Used  Substance and Sexual Activity  . Alcohol use: No    Alcohol/week: 0.0 standard drinks  . Drug use: No  . Sexual activity: Not Currently    Partners: Male  Other Topics Concern  . Not on file  Social History Narrative   Education: na   Occupation: [employer]   Hobbies: sewing, gardening   Marital Status: divorced      PHYSICAL EXAM:    Vitals:   08/04/19 1338  BP: 179/89  Pulse: 91   Body mass index is 24.3 kg/m. Weight: 66.2  kg (146 lb)   GENERAL: Alert, active, oriented x3  HEENT: Pupils equal reactive to light. Extraocular movements are intact. Sclera clear. Palpebral conjunctiva normal red color.Pharynx clear.  NECK: Supple with no palpable mass and no adenopathy.  LUNGS: Sound clear with no rales rhonchi or wheezes.  HEART: Regular rhythm S1 and S2 without murmur.  ABDOMEN: Soft and depressible, nontender with no palpable mass, no hepatomegaly.   EXTREMITIES: Well-developed well-nourished symmetrical with no dependent edema.  NEUROLOGICAL: Awake alert oriented, facial expression symmetrical, moving all extremities.  REVIEW OF DATA: I have reviewed the following data today:      Appointment on 08/04/2019  Component Date Value  . WBC (White Blood Cell Co* 08/04/2019 6.2   . RBC (Red Blood Cell Coun* 08/04/2019 4.65   . Hemoglobin 08/04/2019 14.6   . Hematocrit 08/04/2019 44.0   . MCV (Mean Corpuscular Vo* 08/04/2019 94.6   . MCH (Mean Corpuscular He* 08/04/2019 31.4*  . MCHC (Mean Corpuscular H* 08/04/2019 33.2   . Platelet Count 08/04/2019 182   . RDW-CV (Red Cell Distrib* 08/04/2019 13.0   . MPV (Mean Platelet Volum* 08/04/2019 9.3*  . Neutrophils 08/04/2019 3.41   . Lymphocytes 08/04/2019 1.73   . Monocytes 08/04/2019 0.52   . Eosinophils 08/04/2019 0.43   . Basophils 08/04/2019 0.06   . Neutrophil % 08/04/2019 55.3   . Lymphocyte % 08/04/2019 28.0   . Monocyte % 08/04/2019 8.4   . Eosinophil %  08/04/2019 7.0*  . Basophil% 08/04/2019 1.0   . Immature Granulocyte % 08/04/2019 0.3   . Immature Granulocyte Cou* 08/04/2019 0.02   . Glucose 08/04/2019 111*  . Sodium 08/04/2019 143   . Potassium 08/04/2019 3.9   . Chloride 08/04/2019 105   . Carbon Dioxide (CO2) 08/04/2019 29.5   . Urea Nitrogen (BUN) 08/04/2019 13   . Creatinine 08/04/2019 0.7   . Glomerular Filtration Ra* 08/04/2019 80   . Calcium 08/04/2019 9.5   . AST  08/04/2019 85*  . ALT  08/04/2019 217*  . Alk Phos (alkaline Phosp* 08/04/2019 186*  . Albumin 08/04/2019 4.3   . Bilirubin, Total 08/04/2019 1.4*  . Protein, Total 08/04/2019 6.9   . A/G Ratio 08/04/2019 1.7   . Bilirubin, Conjugated 08/04/2019 0.22*  Office Visit on 08/01/2019  Component Date Value  . WBC (White Blood Cell Co* 08/01/2019 5.3   . RBC (Red Blood Cell Coun* 08/01/2019 4.58   . Hemoglobin 08/01/2019 14.5   . Hematocrit 08/01/2019 43.5   . MCV (Mean Corpuscular Vo* 08/01/2019 95.0   . MCH (Mean Corpuscular He* 08/01/2019 31.7*  . MCHC (Mean Corpuscular H* 08/01/2019 33.3   . Platelet Count 08/01/2019 173   . RDW-CV (Red Cell Distrib* 08/01/2019 12.5   . MPV (Mean Platelet Volum* 08/01/2019 9.4   . Neutrophils 08/01/2019 3.55   . Lymphocytes 08/01/2019 1.01   . Monocytes 08/01/2019 0.46   . Eosinophils 08/01/2019 0.24   . Basophils 08/01/2019 0.04   . Neutrophil % 08/01/2019 66.8   . Lymphocyte % 08/01/2019 19.0   . Monocyte % 08/01/2019 8.7   . Eosinophil % 08/01/2019 4.5   . Basophil% 08/01/2019 0.8   . Immature Granulocyte % 08/01/2019 0.2   . Immature Granulocyte Cou* 08/01/2019 0.01   . Glucose 08/01/2019 111*  . Sodium 08/01/2019 140   . Potassium 08/01/2019 3.7   . Chloride 08/01/2019 104   . Carbon Dioxide (CO2) 08/01/2019 29.9   . Urea Nitrogen (BUN) 08/01/2019 13   .  Creatinine 08/01/2019 0.7   . Glomerular Filtration Ra* 08/01/2019 80   . Calcium 08/01/2019 9.3   . AST  08/01/2019 790*  . ALT  08/01/2019 419*  .  Alk Phos (alkaline Phosp* 08/01/2019 163*  . Albumin 08/01/2019 4.4   . Bilirubin, Total 08/01/2019 2.9*  . Protein, Total 08/01/2019 7.1   . A/G Ratio 08/01/2019 1.6   Office Visit on 06/06/2019  Component Date Value  . Glucose 06/06/2019 113*  . Sodium 06/06/2019 139   . Potassium 06/06/2019 4.1   . Chloride 06/06/2019 102   . Carbon Dioxide (CO2) 06/06/2019 28.2   . Urea Nitrogen (BUN) 06/06/2019 11   . Creatinine 06/06/2019 0.8   . Glomerular Filtration Ra* 06/06/2019 69   . Calcium 06/06/2019 10.1   . AST  06/06/2019 18   . ALT  06/06/2019 13   . Alk Phos (alkaline Phosp* 06/06/2019 78   . Albumin 06/06/2019 4.4   . Bilirubin, Total 06/06/2019 0.8   . Protein, Total 06/06/2019 6.9   . A/G Ratio 06/06/2019 1.8   . WBC (White Blood Cell Co* 06/06/2019 5.8   . RBC (Red Blood Cell Coun* 06/06/2019 4.82   . Hemoglobin 06/06/2019 15.1*  . Hematocrit 06/06/2019 45.5   . MCV (Mean Corpuscular Vo* 06/06/2019 94.4   . MCH (Mean Corpuscular He* 06/06/2019 31.3*  . MCHC (Mean Corpuscular H* 06/06/2019 33.2   . Platelet Count 06/06/2019 226   . RDW-CV (Red Cell Distrib* 06/06/2019 12.0   . MPV (Mean Platelet Volum* 06/06/2019 9.6   . Neutrophils 06/06/2019 3.30   . Lymphocytes 06/06/2019 1.79   . Monocytes 06/06/2019 0.51   . Eosinophils 06/06/2019 0.16   . Basophils 06/06/2019 0.03   . Neutrophil % 06/06/2019 56.8   . Lymphocyte % 06/06/2019 30.9   . Monocyte % 06/06/2019 8.8   . Eosinophil % 06/06/2019 2.8   . Basophil% 06/06/2019 0.5   . Immature Granulocyte % 06/06/2019 0.2   . Immature Granulocyte Cou* 06/06/2019 0.01   . Cholesterol, Total 06/06/2019 331*  . Triglyceride 06/06/2019 136   . HDL (High Density Lipopr* 06/06/2019 59.6   . LDL Calculated 06/06/2019 244*  . VLDL Cholesterol 06/06/2019 27   . Cholesterol/HDL Ratio 06/06/2019 5.6   . Hemoglobin A1C 06/06/2019 5.7*  . Average Blood Glucose (C* 06/06/2019 117   . Vitamin B12 06/06/2019 697   . Color  06/06/2019 Yellow   . Clarity 06/06/2019 Clear   . Specific Gravity 06/06/2019 1.025   . pH, Urine 06/06/2019 6.0   . Protein, Urinalysis 06/06/2019 30 *  . Glucose, Urinalysis 06/06/2019 Negative   . Ketones, Urinalysis 06/06/2019 Negative   . Blood, Urinalysis 06/06/2019 Negative   . Nitrite, Urinalysis 06/06/2019 Negative   . Leukocyte Esterase, Urin* 06/06/2019 Negative   . White Blood Cells, Urina* 06/06/2019 None Seen   . Red Blood Cells, Urinaly* 06/06/2019 None Seen   . Bacteria, Urinalysis 06/06/2019 Rare*  . Squamous Epithelial Cell* 06/06/2019 Moderate*  . Thyroid Stimulating Horm* 06/06/2019 3.821      ASSESSMENT: Ms. Ottaway is a 85 y.o. female presenting for consultation for acute cholecystitis.    Patient without clinical or images suggestive of acute cholecystitis.  Patient has a history of one acute episode of abdominal pain.  I personally evaluated the ultrasound images and there is no gallbladder wall thickening or pericholecystic fluid.  I was able to identify the dilated common bile duct.  I also evaluated the labs that showed elevated liver enzymes  with alkaline phosphatase and bilirubin.  All these findings reported together are concerning for episode of choledocholithiasis.  I urgently called the daughter this morning and she reported the patient was feeling better during the weekend.  Due to my low suspicion of choledocholithiasis I coordinated MRCP to rule out choledocholithiasis.  Despite putting a stat order, radiology department set the patient for tomorrow morning.  I ordered new labs this morning to see the trend of the liver enzymes, alkaline phosphatase and bilirubin.  The liver enzymes and bilirubin has significantly decreased.  Patient and her daughter where oriented about the diagnosis of choledocholithiasis.  Since the patient has improved labs and she is asymptomatic at this moment I think that is reasonable to proceed with MRCP tomorrow morning and no need for  admission at this moment.  I oriented the daughter that if the patient developed severe abdominal pain, nausea, vomiting or fever she will need to go to the emergency department for further evaluation.  Also oriented about what is the gallbladder, its anatomy and function and the implications of having stones. The patient was oriented about the treatment alternatives (observation vs cholecystectomy).  Surgical technique (open vs laparoscopic) was discussed. It was also discussed the goals of the surgery (decrease the pain episodes and avoid the risk of cholecystitis and recurrent choledocholithiasis) and the risk of surgery including: bleeding, infection, common bile duct injury, stone retention, injury to other organs such as bowel, liver, stomach, other complications such as hernia, bowel obstruction among others. Also discussed with patient about anesthesia and its complications such as: reaction to medications, pneumonia, heart complications, death, among others.  Patient had MRCP that confirmed the stone in the distal common bile duct. Dr. Allen Norris contacted and will perform ERCP. Will coordinate cholecystectomy. Patient's daughter notified and agreed.   Choledocholithiasis [K80.50]  PLAN: 1. Robotic assisted laparoscopic cholecystectomy.    Patient and her daughter verbalized understanding, all questions were answered, and were agreeable with the plan outlined above.   I spent a total of 70 minutes in both face-to-face and non-face-to-face activities for this visit on the date of this encounter.   Herbert Pun, MD  Electronically signed by Herbert Pun, MD

## 2019-08-05 NOTE — Telephone Encounter (Signed)
-----   Message from Midge Minium, MD sent at 08/05/2019 10:14 AM EDT ----- This patient needs an ERCP this Thursday. Obstructing stone. On Eliquis and was told to stop today.

## 2019-08-05 NOTE — H&P (Signed)
PATIENT PROFILE: Ashley Hill is a 85 y.o. female who presents to the Clinic for consultation at the request of Dr. Doy Hutching for evaluation of acute cholecystitis.  PCP:  [name], MD  HISTORY OF PRESENT ILLNESS: Most of the history taken from daughter due to patient baseline dementia.  Daughter reported the patient had pain on her upper abdomen 4 days ago.  The pain radiated to her back.  There was no alleviating or aggravating factor.  She denies fever or chills.  She denies nausea or vomiting.  She went to see her primary care physician.  PCP ordered lab work-up and ultrasound of the abdomen.  The labs shows elevated liver enzymes, elevated alkaline phosphatase and elevated bilirubin.  Ultrasound shows cholelithiasis with normal gallbladder wall thickening.  There was dilated common bile duct.  Patient was referred to GI for evaluation liver enzymes by PCP.  This morning patient was consulted to surgery for acute cholecystitis.   PROBLEM LIST:         Problem List  Date Reviewed: 06/06/2019         Noted   Abnormal glucose 10/17/2018   B12 deficiency 06/14/2018   Seizure disorder (CMS-HCC) 05/22/2017   Hypotension due to drugs 03/08/2016   Atherosclerotic peripheral vascular disease with intermittent claudication (CMS-HCC) 06/30/2015   Overview    Sp bilateral femoral artery stenting 2016      AVNRT (AV nodal re-entry tachycardia) (CMS-HCC) 10/05/2014   Overview    Sp ablation 2012      Bilateral carotid artery stenosis 10/05/2014   Mixed hyperlipidemia 10/05/2014   Benign essential HTN 10/05/2014   Nontraumatic hematoma 06/25/2014   Right knee pain 01/27/2013   Osteoarthritis 10/23/2012      GENERAL REVIEW OF SYSTEMS:   General ROS: negative for - chills, fatigue, fever, weight gain or weight loss Allergy and Immunology ROS: negative for - hives  Hematological and Lymphatic ROS: negative for - bleeding problems or bruising, negative for palpable  nodes Endocrine ROS: negative for - heat or cold intolerance, hair changes Respiratory ROS: negative for - cough, shortness of breath or wheezing Cardiovascular ROS: no chest pain or palpitations GI ROS: negative for nausea, vomiting, diarrhea, constipation.  Positive for 1 episode of abdominal pain Musculoskeletal ROS: negative for - joint swelling or muscle pain Neurological ROS: negative for - confusion, syncope Dermatological ROS: negative for pruritus and rash Psychiatric: negative for anxiety, depression, difficulty sleeping and memory loss  MEDICATIONS: Current Medications        Current Outpatient Medications  Medication Sig Dispense Refill  . alpha lipoic acid 600 mg Cap capsule Take 1 capsule (600 mg total) by mouth once daily.    Marland Kitchen amLODIPine (NORVASC) 5 MG tablet Take 1 tablet (5 mg total) by mouth once daily 90 tablet 3  . apixaban (ELIQUIS) 5 mg tablet Take 1 tablet by mouth 2 (two) times daily.    . BD INTEGRA SYRINGE 3 mL 25 gauge x 5/8" Syrg Use 1 each month 100 Syringe 5  . CALCIUM CARBONATE (CALCIUM 600 ORAL) Take 600 mg by mouth 2 (two) times daily.    . calcium carbonate-simethicone 750-80 mg Chew Take by mouth    . carvedilol (COREG) 25 MG tablet Take 1 tablet (25 mg total) by mouth 2 (two) times daily with meals 180 tablet 3  . cloNIDine HCL (CATAPRES) 0.1 MG tablet TAKE 1 TABLET (0.1 MG TOTAL) BY MOUTH 2 (TWO) TIMES DAILY 180 tablet 1  . co-enzyme Q-10, ubiquinone,  50 mg capsule Take 1 capsule by mouth once daily.    . cyanocobalamin (VITAMIN B12) 1,000 mcg/mL injection Inject 1 mL (1,000 mcg total) into the muscle monthly 3 mL 3  . rosuvastatin (CRESTOR) 10 MG tablet Take 1 tablet (10 mg total) by mouth once daily 90 tablet 3  . turm-ging-bos-yuc-wil-cham-hor 100-100-100-125 mg Tab Take 1 capsule by mouth once daily. Pt is taking Tumeric    . VITAMIN B COMPLEX ORAL Take 1 tablet by mouth daily.    Marland Kitchen donepezil (ARICEPT) 10 MG tablet Take 1 tablet (10  mg total) by mouth nightly for 90 days 90 tablet 3  . triamcinolone 0.5 % cream Apply topically 2 (two) times daily Apply 2 x per day (up to 7-10 days) (Patient not taking: Reported on 06/06/2019  ) 454 g 3            Current Facility-Administered Medications  Medication Dose Route Frequency Provider Last Rate Last Admin  . cyanocobalamin (VITAMIN B12) injection 1,000 mcg  1,000 mcg Intramuscular Q30 Days [name], MD   1,000 mcg at 09/20/17 1414      ALLERGIES: Penicillins and Lipitor [atorvastatin]  PAST MEDICAL HISTORY:     Past Medical History:  Diagnosis Date  . Chicken pox   . Clotting disorder (CMS-HCC)   . Hyperlipidemia   . Hypertension     PAST SURGICAL HISTORY:      Past Surgical History:  Procedure Laterality Date  . ANGIOCATH    . BLEPHAROPLASTY    . CARDIAC ABLATION  2012  . Closed reduction right shoulder with fluoroscopy Right 04/10/2016   Dr.Menz  . endarterectomy  07/2016  . HYSTERECTOMY  06/20/1970  . KNEE ARTHROSCOPY Right 07/18/12  . KNEE ARTHROSCOPY Left 2006  . TONSILLECTOMY       FAMILY HISTORY:      Family History  Problem Relation Age of Onset  . Pancreatic cancer Mother   . Cancer Mother   . Lung cancer Father   . Cancer Father   . Mental retardation Sister   . Diabetes Paternal Grandfather      SOCIAL HISTORY: Social History          Socioeconomic History  . Marital status: Divorced    Spouse name: Not on file  . Number of children: Not on file  . Years of education: Not on file  . Highest education level: Not on file  Occupational History  . Not on file  Social Needs  . Financial resource strain: Not on file  . Food insecurity    Worry: Not on file    Inability: Not on file  . Transportation needs    Medical: Not on file    Non-medical: Not on file  Tobacco Use  . Smoking status: Former Smoker    Years: 3.00    Quit date: 05/15/1980    Years since quitting: 39.2  .  Smokeless tobacco: Never Used  Substance and Sexual Activity  . Alcohol use: No    Alcohol/week: 0.0 standard drinks  . Drug use: No  . Sexual activity: Not Currently    Partners: Male  Other Topics Concern  . Not on file  Social History Narrative   Education: na   Occupation: [employer]   Hobbies: sewing, gardening   Marital Status: divorced      PHYSICAL EXAM:    Vitals:   08/04/19 1338  BP: 179/89  Pulse: 91   Body mass index is 24.3 kg/m. Weight: 66.2  kg (146 lb)   GENERAL: Alert, active, oriented x3  HEENT: Pupils equal reactive to light. Extraocular movements are intact. Sclera clear. Palpebral conjunctiva normal red color.Pharynx clear.  NECK: Supple with no palpable mass and no adenopathy.  LUNGS: Sound clear with no rales rhonchi or wheezes.  HEART: Regular rhythm S1 and S2 without murmur.  ABDOMEN: Soft and depressible, nontender with no palpable mass, no hepatomegaly.   EXTREMITIES: Well-developed well-nourished symmetrical with no dependent edema.  NEUROLOGICAL: Awake alert oriented, facial expression symmetrical, moving all extremities.  REVIEW OF DATA: I have reviewed the following data today:      Appointment on 08/04/2019  Component Date Value  . WBC (White Blood Cell Co* 08/04/2019 6.2   . RBC (Red Blood Cell Coun* 08/04/2019 4.65   . Hemoglobin 08/04/2019 14.6   . Hematocrit 08/04/2019 44.0   . MCV (Mean Corpuscular Vo* 08/04/2019 94.6   . MCH (Mean Corpuscular He* 08/04/2019 31.4*  . MCHC (Mean Corpuscular H* 08/04/2019 33.2   . Platelet Count 08/04/2019 182   . RDW-CV (Red Cell Distrib* 08/04/2019 13.0   . MPV (Mean Platelet Volum* 08/04/2019 9.3*  . Neutrophils 08/04/2019 3.41   . Lymphocytes 08/04/2019 1.73   . Monocytes 08/04/2019 0.52   . Eosinophils 08/04/2019 0.43   . Basophils 08/04/2019 0.06   . Neutrophil % 08/04/2019 55.3   . Lymphocyte % 08/04/2019 28.0   . Monocyte % 08/04/2019 8.4   . Eosinophil %  08/04/2019 7.0*  . Basophil% 08/04/2019 1.0   . Immature Granulocyte % 08/04/2019 0.3   . Immature Granulocyte Cou* 08/04/2019 0.02   . Glucose 08/04/2019 111*  . Sodium 08/04/2019 143   . Potassium 08/04/2019 3.9   . Chloride 08/04/2019 105   . Carbon Dioxide (CO2) 08/04/2019 29.5   . Urea Nitrogen (BUN) 08/04/2019 13   . Creatinine 08/04/2019 0.7   . Glomerular Filtration Ra* 08/04/2019 80   . Calcium 08/04/2019 9.5   . AST  08/04/2019 85*  . ALT  08/04/2019 217*  . Alk Phos (alkaline Phosp* 08/04/2019 186*  . Albumin 08/04/2019 4.3   . Bilirubin, Total 08/04/2019 1.4*  . Protein, Total 08/04/2019 6.9   . A/G Ratio 08/04/2019 1.7   . Bilirubin, Conjugated 08/04/2019 0.22*  Office Visit on 08/01/2019  Component Date Value  . WBC (White Blood Cell Co* 08/01/2019 5.3   . RBC (Red Blood Cell Coun* 08/01/2019 4.58   . Hemoglobin 08/01/2019 14.5   . Hematocrit 08/01/2019 43.5   . MCV (Mean Corpuscular Vo* 08/01/2019 95.0   . MCH (Mean Corpuscular He* 08/01/2019 31.7*  . MCHC (Mean Corpuscular H* 08/01/2019 33.3   . Platelet Count 08/01/2019 173   . RDW-CV (Red Cell Distrib* 08/01/2019 12.5   . MPV (Mean Platelet Volum* 08/01/2019 9.4   . Neutrophils 08/01/2019 3.55   . Lymphocytes 08/01/2019 1.01   . Monocytes 08/01/2019 0.46   . Eosinophils 08/01/2019 0.24   . Basophils 08/01/2019 0.04   . Neutrophil % 08/01/2019 66.8   . Lymphocyte % 08/01/2019 19.0   . Monocyte % 08/01/2019 8.7   . Eosinophil % 08/01/2019 4.5   . Basophil% 08/01/2019 0.8   . Immature Granulocyte % 08/01/2019 0.2   . Immature Granulocyte Cou* 08/01/2019 0.01   . Glucose 08/01/2019 111*  . Sodium 08/01/2019 140   . Potassium 08/01/2019 3.7   . Chloride 08/01/2019 104   . Carbon Dioxide (CO2) 08/01/2019 29.9   . Urea Nitrogen (BUN) 08/01/2019 13   .  Creatinine 08/01/2019 0.7   . Glomerular Filtration Ra* 08/01/2019 80   . Calcium 08/01/2019 9.3   . AST  08/01/2019 790*  . ALT  08/01/2019 419*  .  Alk Phos (alkaline Phosp* 08/01/2019 163*  . Albumin 08/01/2019 4.4   . Bilirubin, Total 08/01/2019 2.9*  . Protein, Total 08/01/2019 7.1   . A/G Ratio 08/01/2019 1.6   Office Visit on 06/06/2019  Component Date Value  . Glucose 06/06/2019 113*  . Sodium 06/06/2019 139   . Potassium 06/06/2019 4.1   . Chloride 06/06/2019 102   . Carbon Dioxide (CO2) 06/06/2019 28.2   . Urea Nitrogen (BUN) 06/06/2019 11   . Creatinine 06/06/2019 0.8   . Glomerular Filtration Ra* 06/06/2019 69   . Calcium 06/06/2019 10.1   . AST  06/06/2019 18   . ALT  06/06/2019 13   . Alk Phos (alkaline Phosp* 06/06/2019 78   . Albumin 06/06/2019 4.4   . Bilirubin, Total 06/06/2019 0.8   . Protein, Total 06/06/2019 6.9   . A/G Ratio 06/06/2019 1.8   . WBC (White Blood Cell Co* 06/06/2019 5.8   . RBC (Red Blood Cell Coun* 06/06/2019 4.82   . Hemoglobin 06/06/2019 15.1*  . Hematocrit 06/06/2019 45.5   . MCV (Mean Corpuscular Vo* 06/06/2019 94.4   . MCH (Mean Corpuscular He* 06/06/2019 31.3*  . MCHC (Mean Corpuscular H* 06/06/2019 33.2   . Platelet Count 06/06/2019 226   . RDW-CV (Red Cell Distrib* 06/06/2019 12.0   . MPV (Mean Platelet Volum* 06/06/2019 9.6   . Neutrophils 06/06/2019 3.30   . Lymphocytes 06/06/2019 1.79   . Monocytes 06/06/2019 0.51   . Eosinophils 06/06/2019 0.16   . Basophils 06/06/2019 0.03   . Neutrophil % 06/06/2019 56.8   . Lymphocyte % 06/06/2019 30.9   . Monocyte % 06/06/2019 8.8   . Eosinophil % 06/06/2019 2.8   . Basophil% 06/06/2019 0.5   . Immature Granulocyte % 06/06/2019 0.2   . Immature Granulocyte Cou* 06/06/2019 0.01   . Cholesterol, Total 06/06/2019 331*  . Triglyceride 06/06/2019 136   . HDL (High Density Lipopr* 06/06/2019 59.6   . LDL Calculated 06/06/2019 244*  . VLDL Cholesterol 06/06/2019 27   . Cholesterol/HDL Ratio 06/06/2019 5.6   . Hemoglobin A1C 06/06/2019 5.7*  . Average Blood Glucose (C* 06/06/2019 117   . Vitamin B12 06/06/2019 697   . Color  06/06/2019 Yellow   . Clarity 06/06/2019 Clear   . Specific Gravity 06/06/2019 1.025   . pH, Urine 06/06/2019 6.0   . Protein, Urinalysis 06/06/2019 30 *  . Glucose, Urinalysis 06/06/2019 Negative   . Ketones, Urinalysis 06/06/2019 Negative   . Blood, Urinalysis 06/06/2019 Negative   . Nitrite, Urinalysis 06/06/2019 Negative   . Leukocyte Esterase, Urin* 06/06/2019 Negative   . White Blood Cells, Urina* 06/06/2019 None Seen   . Red Blood Cells, Urinaly* 06/06/2019 None Seen   . Bacteria, Urinalysis 06/06/2019 Rare*  . Squamous Epithelial Cell* 06/06/2019 Moderate*  . Thyroid Stimulating Horm* 06/06/2019 3.821      ASSESSMENT: Ms. Slinker is a 85 y.o. female presenting for consultation for acute cholecystitis.    Patient without clinical or images suggestive of acute cholecystitis.  Patient has a history of one acute episode of abdominal pain.  I personally evaluated the ultrasound images and there is no gallbladder wall thickening or pericholecystic fluid.  I was able to identify the dilated common bile duct.  I also evaluated the labs that showed elevated liver enzymes  with alkaline phosphatase and bilirubin.  All these findings reported together are concerning for episode of choledocholithiasis.  I urgently called the daughter this morning and she reported the patient was feeling better during the weekend.  Due to my low suspicion of choledocholithiasis I coordinated MRCP to rule out choledocholithiasis.  Despite putting a stat order, radiology department set the patient for tomorrow morning.  I ordered new labs this morning to see the trend of the liver enzymes, alkaline phosphatase and bilirubin.  The liver enzymes and bilirubin has significantly decreased.  Patient and her daughter where oriented about the diagnosis of choledocholithiasis.  Since the patient has improved labs and she is asymptomatic at this moment I think that is reasonable to proceed with MRCP tomorrow morning and no need for  admission at this moment.  I oriented the daughter that if the patient developed severe abdominal pain, nausea, vomiting or fever she will need to go to the emergency department for further evaluation.  Also oriented about what is the gallbladder, its anatomy and function and the implications of having stones. The patient was oriented about the treatment alternatives (observation vs cholecystectomy).  Surgical technique (open vs laparoscopic) was discussed. It was also discussed the goals of the surgery (decrease the pain episodes and avoid the risk of cholecystitis and recurrent choledocholithiasis) and the risk of surgery including: bleeding, infection, common bile duct injury, stone retention, injury to other organs such as bowel, liver, stomach, other complications such as hernia, bowel obstruction among others. Also discussed with patient about anesthesia and its complications such as: reaction to medications, pneumonia, heart complications, death, among others.  Patient had MRCP that confirmed the stone in the distal common bile duct. Dr. Allen Norris contacted and will perform ERCP. Will coordinate cholecystectomy. Patient's daughter notified and agreed.   Choledocholithiasis [K80.50]  PLAN: 1. Robotic assisted laparoscopic cholecystectomy.    Patient and her daughter verbalized understanding, all questions were answered, and were agreeable with the plan outlined above.   I spent a total of 70 minutes in both face-to-face and non-face-to-face activities for this visit on the date of this encounter.   Herbert Pun, MD  Electronically signed by Herbert Pun, MD

## 2019-08-07 ENCOUNTER — Encounter: Payer: Self-pay | Admitting: Gastroenterology

## 2019-08-07 ENCOUNTER — Ambulatory Visit: Payer: Medicare Other | Admitting: Certified Registered Nurse Anesthetist

## 2019-08-07 ENCOUNTER — Encounter
Admission: RE | Disposition: A | Discharge status: Home / Self Care | Payer: Self-pay | Source: Ambulatory Visit | Attending: Gastroenterology

## 2019-08-07 ENCOUNTER — Other Ambulatory Visit: Payer: Self-pay

## 2019-08-07 ENCOUNTER — Ambulatory Visit: Payer: Medicare Other

## 2019-08-07 ENCOUNTER — Ambulatory Visit
Admission: RE | Admit: 2019-08-07 | Discharge: 2019-08-07 | Disposition: A | Discharge status: Home / Self Care | Payer: Medicare Other | Source: Ambulatory Visit | Attending: Gastroenterology | Admitting: Gastroenterology

## 2019-08-07 DIAGNOSIS — Z87891 Personal history of nicotine dependence: Secondary | ICD-10-CM | POA: Insufficient documentation

## 2019-08-07 DIAGNOSIS — Z888 Allergy status to other drugs, medicaments and biological substances status: Secondary | ICD-10-CM | POA: Insufficient documentation

## 2019-08-07 DIAGNOSIS — K8043 Calculus of bile duct with acute cholecystitis with obstruction: Secondary | ICD-10-CM

## 2019-08-07 DIAGNOSIS — I739 Peripheral vascular disease, unspecified: Secondary | ICD-10-CM | POA: Insufficient documentation

## 2019-08-07 DIAGNOSIS — E785 Hyperlipidemia, unspecified: Secondary | ICD-10-CM | POA: Insufficient documentation

## 2019-08-07 DIAGNOSIS — H918X9 Other specified hearing loss, unspecified ear: Secondary | ICD-10-CM | POA: Insufficient documentation

## 2019-08-07 DIAGNOSIS — K8050 Calculus of bile duct without cholangitis or cholecystitis without obstruction: Secondary | ICD-10-CM | POA: Diagnosis not present

## 2019-08-07 DIAGNOSIS — I10 Essential (primary) hypertension: Secondary | ICD-10-CM | POA: Insufficient documentation

## 2019-08-07 DIAGNOSIS — K8010 Calculus of gallbladder with chronic cholecystitis without obstruction: Secondary | ICD-10-CM | POA: Insufficient documentation

## 2019-08-07 DIAGNOSIS — Z20822 Contact with and (suspected) exposure to covid-19: Secondary | ICD-10-CM | POA: Insufficient documentation

## 2019-08-07 DIAGNOSIS — Z79899 Other long term (current) drug therapy: Secondary | ICD-10-CM | POA: Insufficient documentation

## 2019-08-07 DIAGNOSIS — Z880 Allergy status to penicillin: Secondary | ICD-10-CM | POA: Insufficient documentation

## 2019-08-07 DIAGNOSIS — Z8673 Personal history of transient ischemic attack (TIA), and cerebral infarction without residual deficits: Secondary | ICD-10-CM | POA: Insufficient documentation

## 2019-08-07 DIAGNOSIS — Z7901 Long term (current) use of anticoagulants: Secondary | ICD-10-CM | POA: Insufficient documentation

## 2019-08-07 DIAGNOSIS — Z96659 Presence of unspecified artificial knee joint: Secondary | ICD-10-CM | POA: Insufficient documentation

## 2019-08-07 HISTORY — PX: ERCP: SHX5425

## 2019-08-07 SURGERY — ERCP, WITH INTERVENTION IF INDICATED
Anesthesia: General

## 2019-08-07 MED ORDER — INDOMETHACIN 50 MG RE SUPP
RECTAL | Status: DC | PRN
  Administered 2019-08-07: 100 mg via RECTAL

## 2019-08-07 MED ORDER — FENTANYL CITRATE (PF) 100 MCG/2ML IJ SOLN
INTRAMUSCULAR | Status: AC
  Filled 2019-08-07: qty 2

## 2019-08-07 MED ORDER — FENTANYL CITRATE (PF) 100 MCG/2ML IJ SOLN
INTRAMUSCULAR | Status: DC | PRN
  Administered 2019-08-07: 25 ug via INTRAVENOUS

## 2019-08-07 MED ORDER — LIDOCAINE HCL (CARDIAC) PF 100 MG/5ML IV SOSY
PREFILLED_SYRINGE | INTRAVENOUS | Status: DC | PRN
  Administered 2019-08-07: 100 mg via INTRAVENOUS

## 2019-08-07 MED ORDER — SODIUM CHLORIDE 0.9 % IV SOLN: INTRAVENOUS | Status: DC

## 2019-08-07 MED ORDER — PROPOFOL 500 MG/50ML IV EMUL
INTRAVENOUS | Status: DC | PRN
  Administered 2019-08-07: 80 ug/kg/min via INTRAVENOUS

## 2019-08-07 MED ORDER — INDOMETHACIN 50 MG RE SUPP
RECTAL | Status: AC
  Filled 2019-08-07: qty 2

## 2019-08-07 MED ORDER — LACTATED RINGERS IV SOLN
INTRAVENOUS | Status: DC
  Administered 2019-08-07: 1000 mL via INTRAVENOUS

## 2019-08-07 MED ORDER — PROPOFOL 10 MG/ML IV BOLUS
INTRAVENOUS | Status: DC | PRN
  Administered 2019-08-07: 80 mg via INTRAVENOUS
  Administered 2019-08-07: 30 mg via INTRAVENOUS

## 2019-08-07 NOTE — Op Note (Signed)
[facility] Gastroenterology Patient Name: Ashley Hill Procedure Date: 08/07/2019 2:27 PM MRN: [id number] Account #: [id number] Date of Birth: 03-Dec-1934 Admit Type: Outpatient Age: 85 Room: [facility] ENDO ROOM 4 Gender: Female Note Status: Finalized Procedure:             ERCP Indications:           Common bile duct stone(s) Providers:             [name] MD, MD Referring MD:          Leonie Douglas. Doy Hutching, MD (Referring MD) Medicines:             Propofol per Anesthesia Complications:         No immediate complications. Procedure:             Pre-Anesthesia Assessment:                        - Prior to the procedure, a History and Physical was                         performed, and patient medications and allergies were                         reviewed. The patient's tolerance of previous                         anesthesia was also reviewed. The risks and benefits                         of the procedure and the sedation options and risks                         were discussed with the patient. All questions were                         answered, and informed consent was obtained. Prior                         Anticoagulants: The patient has taken Eliquis                         (apixaban), last dose was 3 days prior to procedure.                         ASA Grade Assessment: II - A patient with mild                         systemic disease. After reviewing the risks and                         benefits, the patient was deemed in satisfactory                         condition to undergo the procedure.                        After obtaining informed consent, the scope was passed  under direct vision. Throughout the procedure, the                         patient's blood pressure, pulse, and oxygen                         saturations were monitored continuously. The was                         introduced through the mouth, and used to inject                    contrast into and used to inject contrast into the                         bile duct. The ERCP was accomplished without                         difficulty. The patient tolerated the procedure well. Findings:      The scout film was normal. The esophagus was successfully intubated       under direct vision. The scope was advanced to a normal major papilla in       the descending duodenum without detailed examination of the pharynx,       larynx and associated structures, and upper GI tract. The upper GI tract       was grossly normal. The bile duct was deeply cannulated with the       short-nosed traction sphincterotome. Contrast was injected. I personally       interpreted the bile duct images. There was brisk flow of contrast       through the ducts. Image quality was excellent. Contrast extended to the       entire biliary tree. The lower third of the main bile duct contained one       stone. A wire was passed into the biliary tree. A 7 mm biliary       sphincterotomy was made with a traction (standard) sphincterotome using       ERBE electrocautery. There was no post-sphincterotomy bleeding. The       biliary tree was swept with a 15 mm balloon starting at the bifurcation.       One stone was removed. No stones remained. Impression:            - Choledocholithiasis was found. Complete removal was                         accomplished by biliary sphincterotomy and balloon                         extraction.                        - A biliary sphincterotomy was performed.                        - The biliary tree was swept. Recommendation:        - Discharge patient to home.                        - Clear liquid diet today. Procedure Code(s):     ---  Professional ---                        [phone], Endoscopic retrograde cholangiopancreatography                         (ERCP); with removal of calculi/debris from                         biliary/pancreatic duct(s)                         43262, Endoscopic retrograde cholangiopancreatography                         (ERCP); with sphincterotomy/papillotomy                        [phone], Endoscopic catheterization of the biliary                         ductal system, radiological supervision and                         interpretation Diagnosis Code(s):     --- Professional ---                        K80.50, Calculus of bile duct without cholangitis or                         cholecystitis without obstruction CPT copyright 2019 [employer]. All rights reserved. The codes documented in this report are preliminary and upon coder review may  be revised to meet current compliance requirements. Midge Minium MD, MD 08/07/2019 3:28:56 PM This report has been signed electronically. Number of Addenda: 0 Note Initiated On: 08/07/2019 2:27 PM Estimated Blood Loss:  Estimated blood loss: none.      [facility]

## 2019-08-07 NOTE — H&P (Signed)
Ashley Lame, MD [address] [place], [place] [address] [phone] Fax : [phone]  Primary Care Physician:  [name], MD Primary Gastroenterologist:  Dr. Allen Norris  Pre-Procedure History & Physical: HPI:  Ashley Hill is a 85 y.o. female is here for an ERCP.   Past Medical History:  Diagnosis Date  . Dyslipidemia   . Dysrhythmia   . Ectopic atrial tachycardia ([place])   . Environmental allergies   . H/O scarlet fever   . Heart murmur    IN PAST  . HOH (hard of hearing)   . Hyperlipidemia   . Hypertension   . Peripheral vascular disease ([place])   . 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 Arnold Long, [place].  . Transient cerebral ischemia     Past Surgical History:  Procedure Laterality Date  . ABLATION OF DYSRHYTHMIC FOCUS    . AUGMENTATION MAMMAPLASTY Bilateral   . CATARACT EXTRACTION W/PHACO Left 08/28/2017   Procedure: CATARACT EXTRACTION PHACO AND INTRAOCULAR LENS PLACEMENT (IOC);  Surgeon: Birder Robson, MD;  Location: [facility];  Service: Ophthalmology;  Laterality: Left;  [place] 00:26 AP% 18.4 CDE 4.85 Fluid pa ck lot # [id number]  . CORONARY ANGIOPLASTY    . ENDARTERECTOMY FEMORAL Right 08/07/2016   Procedure: ENDARTERECTOMY FEMORAL;  Surgeon: Algernon Huxley, MD;  Location: [facility];  Service: Vascular;  Laterality: Right;  . EYE SURGERY    . JOINT REPLACEMENT     knee  . KNEE ARTHROSCOPY W/ MENISCAL REPAIR     Right   . PERIPHERAL VASCULAR BALLOON ANGIOPLASTY N/A 07/26/2016   Procedure: Peripheral Vascular Balloon Angioplasty;  Surgeon: Algernon Huxley, MD;  Location: [facility];  Service: Cardiovascular;  Laterality: N/A;  . PERIPHERAL VASCULAR CATHETERIZATION Right 01/11/2015   Procedure: Lower Extremity Angiography;  Surgeon: Algernon Huxley, MD;  Location: [facility];  Service: Cardiovascular;  Laterality: Right;  . PERIPHERAL VASCULAR CATHETERIZATION Right  06/21/2015   Procedure: Lower Extremity Angiography;  Surgeon: Algernon Huxley, MD;  Location: [facility];  Service: Cardiovascular;  Laterality: Right;  . PERIPHERAL VASCULAR CATHETERIZATION Right 06/21/2015   Procedure: Lower Extremity Intervention;  Surgeon: Algernon Huxley, MD;  Location: [facility];  Service: Cardiovascular;  Laterality: Right;  . PERIPHERAL VASCULAR CATHETERIZATION Right 10/12/2015   Procedure: Lower Extremity Angiography;  Surgeon: Algernon Huxley, MD;  Location: [facility];  Service: Cardiovascular;  Laterality: Right;  . PERIPHERAL VASCULAR CATHETERIZATION Right 10/12/2015   Procedure: Lower Extremity Intervention;  Surgeon: Algernon Huxley, MD;  Location: [facility];  Service: Cardiovascular;  Laterality: Right;  . PERIPHERAL VASCULAR CATHETERIZATION Left 02/10/2016   Procedure: Lower Extremity Angiography;  Surgeon: Algernon Huxley, MD;  Location: [facility];  Service: Cardiovascular;  Laterality: Left;  . PERIPHERAL VASCULAR CATHETERIZATION  02/10/2016   Procedure: Lower Extremity Intervention;  Surgeon: Algernon Huxley, MD;  Location: [facility];  Service: Cardiovascular;;  . PERIPHERAL VASCULAR CATHETERIZATION Right 02/24/2016   Procedure: Lower Extremity Angiography;  Surgeon: Algernon Huxley, MD;  Location: [facility];  Service: Cardiovascular;  Laterality: Right;  . SHOULDER CLOSED REDUCTION Right 04/10/2016   Procedure: CLOSED REDUCTION SHOULDER;  Surgeon: Hessie Knows, MD;  Location: [facility];  Service: Orthopedics;  Laterality: Right;  . TONSILLECTOMY    . TOTAL KNEE ARTHROPLASTY  2015  . VAGINAL HYSTERECTOMY  Prior to Admission medications   Medication Sig Start Date End Date Taking? Authorizing Provider  acetaminophen (TYLENOL) 500 MG tablet Take 500 mg by mouth every 6 (six) hours as needed for moderate pain or headache.   Yes [provider]  Alpha-Lipoic Acid 600 MG CAPS Take 600 mg by mouth daily.   Yes  [provider]  amLODipine (NORVASC) 5 MG tablet Take 5 mg by mouth daily.   Yes [provider]  calcium carbonate (OSCAL) 1500 (600 Ca) MG TABS tablet Take 600 mg of elemental calcium by mouth in the morning and at bedtime.   Yes [provider]  carvedilol (COREG) 25 MG tablet Take 25 mg by mouth 2 (two) times daily with a meal.   Yes [provider]  cloNIDine (CATAPRES) 0.1 MG tablet Take 0.1 mg by mouth 2 (two) times daily.   Yes [provider]  Coenzyme Q10 (COQ10) 50 MG CAPS Take 50 mg by mouth daily.    Yes [provider]  Cyanocobalamin (B-12 COMPLIANCE INJECTION) 1000 MCG/ML KIT Inject 1,000 mcg as directed every 30 (thirty) days.   Yes [provider]  donepezil (ARICEPT) 10 MG tablet Take 10 mg by mouth at bedtime.   Yes [provider]  rosuvastatin (CRESTOR) 10 MG tablet Take 10 mg by mouth daily.   Yes [provider]  TURMERIC PO Take 125 mg by mouth daily.   Yes [provider]  CRESTOR 5 MG tablet TAKE 1 TABLET BY MOUTH EVERY DAY Patient not taking: Reported on 08/06/2019 04/14/14   Wellington Hampshire, MD  ELIQUIS 5 MG TABS tablet Take 1 tablet by mouth twice a day Patient taking differently: Take 5 mg by mouth 2 (two) times daily.  07/11/19   Ashley Hartmann, NP  hydrochlorothiazide (HYDRODIURIL) 25 MG tablet TAKE 1 TABLET BY MOUTH EVERY DAY Patient not taking: Reported on 08/06/2019 05/03/15   Wellington Hampshire, MD    Allergies as of 08/05/2019 - Review Complete 09/11/2018  Allergen Reaction Noted  . Penicillins Anaphylaxis and Other (See Comments) 05/30/2012  . Lipitor [atorvastatin] Other (See Comments) 07/21/2016  . Statins Other (See Comments) 05/30/2012    Family History  Problem Relation Age of Onset  . Alcohol abuse Father   . Cancer Father   . Diabetes Paternal Grandfather   . Breast cancer Daughter 23    Social History   Socioeconomic History  . Marital status:  Divorced    Spouse name: Not on file  . Number of children: Not on file  . Years of education: Not on file  . Highest education level: Not on file  Occupational History  . Not on file  Tobacco Use  . Smoking status: Former Smoker    Packs/day: 1.00    Years: 10.00    Pack years: 10.00    Types: Cigarettes    Quit date: 02/23/1981    Years since quitting: 38.4  . Smokeless tobacco: Never Used  Substance and Sexual Activity  . Alcohol use: No    Comment: rare occ  . Drug use: No  . Sexual activity: Never  Other Topics Concern  . Not on file  Social History Narrative   Does not have a living will.    Wants daughter to be HPOA.   Does want CPR but no prolonged life support.   Social Determinants of Health   Financial Resource Strain:   . Difficulty of Paying Living Expenses:   Food Insecurity:   .  Worried About Charity fundraiser in the Last Year:   . Arboriculturist in the Last Year:   Transportation Needs:   . Film/video editor (Medical):   Marland Kitchen Lack of Transportation (Non-Medical):   Physical Activity:   . Days of Exercise per Week:   . Minutes of Exercise per Session:   Stress:   . Feeling of Stress :   Social Connections:   . Frequency of Communication with Friends and Family:   . Frequency of Social Gatherings with Friends and Family:   . Attends Religious Services:   . Active Member of Clubs or Organizations:   . Attends Archivist Meetings:   Marland Kitchen Marital Status:   Intimate Partner Violence:   . Fear of Current or Ex-Partner:   . Emotionally Abused:   Marland Kitchen Physically Abused:   . Sexually Abused:     Review of Systems: See HPI, otherwise negative ROS  Physical Exam: BP (!) 211/112   Pulse 99   Temp (!) 97.1 F (36.2 C) (Temporal)   Resp 19   Ht 5' 5"  (1.651 m)   Wt 71.2 kg   SpO2 98%   BMI 26.13 kg/m  General:   Alert,  pleasant and cooperative in NAD Head:  Normocephalic and atraumatic. Neck:  Supple; no masses or  thyromegaly. Lungs:  Clear throughout to auscultation.    Heart:  Regular rate and rhythm. Abdomen:  Soft, nontender and nondistended. Normal bowel sounds, without guarding, and without rebound.   Neurologic:  Alert and  oriented x4;  grossly normal neurologically.  Impression/Plan: KEARAH GAYDEN is here for an ERCP to be performed for CBD stone  Risks, benefits, limitations, and alternatives regarding  ERCP have been reviewed with the patient.  Questions have been answered.  All parties agreeable.   Ashley Lame, MD  08/07/2019, 2:26 PM

## 2019-08-07 NOTE — Transfer of Care (Signed)
Immediate Anesthesia Transfer of Care Note  Patient: [name]  Procedure(s) Performed: ENDOSCOPIC RETROGRADE CHOLANGIOPANCREATOGRAPHY (ERCP) (N/A )  Patient Location: PACU  Anesthesia Type:General  Level of Consciousness: awake and drowsy  Airway & Oxygen Therapy: Patient Spontanous Breathing and Patient connected to nasal cannula oxygen  Post-op Assessment: Report given to RN and Post -op Vital signs reviewed and stable  Post vital signs: Reviewed and stable  Last Vitals:  Vitals Value Taken Time  BP    Temp    Pulse 98 08/07/19 1525  Resp 21 08/07/19 1525  SpO2 96 % 08/07/19 1525  Vitals shown include unvalidated device data.  Last Pain:  Vitals:   08/07/19 1330  TempSrc: Temporal  PainSc: 0-No pain         Complications: No apparent anesthesia complications

## 2019-08-07 NOTE — Anesthesia Preprocedure Evaluation (Signed)
Anesthesia Evaluation  Patient identified by MRN, date of birth, ID band Patient awake    Reviewed: Allergy & Precautions, H&P , NPO status , reviewed documented beta blocker date and time   Airway Mallampati: II  TM Distance: >3 FB Neck ROM: limited    Dental  (+) Caps, Chipped, Dental Advidsory Given   Pulmonary former smoker,    Pulmonary exam normal        Cardiovascular hypertension, + Peripheral Vascular Disease  Normal cardiovascular exam+ dysrhythmias + Valvular Problems/Murmurs   S/P ablation, heart rthymn stable   Neuro/Psych Seizures -,  PSYCHIATRIC DISORDERS Dementia    GI/Hepatic   Endo/Other    Renal/GU      Musculoskeletal  (+) Arthritis ,   Abdominal   Peds  Hematology   Anesthesia Other Findings Past Medical History: No date: Dyslipidemia No date: Dysrhythmia No date: Ectopic atrial tachycardia ([facility]) No date: Environmental allergies No date: H/O scarlet fever No date: Heart murmur     Comment:  IN PAST No date: HOH (hard of hearing) No date: Hyperlipidemia No date: Hypertension No date: Peripheral vascular disease ([facility]) No date: 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 Harvie Bridge, [place]. No date: Transient cerebral ischemia  Past Surgical History: No date: ABLATION OF DYSRHYTHMIC FOCUS No date: AUGMENTATION MAMMAPLASTY; Bilateral 08/28/2017: CATARACT EXTRACTION W/PHACO; Left     Comment:  Procedure: CATARACT EXTRACTION PHACO AND INTRAOCULAR               LENS PLACEMENT (IOC);  Surgeon: Galen Manila, MD;                Location: [facility];  Service: Ophthalmology;  Laterality:              Left;  [place] 00:26 AP% 18.4 CDE 4.85 Fluid pa ck lot #               [id number] No date: CORONARY ANGIOPLASTY 08/07/2016: ENDARTERECTOMY FEMORAL; Right     Comment:  Procedure: ENDARTERECTOMY FEMORAL;  Surgeon:  Annice Needy, MD;  Location: [facility];  Service: Vascular;                Laterality: Right; No date: EYE SURGERY No date: JOINT REPLACEMENT     Comment:  knee No date: KNEE ARTHROSCOPY W/ MENISCAL REPAIR     Comment:  Right  07/26/2016: PERIPHERAL VASCULAR BALLOON ANGIOPLASTY; N/A     Comment:  Procedure: Peripheral Vascular Balloon Angioplasty;                Surgeon: Annice Needy, MD;  Location: [facility];  Service: Cardiovascular;  Laterality: N/A; 01/11/2015: PERIPHERAL VASCULAR CATHETERIZATION; Right     Comment:  Procedure: Lower Extremity Angiography;  Surgeon: Annice Needy, MD;  Location: [facility];  Service:               Cardiovascular;  Laterality: Right; 06/21/2015: PERIPHERAL VASCULAR CATHETERIZATION; Right     Comment:  Procedure: Lower Extremity Angiography;  Surgeon: Barbara Cower  Bunnie Domino, MD;  Location: [facility];  Service:               Cardiovascular;  Laterality: Right; 06/21/2015: PERIPHERAL VASCULAR CATHETERIZATION; Right     Comment:  Procedure: Lower Extremity Intervention;  Surgeon: Algernon Huxley, MD;  Location: [facility];  Service:               Cardiovascular;  Laterality: Right; 10/12/2015: PERIPHERAL VASCULAR CATHETERIZATION; Right     Comment:  Procedure: Lower Extremity Angiography;  Surgeon: Algernon Huxley, MD;  Location: [facility];  Service:               Cardiovascular;  Laterality: Right; 10/12/2015: PERIPHERAL VASCULAR CATHETERIZATION; Right     Comment:  Procedure: Lower Extremity Intervention;  Surgeon: Algernon Huxley, MD;  Location: [facility];  Service:               Cardiovascular;  Laterality: Right; 02/10/2016: PERIPHERAL VASCULAR CATHETERIZATION; Left     Comment:  Procedure: Lower Extremity Angiography;  Surgeon: Algernon Huxley, MD;  Location: [facility];  Service:                Cardiovascular;  Laterality: Left; 02/10/2016: PERIPHERAL VASCULAR CATHETERIZATION     Comment:  Procedure: Lower Extremity Intervention;  Surgeon: Algernon Huxley, MD;  Location: [facility];  Service:               Cardiovascular;; 02/24/2016: PERIPHERAL VASCULAR CATHETERIZATION; Right     Comment:  Procedure: Lower Extremity Angiography;  Surgeon: Algernon Huxley, MD;  Location: [facility];  Service:               Cardiovascular;  Laterality: Right; 04/10/2016: SHOULDER CLOSED REDUCTION; Right     Comment:  Procedure: CLOSED REDUCTION SHOULDER;  Surgeon: Hessie Knows, MD;  Location: [facility];  Service: Orthopedics;                Laterality: Right; No date: TONSILLECTOMY 2015: TOTAL KNEE ARTHROPLASTY No date: VAGINAL HYSTERECTOMY  BMI    Body Mass Index: 26.13 kg/m      Reproductive/Obstetrics                             Anesthesia Physical Anesthesia Plan  ASA: III  Anesthesia Plan: General   Post-op Pain Management:    Induction: Intravenous  PONV Risk Score and Plan: 3 and Treatment may vary due to age or medical condition and TIVA  Airway Management Planned: Nasal Cannula and Natural Airway  Additional Equipment:   Intra-op Plan:   Post-operative Plan:   Informed Consent: I have reviewed the patients History and Physical, chart, labs and discussed the procedure including the risks, benefits and alternatives for the proposed anesthesia with the patient or authorized representative who has indicated his/her  understanding and acceptance.     Dental Advisory Given  Plan Discussed with: CRNA  Anesthesia Plan Comments:         Anesthesia Quick Evaluation

## 2019-08-07 NOTE — Anesthesia Postprocedure Evaluation (Signed)
Anesthesia Post Note  Patient: Ashley Hill  Procedure(s) Performed: ENDOSCOPIC RETROGRADE CHOLANGIOPANCREATOGRAPHY (ERCP) (N/A )  Patient location during evaluation: Endoscopy Anesthesia Type: General Level of consciousness: awake and alert and oriented Pain management: pain level controlled Vital Signs Assessment: post-procedure vital signs reviewed and stable Respiratory status: spontaneous breathing Cardiovascular status: blood pressure returned to baseline Anesthetic complications: no     Last Vitals:  Vitals:   08/07/19 1524 08/07/19 1530  BP: (!) 152/94 (!) 177/102  Pulse: 97 100  Resp: 20 20  Temp: (!) 36.2 C   SpO2: 98% 94%    Last Pain:  Vitals:   08/07/19 1530  TempSrc:   PainSc: 0-No pain                 [name]

## 2019-08-07 NOTE — OR Nursing (Signed)
Received patient in recovery and got report ...as she woke up she was very confused and very agitated.  I spent the whole time in the recovery area attempting to re-orient her to person and place.  She was scared and confused the entire time she was here.  Talked to her ride, Aurea Graff, who is a Network engineer and she was very concerned and nervous to take her home in this condition.  Aurea Graff talked to [name], the daughter, and [name] wanted the doctors to admit her instead of taking her home because she was not mentally stable and she was scheduled for surgery tomorrow.   After seeing and talking to Aurea Graff, Ms Plocher seemed to be a lot more calm although still unsure of her whereabouts and why she was here.  I talked to Aurea Graff at length regarding her condition, how comfortable she was taking her home and staying with her until her daughter gets there.  They said Ms Whitacre would never be alone and they would let her drink only clear fluids until midnight.  Signs and symptoms of pancreatitis were reviewed and I also told Aurea Graff to bring Ms Leavens to the ED with concerns.

## 2019-08-08 ENCOUNTER — Ambulatory Visit: Payer: Medicare Other | Admitting: Anesthesiology

## 2019-08-08 ENCOUNTER — Ambulatory Visit
Admission: RE | Admit: 2019-08-08 | Discharge: 2019-08-08 | Disposition: A | Discharge status: Home / Self Care | Payer: Medicare Other | Source: Ambulatory Visit | Attending: General Surgery | Admitting: General Surgery

## 2019-08-08 ENCOUNTER — Encounter: Payer: Self-pay | Admitting: *Deleted

## 2019-08-08 ENCOUNTER — Encounter
Admission: RE | Disposition: A | Discharge status: Home / Self Care | Payer: Self-pay | Source: Ambulatory Visit | Attending: General Surgery

## 2019-08-08 ENCOUNTER — Other Ambulatory Visit: Payer: Self-pay

## 2019-08-08 DIAGNOSIS — K8050 Calculus of bile duct without cholangitis or cholecystitis without obstruction: Secondary | ICD-10-CM | POA: Diagnosis not present

## 2019-08-08 SURGERY — CHOLECYSTECTOMY, ROBOT-ASSISTED, LAPAROSCOPIC
Anesthesia: General | Site: Abdomen

## 2019-08-08 MED ORDER — PHENYLEPHRINE HCL (PRESSORS) 10 MG/ML IV SOLN
INTRAVENOUS | Status: DC | PRN
  Administered 2019-08-08: 200 ug via INTRAVENOUS
  Administered 2019-08-08 (×6): 100 ug via INTRAVENOUS

## 2019-08-08 MED ORDER — LACTATED RINGERS IV SOLN: INTRAVENOUS | Status: DC | PRN

## 2019-08-08 MED ORDER — ROCURONIUM BROMIDE 10 MG/ML (PF) SYRINGE
PREFILLED_SYRINGE | INTRAVENOUS | Status: AC
  Filled 2019-08-08: qty 10

## 2019-08-08 MED ORDER — INDOCYANINE GREEN 25 MG IV SOLR
1.2500 mg | Freq: Once | INTRAVENOUS | Status: AC
  Administered 2019-08-08: 1.25 mg via INTRAVENOUS
  Filled 2019-08-08: qty 10

## 2019-08-08 MED ORDER — DEXAMETHASONE SODIUM PHOSPHATE 10 MG/ML IJ SOLN
INTRAMUSCULAR | Status: DC | PRN
  Administered 2019-08-08: 10 mg via INTRAVENOUS

## 2019-08-08 MED ORDER — LABETALOL HCL 5 MG/ML IV SOLN: 20.0000 mg | Freq: Once | INTRAVENOUS | Status: AC

## 2019-08-08 MED ORDER — LABETALOL HCL 5 MG/ML IV SOLN: 10.0000 mg | Freq: Once | INTRAVENOUS | Status: AC

## 2019-08-08 MED ORDER — PROPOFOL 10 MG/ML IV BOLUS
INTRAVENOUS | Status: DC | PRN
  Administered 2019-08-08: 120 mg via INTRAVENOUS

## 2019-08-08 MED ORDER — ONDANSETRON HCL 4 MG/2ML IJ SOLN
INTRAMUSCULAR | Status: DC | PRN
  Administered 2019-08-08: 4 mg via INTRAVENOUS

## 2019-08-08 MED ORDER — LABETALOL HCL 5 MG/ML IV SOLN
INTRAVENOUS | Status: AC
  Administered 2019-08-08: 10 mg via INTRAVENOUS
  Filled 2019-08-08: qty 4

## 2019-08-08 MED ORDER — MEPERIDINE HCL 50 MG/ML IJ SOLN: 6.2500 mg | INTRAMUSCULAR | Status: DC | PRN

## 2019-08-08 MED ORDER — PHENYLEPHRINE HCL (PRESSORS) 10 MG/ML IV SOLN
INTRAVENOUS | Status: AC
  Filled 2019-08-08: qty 1

## 2019-08-08 MED ORDER — FENTANYL CITRATE (PF) 100 MCG/2ML IJ SOLN
INTRAMUSCULAR | Status: AC
  Filled 2019-08-08: qty 2

## 2019-08-08 MED ORDER — FENTANYL CITRATE (PF) 100 MCG/2ML IJ SOLN
25.0000 ug | INTRAMUSCULAR | Status: DC | PRN
  Administered 2019-08-08: 25 ug via INTRAVENOUS

## 2019-08-08 MED ORDER — BUPIVACAINE-EPINEPHRINE 0.25% -1:200000 IJ SOLN
INTRAMUSCULAR | Status: DC | PRN
  Administered 2019-08-08: 20 mL

## 2019-08-08 MED ORDER — OXYCODONE HCL 5 MG PO TABS
ORAL_TABLET | ORAL | Status: AC
  Filled 2019-08-08: qty 1

## 2019-08-08 MED ORDER — FAMOTIDINE 20 MG PO TABS
ORAL_TABLET | ORAL | Status: AC
  Filled 2019-08-08: qty 1

## 2019-08-08 MED ORDER — LABETALOL HCL 5 MG/ML IV SOLN
INTRAVENOUS | Status: AC
  Administered 2019-08-08: 20 mg via INTRAVENOUS
  Filled 2019-08-08: qty 4

## 2019-08-08 MED ORDER — ROCURONIUM BROMIDE 100 MG/10ML IV SOLN
INTRAVENOUS | Status: DC | PRN
  Administered 2019-08-08: 30 mg via INTRAVENOUS
  Administered 2019-08-08: 10 mg via INTRAVENOUS
  Administered 2019-08-08: 50 mg via INTRAVENOUS

## 2019-08-08 MED ORDER — FAMOTIDINE 20 MG PO TABS
20.0000 mg | ORAL_TABLET | Freq: Once | ORAL | Status: AC
  Administered 2019-08-08: 20 mg via ORAL

## 2019-08-08 MED ORDER — FENTANYL CITRATE (PF) 100 MCG/2ML IJ SOLN
INTRAMUSCULAR | Status: DC | PRN
  Administered 2019-08-08: 100 ug via INTRAVENOUS
  Administered 2019-08-08: 25 ug via INTRAVENOUS

## 2019-08-08 MED ORDER — PROPOFOL 10 MG/ML IV BOLUS
INTRAVENOUS | Status: AC
  Filled 2019-08-08: qty 20

## 2019-08-08 MED ORDER — FENTANYL CITRATE (PF) 100 MCG/2ML IJ SOLN
INTRAMUSCULAR | Status: AC
  Administered 2019-08-08: 25 ug via INTRAVENOUS
  Filled 2019-08-08: qty 2

## 2019-08-08 MED ORDER — CLINDAMYCIN PHOSPHATE 900 MG/50ML IV SOLN
INTRAVENOUS | Status: AC
  Filled 2019-08-08: qty 50

## 2019-08-08 MED ORDER — OXYCODONE HCL 5 MG PO TABS
5.0000 mg | ORAL_TABLET | Freq: Once | ORAL | Status: AC
  Administered 2019-08-08: 5 mg via ORAL

## 2019-08-08 MED ORDER — OXYCODONE HCL 5 MG PO TABS
5.0000 mg | ORAL_TABLET | Freq: Once | ORAL | Status: AC | PRN
  Administered 2019-08-08: 5 mg via ORAL

## 2019-08-08 MED ORDER — SUGAMMADEX SODIUM 200 MG/2ML IV SOLN
INTRAVENOUS | Status: DC | PRN
Start: 2019-08-08 — End: 2019-08-08
  Administered 2019-08-08: 142.4 mg via INTRAVENOUS

## 2019-08-08 MED ORDER — ONDANSETRON HCL 4 MG/2ML IJ SOLN: 4.0000 mg | Freq: Once | INTRAMUSCULAR | Status: DC | PRN

## 2019-08-08 MED ORDER — HYDROCODONE-ACETAMINOPHEN 5-325 MG PO TABS: 1.0000 | ORAL_TABLET | ORAL | 0 refills | Status: AC | PRN

## 2019-08-08 MED ORDER — ACETAMINOPHEN 10 MG/ML IV SOLN
INTRAVENOUS | Status: DC | PRN
  Administered 2019-08-08: 1000 mg via INTRAVENOUS

## 2019-08-08 MED ORDER — EPHEDRINE 5 MG/ML INJ
INTRAVENOUS | Status: AC
  Filled 2019-08-08: qty 10

## 2019-08-08 MED ORDER — OXYCODONE HCL 5 MG/5ML PO SOLN: 5.0000 mg | Freq: Once | ORAL | Status: AC | PRN

## 2019-08-08 MED ORDER — ACETAMINOPHEN 10 MG/ML IV SOLN
INTRAVENOUS | Status: AC
  Filled 2019-08-08: qty 100

## 2019-08-08 MED ORDER — CLINDAMYCIN PHOSPHATE 900 MG/50ML IV SOLN
900.0000 mg | INTRAVENOUS | Status: AC
  Administered 2019-08-08: 900 mg via INTRAVENOUS

## 2019-08-08 SURGICAL SUPPLY — 56 items
BAG INFUSER PRESSURE 100CC (MISCELLANEOUS) ×3 IMPLANT
BLADE SURG SZ11 CARB STEEL (BLADE) ×3 IMPLANT
CANISTER SUCT 1200ML W/VALVE (MISCELLANEOUS) ×3 IMPLANT
CANNULA REDUC XI 12-8 STAPL (CANNULA) ×1
CANNULA REDUCER 12-8 DVNC XI (CANNULA) ×2 IMPLANT
CHLORAPREP W/TINT 26 (MISCELLANEOUS) ×3 IMPLANT
CLIP VESOLOCK LG 6/CT PURPLE (CLIP) ×3 IMPLANT
CLIP VESOLOCK MED LG 6/CT (CLIP) ×3 IMPLANT
COVER WAND RF STERILE (DRAPES) ×3 IMPLANT
DECANTER SPIKE VIAL GLASS SM (MISCELLANEOUS) ×6 IMPLANT
DEFOGGER SCOPE WARMER CLEARIFY (MISCELLANEOUS) ×3 IMPLANT
DERMABOND ADVANCED (GAUZE/BANDAGES/DRESSINGS) ×1
DERMABOND ADVANCED .7 DNX12 (GAUZE/BANDAGES/DRESSINGS) ×2 IMPLANT
DRAPE ARM DVNC X/XI (DISPOSABLE) ×8 IMPLANT
DRAPE COLUMN DVNC XI (DISPOSABLE) ×2 IMPLANT
DRAPE DA VINCI XI ARM (DISPOSABLE) ×4
DRAPE DA VINCI XI COLUMN (DISPOSABLE) ×1
ELECT REM PT RETURN 9FT ADLT (ELECTROSURGICAL) ×3
ELECTRODE REM PT RTRN 9FT ADLT (ELECTROSURGICAL) ×2 IMPLANT
ENDOLOOP SUT PDS II  0 18 (SUTURE) ×3
ENDOLOOP SUT PDS II 0 18 (SUTURE) ×6 IMPLANT
GLOVE BIO SURGEON STRL SZ 6.5 (GLOVE) ×6 IMPLANT
GLOVE BIOGEL PI IND STRL 6.5 (GLOVE) ×4 IMPLANT
GLOVE BIOGEL PI INDICATOR 6.5 (GLOVE) ×2
GOWN STRL REUS W/ TWL LRG LVL3 (GOWN DISPOSABLE) ×6 IMPLANT
GOWN STRL REUS W/TWL LRG LVL3 (GOWN DISPOSABLE) ×3
GRASPER SUT TROCAR 14GX15 (MISCELLANEOUS) ×3 IMPLANT
IRRIGATOR SUCT 8 DISP DVNC XI (IRRIGATION / IRRIGATOR) ×2 IMPLANT
IRRIGATOR SUCTION 8MM XI DISP (IRRIGATION / IRRIGATOR) ×1
IV NS 1000ML (IV SOLUTION) ×1
IV NS 1000ML BAXH (IV SOLUTION) ×2 IMPLANT
KIT PINK PAD W/HEAD ARE REST (MISCELLANEOUS) ×3
KIT PINK PAD W/HEAD ARM REST (MISCELLANEOUS) ×2 IMPLANT
LABEL OR SOLS (LABEL) ×3 IMPLANT
NEEDLE HYPO 22GX1.5 SAFETY (NEEDLE) ×3 IMPLANT
NEEDLE INSUFFLATION 14GA 120MM (NEEDLE) ×3 IMPLANT
NS IRRIG 500ML POUR BTL (IV SOLUTION) ×3 IMPLANT
OBTURATOR OPTICAL STANDARD 8MM (TROCAR) ×1
OBTURATOR OPTICAL STND 8 DVNC (TROCAR) ×2
OBTURATOR OPTICALSTD 8 DVNC (TROCAR) ×2 IMPLANT
PACK LAP CHOLECYSTECTOMY (MISCELLANEOUS) ×3 IMPLANT
POUCH SPECIMEN RETRIEVAL 10MM (ENDOMECHANICALS) ×6 IMPLANT
SCISSORS METZENBAUM CVD 33 (INSTRUMENTS) ×3 IMPLANT
SEAL CANN UNIV 5-8 DVNC XI (MISCELLANEOUS) ×6 IMPLANT
SEAL XI 5MM-8MM UNIVERSAL (MISCELLANEOUS) ×3
SET TUBE SMOKE EVAC HIGH FLOW (TUBING) ×3 IMPLANT
SOLUTION ELECTROLUBE (MISCELLANEOUS) ×3 IMPLANT
STAPLER CANNULA SEAL DVNC XI (STAPLE) ×2 IMPLANT
STAPLER CANNULA SEAL XI (STAPLE) ×1
SUT MNCRL 4-0 (SUTURE) ×1
SUT MNCRL 4-0 27XMFL (SUTURE) ×2
SUT PROLENE 4 0 SH DA (SUTURE) ×3 IMPLANT
SUT VIC AB 3-0 SH 27 (SUTURE) ×1
SUT VIC AB 3-0 SH 27X BRD (SUTURE) ×2 IMPLANT
SUT VICRYL 0 AB UR-6 (SUTURE) ×3 IMPLANT
SUTURE MNCRL 4-0 27XMF (SUTURE) ×2 IMPLANT

## 2019-08-08 NOTE — Anesthesia Procedure Notes (Signed)
Procedure Name: Intubation Date/Time: 08/08/2019 2:35 PM Performed by: Henrietta Hoover, CRNA Pre-anesthesia Checklist: Patient identified, Patient being monitored, Timeout performed, Emergency Drugs available and Suction available Patient Re-evaluated:Patient Re-evaluated prior to induction Oxygen Delivery Method: Circle system utilized Preoxygenation: Pre-oxygenation with 100% oxygen Induction Type: IV induction Ventilation: Mask ventilation without difficulty Laryngoscope Size: 3 and McGraph Grade View: Grade I Tube type: Oral Tube size: 7.0 mm Number of attempts: 1 Airway Equipment and Method: Stylet Placement Confirmation: ETT inserted through vocal cords under direct vision,  positive ETCO2 and breath sounds checked- equal and bilateral Secured at: 21 cm Tube secured with: Tape Dental Injury: Teeth and Oropharynx as per pre-operative assessment

## 2019-08-08 NOTE — Transfer of Care (Signed)
Immediate Anesthesia Transfer of Care Note  Patient: Ashley Hill  Procedure(s) Performed: XI ROBOTIC ASSISTED LAPAROSCOPIC CHOLECYSTECTOMY (N/A Abdomen) INDOCYANINE GREEN FLUORESCENCE IMAGING (ICG)  Patient Location: PACU  Anesthesia Type:General  Level of Consciousness: sedated  Airway & Oxygen Therapy: Patient connected to face mask oxygen  Post-op Assessment: Post -op Vital signs reviewed and stable  Post vital signs: stable  Last Vitals:  Vitals Value Taken Time  BP 162/66 08/08/19 1722  Temp    Pulse 82 08/08/19 1723  Resp 25 08/08/19 1723  SpO2 100 % 08/08/19 1723  Vitals shown include unvalidated device data.  Last Pain:  Vitals:   08/08/19 1223  TempSrc: Tympanic  PainSc: 2          Complications: No apparent anesthesia complications

## 2019-08-08 NOTE — Op Note (Signed)
Preoperative diagnosis: History of choledocholithiasis  Postoperative diagnosis: History of choledocholithiasis  Procedure: Robotic Assisted Laparoscopic Cholecystectomy.   Anesthesia: GETA   Surgeon: Dr. Hazle Quant  Wound Classification: Clean Contaminated  Indications: Patient is a 85 y.o. female developed right upper quadrant pain and on workup was found to have cholelithiasis with dilated common duct. MRCP confirmed choledocholithiasis. ERCP done and stones removed. Robotic Assisted Laparoscopic cholecystectomy was elected.  Findings: Very dilated cystic duct Critical view of safety achieved.  Dome down technique tongue to confirm that it was a cystic duct. Cystic duct and artery identified, ligated and divided Adequate hemostasis  Description of procedure: The patient was placed on the operating table in the supine position. General anesthesia was induced. A time-out was completed verifying correct patient, procedure, site, positioning, and implant(s) and/or special equipment prior to beginning this procedure. An orogastric tube was placed. The abdomen was prepped and draped in the usual sterile fashion.  An incision was made in a natural skin line below the umbilicus.  The fascia was elevated and the Veress needle inserted. Proper position was confirmed by aspiration and saline meniscus test.  The abdomen was insufflated with carbon dioxide to a pressure of 15 mmHg. The patient tolerated insufflation well. A 8-mm trocar was then inserted in optiview fashion.  The laparoscope was inserted and the abdomen inspected. No injuries from initial trocar placement were noted. Additional trocars were then inserted in the following locations: an 8-mm trocar in the left lateral abdomen, and another two 8-mm trocars to the right side of the abdomen 5 cm appart. The umbilical trocar was changed to a 12 mm trocar all under direct visualization. The abdomen was inspected and no abnormalities were  found. The table was placed in the reverse Trendelenburg position with the right side up. The robotic arms were docked and target anatomy identified. Instrument inserted under direct visualization.  Filmy adhesions between the gallbladder and omentum, duodenum and transverse colon were lysed with electrocautery. The dome of the gallbladder was grasped with a prograsp and retracted over the dome of the liver. The infundibulum was also grasped with an atraumatic grasper and retracted toward the right lower quadrant. This maneuver exposed Calot's triangle. The peritoneum overlying the gallbladder infundibulum was then incised and the cystic duct and cystic artery identified and circumferentially dissected. Critical view of safety reviewed before ligating any structure.  The cystic artery was confirmed using firefly.  This was ligated with clips and divided.  After obtaining critical view of safety since the cystic duct was so dilated a dome down approach was done to remove the gallbladder from the liver and confirm that the cystic duct was only structure getting into the gallbladder.  Attempt to place a large clip was not successful.  Due to the large size of the cystic duct it was needed to ligate the cystic duct with Endoloop PDS.  To be able to do these, the camera was moved to arm 3 and the arm 1 and 2 were undocked.  I put 2 Endoloops proximally and 1 Endoloop close to the gallbladder.  I divided in between the Endoloops.  In addition to the Endoloops a 4-0 Prolene stitch was used to close the cystic duct.  Firefly images taken to visualize biliary ducts.  This was a very time-consuming procedure. Hemostasis was checked and the gallbladder and contained stones were removed using an endoscopic retrieval bag. The gallbladder was passed off the table as a specimen. The gallbladder fossa was copiously irrigated  with saline and hemostasis was obtained. There was no evidence of bleeding from the gallbladder fossa or  cystic artery or leakage of the bile from the cystic duct stump. Secondary trocars were removed under direct vision. No bleeding was noted. The robotic arms were undoked. The scope was withdrawn and the umbilical trocar removed. The abdomen was allowed to collapse. The fascia of the 61mm trocar sites was closed with figure-of-eight 0 vicryl sutures. The skin was closed with subcuticular sutures of 4-0 monocryl and topical skin adhesive. The orogastric tube was removed.  The patient tolerated the procedure well and was taken to the postanesthesia care unit in stable condition.   Specimen: Gallbladder  Complications: None  EBL: 25 mL

## 2019-08-08 NOTE — Anesthesia Preprocedure Evaluation (Signed)
Anesthesia Evaluation  Patient identified by MRN, date of birth, ID band Patient awake    Reviewed: Allergy & Precautions, NPO status , Patient's Chart, lab work & pertinent test results  History of Anesthesia Complications Negative for: history of anesthetic complications  Airway Mallampati: III  TM Distance: >3 FB Neck ROM: Full    Dental no notable dental hx.    Pulmonary neg sleep apnea, neg COPD, former smoker,    breath sounds clear to auscultation- rhonchi (-) wheezing      Cardiovascular hypertension, Pt. on medications + Peripheral Vascular Disease  (-) CAD, (-) Past MI, (-) Cardiac Stents and (-) CABG + dysrhythmias (s/p ablation) Supra Ventricular Tachycardia  Rhythm:Regular Rate:Normal - Systolic murmurs and - Diastolic murmurs    Neuro/Psych neg Seizures PSYCHIATRIC DISORDERS TIA   GI/Hepatic negative GI ROS, Neg liver ROS,   Endo/Other  negative endocrine ROSneg diabetes  Renal/GU negative Renal ROS     Musculoskeletal  (+) Arthritis ,   Abdominal (+) - obese,   Peds  Hematology negative hematology ROS (+)   Anesthesia Other Findings Past Medical History: No date: Dyslipidemia No date: Dysrhythmia No date: Ectopic atrial tachycardia (HCC) No date: Environmental allergies No date: H/O scarlet fever No date: Heart murmur     Comment:  IN PAST No date: HOH (hard of hearing) No date: Hyperlipidemia No date: Hypertension No date: Peripheral vascular disease (HCC) No date: 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 Harvie Bridge, [place]. No date: Transient cerebral ischemia   Reproductive/Obstetrics                             Anesthesia Physical Anesthesia Plan  ASA: III  Anesthesia Plan: General   Post-op Pain Management:    Induction: Intravenous  PONV Risk Score and Plan: 2  and Ondansetron, Dexamethasone and Treatment may vary due to age or medical condition  Airway Management Planned: Oral ETT  Additional Equipment:   Intra-op Plan:   Post-operative Plan: Extubation in OR  Informed Consent: I have reviewed the patients History and Physical, chart, labs and discussed the procedure including the risks, benefits and alternatives for the proposed anesthesia with the patient or authorized representative who has indicated his/her understanding and acceptance.     Dental advisory given  Plan Discussed with: CRNA and Anesthesiologist  Anesthesia Plan Comments:         Anesthesia Quick Evaluation

## 2019-08-08 NOTE — Anesthesia Postprocedure Evaluation (Signed)
Anesthesia Post Note  Patient: Ashley Hill  Procedure(s) Performed: XI ROBOTIC ASSISTED LAPAROSCOPIC CHOLECYSTECTOMY (N/A Abdomen) INDOCYANINE GREEN FLUORESCENCE IMAGING (ICG)  Patient location during evaluation: PACU Anesthesia Type: General Level of consciousness: awake and alert Pain management: pain level controlled Vital Signs Assessment: post-procedure vital signs reviewed and stable Respiratory status: spontaneous breathing, nonlabored ventilation and respiratory function stable Cardiovascular status: blood pressure returned to baseline Postop Assessment: no apparent nausea or vomiting Anesthetic complications: no     Last Vitals:  Vitals:   08/08/19 1907 08/08/19 1922  BP: (!) 186/72 (!) 193/69  Pulse: 80 88  Resp:    Temp:    SpO2: 94% 97%    Last Pain:  Vitals:   08/08/19 1825  TempSrc:   PainSc: 5    Hypertensive but back to her pre-op baseline.  Temporary response to IV labetalol.  Asymptomatic.  Did not take her amlodipine this morning, and due for carvedilol tonight.  Daughter states pt gets hypertensive whenever she misses a dose.  Daughter states she will give pt her meds tonight as soon as they get home.                Karleen Hampshire

## 2019-08-08 NOTE — Discharge Instructions (Signed)
  Diet: Resume home heart healthy regular diet.   Activity: No heavy lifting >20 pounds (children, pets, laundry, garbage) or strenuous activity until follow-up, but light activity and walking are encouraged. Do not drive or drink alcohol if taking narcotic pain medications.  Wound care: May shower with soapy water and pat dry (do not rub incisions), but no baths or submerging incision underwater until follow-up. (no swimming)   Medications: Resume all home medications. For mild to moderate pain: acetaminophen (Tylenol) or ibuprofen (if no kidney disease). Combining Tylenol with alcohol can substantially increase your risk of causing liver disease. Narcotic pain medications, if prescribed, can be used for severe pain, though may cause nausea, constipation, and drowsiness. Do not combine Tylenol and Norco within a 6 hour period as Norco contains Tylenol. If you do not need the narcotic pain medication, you do not need to fill the prescription.  Restart Eliquis tomorrow.   Call office [phone]) at any time if any questions, worsening pain, fevers/chills, bleeding, drainage from incision site, or other concerns.

## 2019-08-08 NOTE — Interval H&P Note (Signed)
History and Physical Interval Note:  08/08/2019 1:17 PM  Ashley Hill  has presented today for surgery, with the diagnosis of K80.50 Cholelithiasis with history of choledocholithiasis.  The various methods of treatment have been discussed with the patient and family. After consideration of risks, benefits and other options for treatment, the patient has consented to  Procedure(s): XI ROBOTIC ASSISTED LAPAROSCOPIC CHOLECYSTECTOMY (N/A) as a surgical intervention.  The patient's history has been reviewed, patient examined, no change in status, stable for surgery.  I have reviewed the patient's chart and labs.  Questions were answered to the patient's satisfaction.     Carolan Shiver

## 2019-08-12 LAB — SURGICAL PATHOLOGY

## 2019-09-05 ENCOUNTER — Encounter (INDEPENDENT_AMBULATORY_CARE_PROVIDER_SITE_OTHER): Payer: Medicare Other

## 2019-09-05 ENCOUNTER — Ambulatory Visit (INDEPENDENT_AMBULATORY_CARE_PROVIDER_SITE_OTHER): Payer: Medicare Other | Admitting: Vascular Surgery

## 2019-10-07 ENCOUNTER — Ambulatory Visit (INDEPENDENT_AMBULATORY_CARE_PROVIDER_SITE_OTHER): Payer: Medicare Other

## 2019-10-07 ENCOUNTER — Ambulatory Visit (INDEPENDENT_AMBULATORY_CARE_PROVIDER_SITE_OTHER): Payer: Medicare Other | Admitting: Vascular Surgery

## 2019-10-07 ENCOUNTER — Encounter (INDEPENDENT_AMBULATORY_CARE_PROVIDER_SITE_OTHER): Payer: Self-pay | Admitting: Vascular Surgery

## 2019-10-07 ENCOUNTER — Other Ambulatory Visit: Payer: Self-pay

## 2019-10-07 ENCOUNTER — Other Ambulatory Visit (INDEPENDENT_AMBULATORY_CARE_PROVIDER_SITE_OTHER): Payer: Medicare Other

## 2019-10-07 ENCOUNTER — Other Ambulatory Visit (INDEPENDENT_AMBULATORY_CARE_PROVIDER_SITE_OTHER): Payer: Self-pay | Admitting: Vascular Surgery

## 2019-10-07 VITALS — BP 158/77 | HR 70 | Resp 16 | Wt 144.0 lb

## 2019-10-07 DIAGNOSIS — I739 Peripheral vascular disease, unspecified: Secondary | ICD-10-CM

## 2019-10-07 DIAGNOSIS — I6523 Occlusion and stenosis of bilateral carotid arteries: Secondary | ICD-10-CM

## 2019-10-07 DIAGNOSIS — G309 Alzheimer's disease, unspecified: Secondary | ICD-10-CM | POA: Diagnosis not present

## 2019-10-07 DIAGNOSIS — R6889 Other general symptoms and signs: Secondary | ICD-10-CM

## 2019-10-07 DIAGNOSIS — I10 Essential (primary) hypertension: Secondary | ICD-10-CM

## 2019-10-07 DIAGNOSIS — E785 Hyperlipidemia, unspecified: Secondary | ICD-10-CM | POA: Diagnosis not present

## 2019-10-07 DIAGNOSIS — I70211 Atherosclerosis of native arteries of extremities with intermittent claudication, right leg: Secondary | ICD-10-CM

## 2019-10-07 DIAGNOSIS — F0280 Dementia in other diseases classified elsewhere without behavioral disturbance: Secondary | ICD-10-CM

## 2019-10-07 NOTE — Assessment & Plan Note (Signed)
Noninvasive studies today show a stable right ABI of 0.54 and a left ABI of 0.72.  Duplex shows the right SFA to have a known occlusion with moderate elevation of velocities in the left SFA and popliteal stent.  Currently no limb threatening symptoms with a situation that is stable.  Has had multiple previous interventions on the right leg with a known occlusion which is reasonably well collateralized.  Would not plan any intervention or surgery at this time.  Plan to recheck in 6 months.  Continue Eliquis and refills can be given through the pharmacy as needed.

## 2019-10-07 NOTE — Progress Notes (Signed)
MRN : [id number]  Ashley Hill is a 85 y.o. (December 30, 1934) female who presents with chief complaint of  Chief Complaint  Patient presents with  . Follow-up    ultrasound follow up  .  History of Present Illness: Patient returns today in follow up of her peripheral arterial disease.  She is doing well.  She does still have some numbness and pain in the bottom of her right foot but this has not gotten any worse and if anything is gotten some better.  No new ulceration or infection.  The pain is not waking her at night.  She seems to be walking reasonably well.  Noninvasive studies today show a stable right ABI of 0.54 and a left ABI of 0.72.  Duplex shows the right SFA to have a known occlusion with moderate elevation of velocities in the left SFA and popliteal stent.  Current Outpatient Medications  Medication Sig Dispense Refill  . acetaminophen (TYLENOL) 500 MG tablet Take 500 mg by mouth every 6 (six) hours as needed for moderate pain or headache.    . Alpha-Lipoic Acid 600 MG CAPS Take 600 mg by mouth daily.    Marland Kitchen amLODipine (NORVASC) 5 MG tablet Take 5 mg by mouth daily.    . calcium carbonate (OSCAL) 1500 (600 Ca) MG TABS tablet Take 600 mg of elemental calcium by mouth in the morning and at bedtime.    . carvedilol (COREG) 25 MG tablet Take 25 mg by mouth 2 (two) times daily with a meal.    . Cholecalciferol (D3-1000) 25 MCG (1000 UT) capsule Take 1,000 Units by mouth daily.    . cloNIDine (CATAPRES) 0.1 MG tablet Take 0.1 mg by mouth 2 (two) times daily.    . Coenzyme Q10 (COQ10) 50 MG CAPS Take 50 mg by mouth daily.     . Cyanocobalamin (B-12 COMPLIANCE INJECTION) 1000 MCG/ML KIT Inject 1,000 mcg as directed every 30 (thirty) days.    Marland Kitchen donepezil (ARICEPT) 10 MG tablet Take 10 mg by mouth at bedtime.    Marland Kitchen ELIQUIS 5 MG TABS tablet Take 1 tablet by mouth twice a day (Patient taking differently: Take 5 mg by mouth 2 (two) times daily. ) 60 tablet 2  . rosuvastatin (CRESTOR) 10 MG tablet  Take 10 mg by mouth daily.    . TURMERIC PO Take 125 mg by mouth daily.    Marland Kitchen ZINC-VITAMIN C PO Take by mouth.    . CRESTOR 5 MG tablet TAKE 1 TABLET BY MOUTH EVERY DAY (Patient not taking: Reported on 08/06/2019) 30 tablet 6   No current facility-administered medications for this visit.    Past Medical History:  Diagnosis Date  . Dyslipidemia   . Dysrhythmia   . Ectopic atrial tachycardia ([place])   . Environmental allergies   . H/O scarlet fever   . Heart murmur    IN PAST  . HOH (hard of hearing)   . Hyperlipidemia   . Hypertension   . Peripheral vascular disease ([place])   . 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 Arnold Long, [place].  . Transient cerebral ischemia     Past Surgical History:  Procedure Laterality Date  . ABLATION OF DYSRHYTHMIC FOCUS    . AUGMENTATION MAMMAPLASTY Bilateral   . CATARACT EXTRACTION W/PHACO Left 08/28/2017   Procedure: CATARACT EXTRACTION PHACO AND INTRAOCULAR LENS PLACEMENT (IOC);  Surgeon: Birder Robson, MD;  Location: [facility] ORS;  Service: Ophthalmology;  Laterality: Left;  [place] 00:26 AP% 18.4 CDE 4.85 Fluid pa ck lot # [id number]  . CORONARY ANGIOPLASTY    . ENDARTERECTOMY FEMORAL Right 08/07/2016   Procedure: ENDARTERECTOMY FEMORAL;  Surgeon: Algernon Huxley, MD;  Location: [facility];  Service: Vascular;  Laterality: Right;  . ERCP N/A 08/07/2019   Procedure: ENDOSCOPIC RETROGRADE CHOLANGIOPANCREATOGRAPHY (ERCP);  Surgeon: [name], MD;  Location: [facility] ENDOSCOPY;  Service: Endoscopy;  Laterality: N/A;  . EYE SURGERY    . JOINT REPLACEMENT     knee  . KNEE ARTHROSCOPY W/ MENISCAL REPAIR     Right   . PERIPHERAL VASCULAR BALLOON ANGIOPLASTY N/A 07/26/2016   Procedure: Peripheral Vascular Balloon Angioplasty;  Surgeon: Algernon Huxley, MD;  Location: [place] [facility];  Service: Cardiovascular;  Laterality: N/A;  . PERIPHERAL VASCULAR CATHETERIZATION Right 01/11/2015   Procedure: Lower  Extremity Angiography;  Surgeon: Algernon Huxley, MD;  Location: [place] [facility];  Service: Cardiovascular;  Laterality: Right;  . PERIPHERAL VASCULAR CATHETERIZATION Right 06/21/2015   Procedure: Lower Extremity Angiography;  Surgeon: Algernon Huxley, MD;  Location: [place] [facility];  Service: Cardiovascular;  Laterality: Right;  . PERIPHERAL VASCULAR CATHETERIZATION Right 06/21/2015   Procedure: Lower Extremity Intervention;  Surgeon: Algernon Huxley, MD;  Location: [place] [facility];  Service: Cardiovascular;  Laterality: Right;  . PERIPHERAL VASCULAR CATHETERIZATION Right 10/12/2015   Procedure: Lower Extremity Angiography;  Surgeon: Algernon Huxley, MD;  Location: [place] [facility];  Service: Cardiovascular;  Laterality: Right;  . PERIPHERAL VASCULAR CATHETERIZATION Right 10/12/2015   Procedure: Lower Extremity Intervention;  Surgeon: Algernon Huxley, MD;  Location: [place] [facility];  Service: Cardiovascular;  Laterality: Right;  . PERIPHERAL VASCULAR CATHETERIZATION Left 02/10/2016   Procedure: Lower Extremity Angiography;  Surgeon: Algernon Huxley, MD;  Location: [place] [facility];  Service: Cardiovascular;  Laterality: Left;  . PERIPHERAL VASCULAR CATHETERIZATION  02/10/2016   Procedure: Lower Extremity Intervention;  Surgeon: Algernon Huxley, MD;  Location: [place] [facility];  Service: Cardiovascular;;  . PERIPHERAL VASCULAR CATHETERIZATION Right 02/24/2016   Procedure: Lower Extremity Angiography;  Surgeon: Algernon Huxley, MD;  Location: [place] [facility];  Service: Cardiovascular;  Laterality: Right;  . SHOULDER CLOSED REDUCTION Right 04/10/2016   Procedure: CLOSED REDUCTION SHOULDER;  Surgeon: Hessie Knows, MD;  Location: [facility];  Service: Orthopedics;  Laterality: Right;  . TONSILLECTOMY    . TOTAL KNEE ARTHROPLASTY  2015  . VAGINAL HYSTERECTOMY       Social History   Tobacco Use  . Smoking status: Former Smoker    Packs/day: 1.00    Years: 10.00    Pack years: 10.00    Types:  Cigarettes    Quit date: 02/23/1981    Years since quitting: 38.6  . Smokeless tobacco: Never Used  Substance Use Topics  . Alcohol use: No    Comment: rare occ  . Drug use: No     Family History  Problem Relation Age of Onset  . Alcohol abuse Father   . Cancer Father   . Diabetes Paternal Grandfather   . Breast cancer Daughter 60    Allergies  Allergen Reactions  . Penicillins Anaphylaxis and Other (See Comments)    Did it involve swelling of the face/tongue/throat, SOB, or low BP? Yes Did it involve sudden or severe rash/hives, skin peeling, or any reaction on the inside of your mouth or nose? No Did you need to seek medical  attention at a hospital or doctor's office? Yes When did it last happen?50 + years If all above answers are "NO", may proceed with cephalosporin use.    . Lipitor [Atorvastatin] Other (See Comments)    Muscle cramps in legs     REVIEW OF SYSTEMS(Negative unless checked)  Constitutional: _0 ??Weight loss_1 ??Fever_2 ??Chills Cardiac:_3 ??Chest pain_4 ??Chest pressure_5 ??Palpitations _6 ??Shortness of breath when laying flat _7 ??Shortness of breath at rest _8 ??Shortness of breath with exertion. Vascular: _9 ??Pain in legs with walking_10 ??Pain in legsat rest_11 ??Pain in legs when laying flat _12 ??Claudication _13 ??Pain in feet when walking _14 ??Pain in feet at rest _15 ??Pain in feet when laying flat _16 ??History of DVT _17 ??Phlebitis _18 ??Swelling in legs _19 ??Varicose veins _20 ??Non-healing ulcers Pulmonary: _21 ??Uses home oxygen _22 ??Productive cough_23 ??Hemoptysis _24 ??Wheeze _25 ??COPD _26 ??Asthma Neurologic: _27 ??Dizziness _28 ??Blackouts _29 ??Seizures _30 ??History of stroke _31 ??History of TIA_32 ??Aphasia _33 ??Temporary blindness_34 ??Dysphagia _35 ??Weaknessor numbness in arms _36 ??Weakness or numbnessin legsX memory loss worsening Musculoskeletal: _37 ??Arthritis _38 ??Joint swelling _39 ??Joint  pain _40 ??Low back pain Hematologic:_41 ??Easy bruising_42 ??Easy bleeding _43 ??Hypercoagulable state _44 ??Anemic  Gastrointestinal:_45 ??Blood in stool_46 ??Vomiting blood_47 ??Gastroesophageal reflux/heartburn_48 ??Abdominal pain Genitourinary: _49 ??Chronic kidney disease _50 ??Difficulturination _51 ??Frequenturination _52 ??Burning with urination_53 ??Hematuria Skin: _54 ??Rashes _55 ??Ulcers _56 ??Wounds Psychological: _57 ??History of anxiety_58 ??History of major depression.  Physical Examination  BP (!) 158/77 (BP Location: Right Arm)   Pulse 70   Resp 16   Wt 144 lb (65.3 kg)   BMI 23.96 kg/m  Gen:  WD/WN, NAD Head: [place]/AT, No temporalis wasting. Ear/Nose/Throat: Hearing grossly intact, nares w/o erythema or drainage Eyes: Conjunctiva clear. Sclera non-icteric Neck: Supple.  Trachea midline Pulmonary:  Good air movement, no use of accessory muscles.  Cardiac: RRR, no JVD Vascular:  Vessel Right Left  Radial Palpable Palpable                          PT Not Palpable 1+ Palpable  DP Not Palpable 1+ Palpable   Gastrointestinal: soft, non-tender/non-distended. No guarding/reflex.  Musculoskeletal: M/S 5/5 throughout.  No deformity or atrophy. Trace LE edema. Neurologic: Sensation grossly intact in extremities.  Symmetrical.  Speech is fluent.  Psychiatric: Judgment intact, Mood & affect appropriate for pt's clinical situation. Dermatologic: No rashes or ulcers noted.  No cellulitis or open wounds.       Labs Recent Results (from the past 2160 hour(s))  SARS CORONAVIRUS 2 (TAT 6-24 HRS) Nasopharyngeal Nasopharyngeal Swab     Status: None   Collection Time: 08/05/19 12:39 PM   Specimen: Nasopharyngeal Swab  Result Value Ref Range   SARS Coronavirus 2 NEGATIVE NEGATIVE    Comment: (NOTE) SARS-CoV-2 target nucleic acids are NOT DETECTED. The SARS-CoV-2 RNA is generally detectable in upper and lower respiratory specimens during the acute phase of  infection. Negative results do not preclude SARS-CoV-2 infection, do not rule out co-infections with other pathogens, and should not be used as the sole basis for treatment or other patient management decisions. Negative results must be combined with clinical observations, patient history, and epidemiological information. The expected result is Negative. Fact Sheet for Patients: SugarRoll.be Fact Sheet for Healthcare Providers: https://www.woods-mathews.com/ This test is not yet approved or cleared by the [place] FDA and  has been authorized for detection and/or diagnosis of SARS-CoV-2 by FDA under an Emergency Use Authorization (EUA). This EUA will remain  in effect (meaning this test can be used) for the duration of the COVID-19 declaration under Section 56 4(b)(1) of the Act, 21 U.S.C. section 360bbb-3(b)(1), unless the authorization is terminated or revoked sooner. Performed at [facility], [place] [address] [place], [place] [address]   Surgical pathology  Status: None   Collection Time: 08/08/19  3:31 PM  Result Value Ref Range   SURGICAL PATHOLOGY      SURGICAL PATHOLOGY CASE: [id number] PATIENT: [facility] Surgical Pathology Report     Specimen Submitted: A. Gallbladder  Clinical History: K80.50 cholelithiasis     DIAGNOSIS: A. GALLBLADDER; CHOLECYSTECTOMY: - CHOLELITHIASIS AND CHRONIC CHOLECYSTITIS. - NEGATIVE FOR DYSPLASIA AND MALIGNANCY.  GROSS DESCRIPTION: A. Labeled: Gallbladder Received: In formalin Size of specimen: 10.3 x 2.9 x 2.2 cm Specimen integrity: Intact External surface: Dark green and smooth Wall thickness: 0.2-0.3 cm Mucosa: Dark green and velvety Cystic duct: 0.5 Bile present: Yes, green Stones present: There are multiple green-yellow calculi noted from 0.3 to 0.7 cm in diameter Other findings: None  Block summary: 1 -representative sections   Final Diagnosis performed by  Bryan Lemma, MD.   Electronically signed 08/12/2019 5:29:24PM The electronic signature indicates that the named Attending Pathologist has evaluated the specimen Technical component performed at [facility],  [address], [place], [place] [address] Lab: [phone] Dir: Rush Farmer, MD, MMM  Professional component performed at [facility], [facility], [place], [place], [place] [address] Lab: [phone] Dir: Dellia Nims. Reuel Derby, MD     Radiology VAS [place] ABI WITH/WO TBI  Result Date: 10/07/2019 LOWER EXTREMITY DOPPLER STUDY Indications: Peripheral artery disease.  Vascular Interventions: 01/11/15: Right distal EIA, distal SFA & popliteal artery                         PTAs;                         06/21/15: Right SFA stent;                         02/10/16: Left SFA stent with PTAs of left distal EIA,                         CFA & SFA origins                         07/26/16: Right common & profunda femoral artery PTAs;                         08/07/16: Right common, profunda & superficial femoral                         artery endarterectomy/angioplasty;. Performing Technologist: Blondell Reveal RT, RDMS, RVT  Examination Guidelines: A complete evaluation includes at minimum, Doppler waveform signals and systolic blood pressure reading at the level of bilateral brachial, anterior tibial, and posterior tibial arteries, when vessel segments are accessible. Bilateral testing is considered an integral part of a complete examination. Photoelectric Plethysmograph (PPG) waveforms and toe systolic pressure readings are included as required and additional duplex testing as needed. Limited examinations for reoccurring indications may be performed as noted.  ABI Findings: +---------+------------------+-----+----------+--------+ Right    Rt Pressure (mmHg)IndexWaveform  Comment  +---------+------------------+-----+----------+--------+ Brachial 144                                        +---------+------------------+-----+----------+--------+ ATA      87  0.54 monophasic         +---------+------------------+-----+----------+--------+ PTA                             absent             +---------+------------------+-----+----------+--------+ PERO     64                0.40 monophasic         +---------+------------------+-----+----------+--------+ Great Toe64                0.40 Abnormal           +---------+------------------+-----+----------+--------+ +---------+------------------+-----+----------+-------+ Left     Lt Pressure (mmHg)IndexWaveform  Comment +---------+------------------+-----+----------+-------+ Brachial 162                                      +---------+------------------+-----+----------+-------+ ATA      117               0.72 monophasic        +---------+------------------+-----+----------+-------+ PTA      117               0.72 monophasic        +---------+------------------+-----+----------+-------+ Lenon Ahmadi                0.54 Abnormal          +---------+------------------+-----+----------+-------+ +-------+-----------+-----------+------------+------------+ ABI/TBIToday's ABIToday's TBIPrevious ABIPrevious TBI +-------+-----------+-----------+------------+------------+ Right  0.54       0.40       0.53        0.46         +-------+-----------+-----------+------------+------------+ Left   0.72       0.54       0.89        0.74         +-------+-----------+-----------+------------+------------+ Left ABIs and TBIs appear decreased compared to prior study on 01/11/18. Right ABIs appear essentially unchanged.  Summary: Right: Resting right ankle-brachial index indicates moderate right lower extremity arterial disease. The right toe-brachial index is abnormal. Left: Resting left ankle-brachial index indicates moderate left lower extremity arterial disease. The left toe-brachial index is  abnormal.  *See table(s) above for measurements and observations.  Electronically signed by [name] MD on 10/07/2019 at 4:57:10 PM.    Final    VAS [place] LOWER EXTREMITY ARTERIAL DUPLEX  Result Date: 10/07/2019 LOWER EXTREMITY ARTERIAL DUPLEX STUDY Indications: Peripheral artery disease.  Vascular Interventions: 01/11/15: Right distal EIA, distal SFA & popliteal artery                         PTAs;                         06/21/15: Right SFA stent;                         02/10/16: Left SFA stent with PTAs of left distal EIA,                         CFA & SFA origins                         07/26/16: Right common & profunda femoral artery PTAs;  08/07/16: Right common, profunda & superficial femoral                         artery endarterectomy/angioplasty;. Current ABI:            Right=0.54 & Left=0.72 Performing Technologist: Blondell Reveal RT, RDMS, RVT  Examination Guidelines: A complete evaluation includes B-mode imaging, spectral Doppler, color Doppler, and power Doppler as needed of all accessible portions of each vessel. Bilateral testing is considered an integral part of a complete examination. Limited examinations for reoccurring indications may be performed as noted.  +-----------+--------+-----+--------+----------+--------+ RIGHT      PSV cm/sRatioStenosisWaveform  Comments +-----------+--------+-----+--------+----------+--------+ CFA Mid    126                  biphasic           +-----------+--------+-----+--------+----------+--------+ SFA Prox                occluded                   +-----------+--------+-----+--------+----------+--------+ SFA Mid                 occluded                   +-----------+--------+-----+--------+----------+--------+ SFA Distal              occluded                   +-----------+--------+-----+--------+----------+--------+ POP                     occluded                    +-----------+--------+-----+--------+----------+--------+ ATA Distal 49                   monophasic         +-----------+--------+-----+--------+----------+--------+ PTA Distal              occluded                   +-----------+--------+-----+--------+----------+--------+ PERO Distal20                   monophasic         +-----------+--------+-----+--------+----------+--------+  +----------+--------+-----+---------------+----------+--------+ LEFT      PSV cm/sRatioStenosis       Waveform  Comments +----------+--------+-----+---------------+----------+--------+ CFA Mid   170                         biphasic           +----------+--------+-----+---------------+----------+--------+ SFA Prox  99                          monophasicstent    +----------+--------+-----+---------------+----------+--------+ SFA Mid   306          50-74% stenosismonophasicstent    +----------+--------+-----+---------------+----------+--------+ SFA Distal97                          monophasic         +----------+--------+-----+---------------+----------+--------+ POP Prox  293          50-74% stenosismonophasic         +----------+--------+-----+---------------+----------+--------+ POP Distal64  monophasic         +----------+--------+-----+---------------+----------+--------+ ATA Distal83                          monophasic         +----------+--------+-----+---------------+----------+--------+ PTA Distal29                          monophasic         +----------+--------+-----+---------------+----------+--------+  Summary: Right: Known occlusion noted in the superficial femoral artery and/or popliteal artery. Left: 50-74% stenosis noted in the superficial femoral artery. 50-74% stenosis noted in the popliteal artery. New stenotic areas noted in the left proximal/mid SFA and distal SFA/popliteal regions when compared to the previous  exam on 11/14/16. No significant change in the right lower extremity.  See table(s) above for measurements and observations. Electronically signed by [name] MD on 10/07/2019 at 4:57:04 PM.    Final     Assessment/Plan Hypertension blood pressure control important in reducing the progression of atherosclerotic disease. On appropriate oral medications.   Dementia in Alzheimer's disease Seems to bereasonably stable today  Hyperlipidemia lipid control important in reducing the progression of atherosclerotic disease. Continue statin therapy  Atherosclerosis of native arteries of extremity with intermittent claudication (HCC) Noninvasive studies today show a stable right ABI of 0.54 and a left ABI of 0.72.  Duplex shows the right SFA to have a known occlusion with moderate elevation of velocities in the left SFA and popliteal stent.  Currently no limb threatening symptoms with a situation that is stable.  Has had multiple previous interventions on the right leg with a known occlusion which is reasonably well collateralized.  Would not plan any intervention or surgery at this time.  Plan to recheck in 6 months.  Continue Eliquis and refills can be given through the pharmacy as needed.  Bilateral carotid artery stenosis No focal neurologic symptoms.  Plan to check this later this year with duplex.    [name], MD  10/07/2019 5:03 PM    This note was created with Dragon medical transcription system.  Any errors from dictation are purely unintentional

## 2019-10-07 NOTE — Patient Instructions (Signed)

## 2019-10-07 NOTE — Assessment & Plan Note (Signed)
No focal neurologic symptoms.  Plan to check this later this year with duplex.

## 2020-02-05 ENCOUNTER — Other Ambulatory Visit: Payer: Self-pay

## 2020-02-05 ENCOUNTER — Encounter: Payer: Self-pay | Admitting: Emergency Medicine

## 2020-02-05 ENCOUNTER — Emergency Department
Admission: EM | Admit: 2020-02-05 | Discharge: 2020-02-05 | Disposition: A | Discharge status: Home / Self Care | Payer: Medicare Other | Attending: Emergency Medicine | Admitting: Emergency Medicine

## 2020-02-05 DIAGNOSIS — Z87891 Personal history of nicotine dependence: Secondary | ICD-10-CM | POA: Insufficient documentation

## 2020-02-05 DIAGNOSIS — F0390 Unspecified dementia without behavioral disturbance: Secondary | ICD-10-CM | POA: Diagnosis not present

## 2020-02-05 DIAGNOSIS — G8929 Other chronic pain: Secondary | ICD-10-CM | POA: Insufficient documentation

## 2020-02-05 DIAGNOSIS — Z7901 Long term (current) use of anticoagulants: Secondary | ICD-10-CM | POA: Diagnosis not present

## 2020-02-05 DIAGNOSIS — I10 Essential (primary) hypertension: Secondary | ICD-10-CM | POA: Insufficient documentation

## 2020-02-05 DIAGNOSIS — M79672 Pain in left foot: Secondary | ICD-10-CM | POA: Diagnosis present

## 2020-02-05 DIAGNOSIS — Z96659 Presence of unspecified artificial knee joint: Secondary | ICD-10-CM | POA: Insufficient documentation

## 2020-02-05 DIAGNOSIS — Z79899 Other long term (current) drug therapy: Secondary | ICD-10-CM | POA: Diagnosis not present

## 2020-02-05 DIAGNOSIS — M79671 Pain in right foot: Secondary | ICD-10-CM

## 2020-02-05 MED ORDER — LIDOCAINE 5 % EX PTCH: 1.0000 | MEDICATED_PATCH | CUTANEOUS | 0 refills | Status: DC

## 2020-02-05 NOTE — ED Notes (Signed)
See triage note  Presents with pain to both feet  Describes pain to left foot as "soreness"  Pain is right foot is 10/10   No swelling noted  Good pulses

## 2020-02-05 NOTE — ED Provider Notes (Signed)
[facility] Emergency Department Provider Note  ____________________________________________  Time seen: Approximately 2:28 PM  I have reviewed the triage vital signs and the nursing notes.   HISTORY  Chief Complaint Pain    HPI Ashley Hill is a 85 y.o. female that presents to emergency department for evaluation of chronic bilateral foot pain, worse on the right.  Her left foot is not bothering her today but her right foot has been intermittently painful.  She states that her foot pain does not stop her from doing what she wants to do.  She has been out gardening today.  Patient sees Dr. Lucky Cowboy for PAD and neuropathy and last saw him in May.  She sees Dr. Doy Hutching for primary care and saw him in August.  She takes Tylenol for her foot pain but states that it does not always work.  She takes Eliquis daily for previous clot.  She came to the emergency department today for pain control.  No trauma.  No leg pain.  Past Medical History:  Diagnosis Date  . Dyslipidemia   . Dysrhythmia   . Ectopic atrial tachycardia ([place])   . Environmental allergies   . H/O scarlet fever   . Heart murmur    IN PAST  . HOH (hard of hearing)   . Hyperlipidemia   . Hypertension   . Peripheral vascular disease ([place])   . 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 Arnold Long, [place].  . Transient cerebral ischemia     Patient Active Problem List   Diagnosis Date Noted  . Calculus of bile duct with acute cholecystitis and obstruction   . Abnormal gait 07/15/2018  . History of radiofrequency ablation (RFA) procedure for cardiac arrhythmia 07/15/2018  . Knee pain 07/15/2018  . Knee stiff 07/15/2018  . B12 deficiency 06/14/2018  . Seizure disorder ([place]) 05/22/2017  . PAD (peripheral artery disease) ([place]) 03/23/2017  . Bilateral carotid artery stenosis 03/23/2017  . Atherosclerosis of artery of extremity with rest pain ([place])  08/07/2016  . Dementia in Alzheimer's disease ([place]) 07/26/2016  . Alcohol abuse 07/26/2016  . Ischemic leg 07/25/2016  . Pain in limb 07/21/2016  . Closed fracture dislocation of right shoulder joint, initial encounter 04/10/2016  . Hypotension due to drugs 03/08/2016  . Atherosclerosis of native arteries of extremity with intermittent claudication ([place]) 06/30/2015  . Polymyalgia rheumatica ([place]) 10/27/2014  . Atrioventricular nodal re-entry tachycardia ([place]) 10/05/2014  . Lesion of right parietal bone 01/27/2014  . Left sided numbness 01/05/2014  . Medicare annual wellness visit, initial 12/09/2013  . Bleeding 12/09/2013  . Dizziness and giddiness 12/09/2013  . Preop cardiovascular exam 04/21/2013  . Arthritis, degenerative 10/23/2012  . Chest pain 07/22/2012  . SVT (supraventricular tachycardia) ([place])   . Hypertension   . Hyperlipidemia     Past Surgical History:  Procedure Laterality Date  . ABLATION OF DYSRHYTHMIC FOCUS    . AUGMENTATION MAMMAPLASTY Bilateral   . CATARACT EXTRACTION W/PHACO Left 08/28/2017   Procedure: CATARACT EXTRACTION PHACO AND INTRAOCULAR LENS PLACEMENT (IOC);  Surgeon: Birder Robson, MD;  Location: [facility] ORS;  Service: Ophthalmology;  Laterality: Left;  [place] 00:26 AP% 18.4 CDE 4.85 Fluid pa ck lot # [id number]  . CORONARY ANGIOPLASTY    . ENDARTERECTOMY FEMORAL Right 08/07/2016   Procedure: ENDARTERECTOMY FEMORAL;  Surgeon: Algernon Huxley, MD;  Location: [facility] ORS;  Service: Vascular;  Laterality: Right;  . ERCP N/A 08/07/2019  Procedure: ENDOSCOPIC RETROGRADE CHOLANGIOPANCREATOGRAPHY (ERCP);  Surgeon: [name], MD;  Location: [facility] ENDOSCOPY;  Service: Endoscopy;  Laterality: N/A;  . EYE SURGERY    . JOINT REPLACEMENT     knee  . KNEE ARTHROSCOPY W/ MENISCAL REPAIR     Right   . PERIPHERAL VASCULAR BALLOON ANGIOPLASTY N/A 07/26/2016   Procedure: Peripheral Vascular Balloon Angioplasty;  Surgeon: Algernon Huxley, MD;  Location: [place] CV LAB;   Service: Cardiovascular;  Laterality: N/A;  . PERIPHERAL VASCULAR CATHETERIZATION Right 01/11/2015   Procedure: Lower Extremity Angiography;  Surgeon: Algernon Huxley, MD;  Location: [place] [facility];  Service: Cardiovascular;  Laterality: Right;  . PERIPHERAL VASCULAR CATHETERIZATION Right 06/21/2015   Procedure: Lower Extremity Angiography;  Surgeon: Algernon Huxley, MD;  Location: [place] [facility];  Service: Cardiovascular;  Laterality: Right;  . PERIPHERAL VASCULAR CATHETERIZATION Right 06/21/2015   Procedure: Lower Extremity Intervention;  Surgeon: Algernon Huxley, MD;  Location: [place] [facility];  Service: Cardiovascular;  Laterality: Right;  . PERIPHERAL VASCULAR CATHETERIZATION Right 10/12/2015   Procedure: Lower Extremity Angiography;  Surgeon: Algernon Huxley, MD;  Location: [place] [facility];  Service: Cardiovascular;  Laterality: Right;  . PERIPHERAL VASCULAR CATHETERIZATION Right 10/12/2015   Procedure: Lower Extremity Intervention;  Surgeon: Algernon Huxley, MD;  Location: [place] [facility];  Service: Cardiovascular;  Laterality: Right;  . PERIPHERAL VASCULAR CATHETERIZATION Left 02/10/2016   Procedure: Lower Extremity Angiography;  Surgeon: Algernon Huxley, MD;  Location: [place] [facility];  Service: Cardiovascular;  Laterality: Left;  . PERIPHERAL VASCULAR CATHETERIZATION  02/10/2016   Procedure: Lower Extremity Intervention;  Surgeon: Algernon Huxley, MD;  Location: [place] [facility];  Service: Cardiovascular;;  . PERIPHERAL VASCULAR CATHETERIZATION Right 02/24/2016   Procedure: Lower Extremity Angiography;  Surgeon: Algernon Huxley, MD;  Location: [place] [facility];  Service: Cardiovascular;  Laterality: Right;  . SHOULDER CLOSED REDUCTION Right 04/10/2016   Procedure: CLOSED REDUCTION SHOULDER;  Surgeon: Hessie Knows, MD;  Location: [facility] ORS;  Service: Orthopedics;  Laterality: Right;  . TONSILLECTOMY    . TOTAL KNEE ARTHROPLASTY  2015  . VAGINAL HYSTERECTOMY      Prior to Admission  medications   Medication Sig Start Date End Date Taking? Authorizing Provider  acetaminophen (TYLENOL) 500 MG tablet Take 500 mg by mouth every 6 (six) hours as needed for moderate pain or headache.    [provider]  Alpha-Lipoic Acid 600 MG CAPS Take 600 mg by mouth daily.    [provider]  amLODipine (NORVASC) 5 MG tablet Take 5 mg by mouth daily.    [provider]  calcium carbonate (OSCAL) 1500 (600 Ca) MG TABS tablet Take 600 mg of elemental calcium by mouth in the morning and at bedtime.    [provider]  carvedilol (COREG) 25 MG tablet Take 25 mg by mouth 2 (two) times daily with a meal.    [provider]  Cholecalciferol (D3-1000) 25 MCG (1000 UT) capsule Take 1,000 Units by mouth daily.    [provider]  cloNIDine (CATAPRES) 0.1 MG tablet Take 0.1 mg by mouth 2 (two) times daily.    [provider]  Coenzyme Q10 (COQ10) 50 MG CAPS Take 50 mg by mouth daily.     [provider]  Cyanocobalamin (B-12 COMPLIANCE INJECTION) 1000 MCG/ML KIT Inject 1,000 mcg as directed every 30 (thirty) days.    [provider]  donepezil (ARICEPT) 10 MG tablet  Take 10 mg by mouth at bedtime.    [provider]  ELIQUIS 5 MG TABS tablet Take 1 tablet by mouth twice a day Patient taking differently: Take 5 mg by mouth 2 (two) times daily.  07/11/19   Kris Hartmann, NP  lidocaine (LIDODERM) 5 % Place 1 patch onto the skin daily. Remove & Discard patch within 12 hours or as directed by MD 02/05/20   Laban Emperor, PA-C  rosuvastatin (CRESTOR) 10 MG tablet Take 10 mg by mouth daily.    [provider]  TURMERIC PO Take 125 mg by mouth daily.    [provider]  ZINC-VITAMIN C PO Take by mouth.    [provider]    Allergies Penicillins and Lipitor [atorvastatin]  Family History  Problem Relation Age of Onset  . Alcohol abuse Father   . Cancer Father   . Diabetes Paternal  Grandfather   . Breast cancer Daughter 49    Social History Social History   Tobacco Use  . Smoking status: Former Smoker    Packs/day: 1.00    Years: 10.00    Pack years: 10.00    Types: Cigarettes    Quit date: 02/23/1981    Years since quitting: 38.9  . Smokeless tobacco: Never Used  Vaping Use  . Vaping Use: Never used  Substance Use Topics  . Alcohol use: No    Comment: rare occ  . Drug use: No     Review of Systems  Musculoskeletal: Positive for bilateral foot pain. Skin: Negative for rash, abrasions, lacerations, ecchymosis. Neurological: Negative for numbness or tingling   ____________________________________________   PHYSICAL EXAM:  VITAL SIGNS: ED Triage Vitals  Enc Vitals Group     BP 02/05/20 1234 132/66     Pulse Rate 02/05/20 1234 86     Resp 02/05/20 1234 18     Temp 02/05/20 1234 98.7 F (37.1 C)     Temp Source 02/05/20 1234 Oral     SpO2 02/05/20 1234 95 %     Weight 02/05/20 1221 144 lb (65.3 kg)     Height 02/05/20 1221 5' 5" (1.651 m)     Head Circumference --      Peak Flow --      Pain Score 02/05/20 1221 9     Pain Loc --      Pain Edu? --      Excl. in [place]? --      Constitutional: Alert and oriented. Well appearing and in no acute distress.  Repeats several of the same information over.  Talkative. Eyes: Conjunctivae are normal. PERRL. EOMI. Head: Atraumatic. ENT:      Ears:      Nose: No congestion/rhinnorhea.      Mouth/Throat: Mucous membranes are moist.  Neck: No stridor.   Cardiovascular: Normal rate, regular rhythm.  Good peripheral circulation.  Palpable dorsalis pedis and tibial pulses bilaterally.  Dorsalis pedis and tibial pulses also identified using Doppler. Respiratory: Normal respiratory effort without tachypnea or retractions. Lungs CTAB. Good air entry to the bases with no decreased or absent breath sounds. Musculoskeletal: Full range of motion to all extremities. No gross deformities appreciated.  Feet are  warm to touch.  No calf or leg tenderness.  Full range of motion of bilateral ankles.  Full range of motion of all toes.  Normal gait. Neurologic:  Normal speech and language. No gross focal neurologic deficits are appreciated.  Skin:  Skin is warm, dry and intact.  No rash noted.   ____________________________________________   LABS (all labs ordered are listed, but only abnormal results are displayed)  Labs Reviewed - No data to display ____________________________________________  EKG   ____________________________________________  RADIOLOGY  No results found.  ____________________________________________    PROCEDURES  Procedure(s) performed:    Procedures    Medications - No data to display   ____________________________________________   INITIAL IMPRESSION / ASSESSMENT AND PLAN / ED COURSE  Pertinent labs & imaging results that were available during my care of the patient were reviewed by me and considered in my medical decision making (see chart for details).  Review of the [place] CSRS was performed in accordance of the [place] prior to dispensing any controlled drugs.   Patient presented to the emergency department for evaluation of chronic bilateral foot pain.  Vital signs and exam are reassuring.  Patient has palpable and identified posterior and dorsalis pedis pulses with Doppler.  Feet are warm to touch. Patient will be discharged home with prescriptions for lidoderm patch. Patient is to follow up with PCP and vascular surgery as directed. Patient is given ED precautions to return to the ED for any worsening or new symptoms.   Ashley Hill was evaluated in Emergency Department on 02/05/2020 for the symptoms described in the history of present illness. She was evaluated in the context of the global COVID-19 pandemic, which necessitated consideration that the patient might be at risk for infection with the SARS-CoV-2 virus that causes COVID-19. Institutional  protocols and algorithms that pertain to the evaluation of patients at risk for COVID-19 are in a state of rapid change based on information released by regulatory bodies including the CDC and federal and state organizations. These policies and algorithms were followed during the patient's care in the ED.  ____________________________________________  FINAL CLINICAL IMPRESSION(S) / ED DIAGNOSES  Final diagnoses:  Pain in both feet      NEW MEDICATIONS STARTED DURING THIS VISIT:  ED Discharge Orders         Ordered    lidocaine (LIDODERM) 5 %  Every 24 hours        02/05/20 1437              This chart was dictated using voice recognition software/Dragon. Despite best efforts to proofread, errors can occur which can change the meaning. Any change was purely unintentional.    Laban Emperor, PA-C 02/05/20 1539    Duffy Bruce, MD 02/06/20 [phone]

## 2020-02-05 NOTE — ED Triage Notes (Signed)
Pt reports has neuropathy in both of her feet and sees Dr. Judithann Sheen for it but he has not been able to help her control her pain so she called and told the office that she was coming to the ED. Pt reports needs pain control

## 2020-04-13 ENCOUNTER — Encounter (INDEPENDENT_AMBULATORY_CARE_PROVIDER_SITE_OTHER): Payer: Medicare Other

## 2020-04-13 ENCOUNTER — Other Ambulatory Visit (INDEPENDENT_AMBULATORY_CARE_PROVIDER_SITE_OTHER): Payer: Self-pay | Admitting: Nurse Practitioner

## 2020-04-13 ENCOUNTER — Ambulatory Visit (INDEPENDENT_AMBULATORY_CARE_PROVIDER_SITE_OTHER): Payer: Medicare Other | Admitting: Vascular Surgery

## 2020-04-19 DIAGNOSIS — I4819 Other persistent atrial fibrillation: Secondary | ICD-10-CM | POA: Diagnosis present

## 2020-04-30 ENCOUNTER — Emergency Department: Payer: Medicare Other

## 2020-04-30 ENCOUNTER — Other Ambulatory Visit: Payer: Self-pay

## 2020-04-30 ENCOUNTER — Observation Stay
Admission: EM | Admit: 2020-04-30 | Discharge: 2020-05-02 | Disposition: A | Discharge status: Home / Self Care | Payer: Medicare Other | Attending: Internal Medicine | Admitting: Internal Medicine

## 2020-04-30 ENCOUNTER — Encounter: Payer: Self-pay | Admitting: Emergency Medicine

## 2020-04-30 ENCOUNTER — Observation Stay
Admit: 2020-04-30 | Discharge: 2020-04-30 | Disposition: A | Discharge status: Home / Self Care | Payer: Medicare Other | Attending: Internal Medicine | Admitting: Internal Medicine

## 2020-04-30 ENCOUNTER — Observation Stay: Payer: Medicare Other

## 2020-04-30 DIAGNOSIS — Z7901 Long term (current) use of anticoagulants: Secondary | ICD-10-CM | POA: Diagnosis not present

## 2020-04-30 DIAGNOSIS — E785 Hyperlipidemia, unspecified: Secondary | ICD-10-CM | POA: Diagnosis present

## 2020-04-30 DIAGNOSIS — R0789 Other chest pain: Secondary | ICD-10-CM

## 2020-04-30 DIAGNOSIS — Z87891 Personal history of nicotine dependence: Secondary | ICD-10-CM | POA: Insufficient documentation

## 2020-04-30 DIAGNOSIS — I110 Hypertensive heart disease with heart failure: Principal | ICD-10-CM | POA: Insufficient documentation

## 2020-04-30 DIAGNOSIS — Z79899 Other long term (current) drug therapy: Secondary | ICD-10-CM | POA: Diagnosis not present

## 2020-04-30 DIAGNOSIS — F0280 Dementia in other diseases classified elsewhere without behavioral disturbance: Secondary | ICD-10-CM | POA: Diagnosis present

## 2020-04-30 DIAGNOSIS — G309 Alzheimer's disease, unspecified: Secondary | ICD-10-CM | POA: Insufficient documentation

## 2020-04-30 DIAGNOSIS — I4891 Unspecified atrial fibrillation: Secondary | ICD-10-CM | POA: Insufficient documentation

## 2020-04-30 DIAGNOSIS — Z96651 Presence of right artificial knee joint: Secondary | ICD-10-CM | POA: Insufficient documentation

## 2020-04-30 DIAGNOSIS — I482 Chronic atrial fibrillation, unspecified: Secondary | ICD-10-CM | POA: Diagnosis not present

## 2020-04-30 DIAGNOSIS — I4819 Other persistent atrial fibrillation: Secondary | ICD-10-CM | POA: Diagnosis present

## 2020-04-30 DIAGNOSIS — J9600 Acute respiratory failure, unspecified whether with hypoxia or hypercapnia: Secondary | ICD-10-CM | POA: Diagnosis present

## 2020-04-30 DIAGNOSIS — I509 Heart failure, unspecified: Secondary | ICD-10-CM | POA: Diagnosis not present

## 2020-04-30 DIAGNOSIS — Z20822 Contact with and (suspected) exposure to covid-19: Secondary | ICD-10-CM | POA: Diagnosis not present

## 2020-04-30 DIAGNOSIS — F1010 Alcohol abuse, uncomplicated: Secondary | ICD-10-CM | POA: Diagnosis present

## 2020-04-30 DIAGNOSIS — G459 Transient cerebral ischemic attack, unspecified: Secondary | ICD-10-CM | POA: Diagnosis present

## 2020-04-30 DIAGNOSIS — R0602 Shortness of breath: Secondary | ICD-10-CM | POA: Diagnosis present

## 2020-04-30 DIAGNOSIS — I10 Essential (primary) hypertension: Secondary | ICD-10-CM

## 2020-04-30 LAB — COMPREHENSIVE METABOLIC PANEL
ALT: 30 U/L (ref 0–44)
AST: 35 U/L (ref 15–41)
Albumin: 3.8 g/dL (ref 3.5–5.0)
Alkaline Phosphatase: 66 U/L (ref 38–126)
Anion gap: 12 (ref 5–15)
BUN: 27 mg/dL — ABNORMAL HIGH (ref 8–23)
CO2: 21 mmol/L — ABNORMAL LOW (ref 22–32)
Calcium: 9.2 mg/dL (ref 8.9–10.3)
Chloride: 106 mmol/L (ref 98–111)
Creatinine, Ser: 0.98 mg/dL (ref 0.44–1.00)
GFR, Estimated: 57 mL/min — ABNORMAL LOW (ref >60)
Glucose, Bld: 167 mg/dL — ABNORMAL HIGH (ref 70–99)
Potassium: 3.7 mmol/L (ref 3.5–5.1)
Sodium: 139 mmol/L (ref 135–145)
Total Bilirubin: 1.1 mg/dL (ref 0.3–1.2)
Total Protein: 6.5 g/dL (ref 6.5–8.1)

## 2020-04-30 LAB — CBC WITH DIFFERENTIAL/PLATELET
Abs Immature Granulocytes: 0.05 10*3/uL (ref 0.00–0.07)
Basophils Absolute: 0.1 10*3/uL (ref 0.0–0.1)
Basophils Relative: 1 %
Eosinophils Absolute: 0.1 10*3/uL (ref 0.0–0.5)
Eosinophils Relative: 1 %
HCT: 40 % (ref 36.0–46.0)
Hemoglobin: 13.3 g/dL (ref 12.0–15.0)
Immature Granulocytes: 1 %
Lymphocytes Relative: 10 %
Lymphs Abs: 1 10*3/uL (ref 0.7–4.0)
MCH: 31.8 pg (ref 26.0–34.0)
MCHC: 33.3 g/dL (ref 30.0–36.0)
MCV: 95.7 fL (ref 80.0–100.0)
Monocytes Absolute: 0.6 10*3/uL (ref 0.1–1.0)
Monocytes Relative: 6 %
Neutro Abs: 8.5 10*3/uL — ABNORMAL HIGH (ref 1.7–7.7)
Neutrophils Relative %: 81 %
Platelets: 188 10*3/uL (ref 150–400)
RBC: 4.18 MIL/uL (ref 3.87–5.11)
RDW: 13.1 % (ref 11.5–15.5)
WBC: 10.4 10*3/uL (ref 4.0–10.5)
nRBC: 0 % (ref 0.0–0.2)

## 2020-04-30 LAB — URINALYSIS, COMPLETE (UACMP) WITH MICROSCOPIC
Bilirubin Urine: NEGATIVE
Glucose, UA: NEGATIVE mg/dL
Ketones, ur: NEGATIVE mg/dL
Leukocytes,Ua: NEGATIVE
Nitrite: NEGATIVE
Protein, ur: NEGATIVE mg/dL
Specific Gravity, Urine: 1.008 (ref 1.005–1.030)
Squamous Epithelial / HPF: NONE SEEN (ref 0–5)
pH: 5 (ref 5.0–8.0)

## 2020-04-30 LAB — TROPONIN I (HIGH SENSITIVITY)
Troponin I (High Sensitivity): 7 ng/L (ref <18)
Troponin I (High Sensitivity): 5 ng/L (ref <18)

## 2020-04-30 LAB — BRAIN NATRIURETIC PEPTIDE: B Natriuretic Peptide: 328.8 pg/mL — ABNORMAL HIGH (ref 0.0–100.0)

## 2020-04-30 LAB — RESP PANEL BY RT-PCR (FLU A&B, COVID) ARPGX2
Influenza A by PCR: NEGATIVE
Influenza B by PCR: NEGATIVE
SARS Coronavirus 2 by RT PCR: NEGATIVE

## 2020-04-30 LAB — MAGNESIUM: Magnesium: 1.9 mg/dL (ref 1.7–2.4)

## 2020-04-30 MED ORDER — HALOPERIDOL LACTATE 5 MG/ML IJ SOLN
INTRAMUSCULAR | Status: AC
  Administered 2020-04-30: 2 mg via INTRAVENOUS
  Filled 2020-04-30: qty 1

## 2020-04-30 MED ORDER — LORAZEPAM 2 MG/ML IJ SOLN
0.0000 mg | Freq: Four times a day (QID) | INTRAMUSCULAR | Status: DC
  Administered 2020-04-30: 1 mg via INTRAVENOUS

## 2020-04-30 MED ORDER — APIXABAN 5 MG PO TABS
5.0000 mg | ORAL_TABLET | Freq: Two times a day (BID) | ORAL | Status: DC
  Administered 2020-05-01 – 2020-05-02 (×3): 5 mg via ORAL
  Filled 2020-04-30 (×3): qty 1

## 2020-04-30 MED ORDER — LORAZEPAM 1 MG PO TABS: 1.0000 mg | ORAL_TABLET | ORAL | Status: DC | PRN

## 2020-04-30 MED ORDER — FUROSEMIDE 10 MG/ML IJ SOLN
40.0000 mg | Freq: Once | INTRAMUSCULAR | Status: AC
  Administered 2020-04-30: 40 mg via INTRAVENOUS
  Filled 2020-04-30: qty 4

## 2020-04-30 MED ORDER — FOLIC ACID 1 MG PO TABS
1.0000 mg | ORAL_TABLET | Freq: Every day | ORAL | Status: DC
  Administered 2020-05-01 – 2020-05-02 (×2): 1 mg via ORAL
  Filled 2020-04-30 (×2): qty 1

## 2020-04-30 MED ORDER — SODIUM CHLORIDE 0.9 % IV SOLN
250.0000 mL | INTRAVENOUS | Status: DC | PRN
Start: 2020-04-30 — End: 2020-05-02

## 2020-04-30 MED ORDER — SODIUM CHLORIDE 0.9% FLUSH
3.0000 mL | Freq: Two times a day (BID) | INTRAVENOUS | Status: DC
  Administered 2020-04-30 – 2020-05-02 (×4): 3 mL via INTRAVENOUS

## 2020-04-30 MED ORDER — LORAZEPAM 2 MG/ML IJ SOLN: 0.0000 mg | Freq: Two times a day (BID) | INTRAMUSCULAR | Status: DC

## 2020-04-30 MED ORDER — ALBUTEROL SULFATE HFA 108 (90 BASE) MCG/ACT IN AERS
2.0000 | INHALATION_SPRAY | RESPIRATORY_TRACT | Status: DC | PRN
  Filled 2020-04-30: qty 6.7

## 2020-04-30 MED ORDER — THIAMINE HCL 100 MG PO TABS
100.0000 mg | ORAL_TABLET | Freq: Every day | ORAL | Status: DC
  Administered 2020-05-01 – 2020-05-02 (×2): 100 mg via ORAL
  Filled 2020-04-30 (×2): qty 1

## 2020-04-30 MED ORDER — HALOPERIDOL LACTATE 5 MG/ML IJ SOLN
INTRAMUSCULAR | Status: AC
  Filled 2020-04-30: qty 1

## 2020-04-30 MED ORDER — ACETAMINOPHEN 325 MG PO TABS: 650.0000 mg | ORAL_TABLET | Freq: Four times a day (QID) | ORAL | Status: DC | PRN

## 2020-04-30 MED ORDER — SODIUM CHLORIDE 0.9% FLUSH: 3.0000 mL | INTRAVENOUS | Status: DC | PRN

## 2020-04-30 MED ORDER — CLONIDINE HCL 0.1 MG PO TABS
0.1000 mg | ORAL_TABLET | Freq: Two times a day (BID) | ORAL | Status: DC
  Administered 2020-05-01 – 2020-05-02 (×3): 0.1 mg via ORAL
  Filled 2020-04-30 (×3): qty 1

## 2020-04-30 MED ORDER — DM-GUAIFENESIN ER 30-600 MG PO TB12: 1.0000 | ORAL_TABLET | Freq: Two times a day (BID) | ORAL | Status: DC | PRN

## 2020-04-30 MED ORDER — HALOPERIDOL LACTATE 5 MG/ML IJ SOLN: 2.0000 mg | Freq: Once | INTRAMUSCULAR | Status: AC

## 2020-04-30 MED ORDER — ALPRAZOLAM 0.5 MG PO TABS
0.2500 mg | ORAL_TABLET | Freq: Two times a day (BID) | ORAL | Status: DC | PRN
  Filled 2020-04-30: qty 1

## 2020-04-30 MED ORDER — ADULT MULTIVITAMIN W/MINERALS CH
1.0000 | ORAL_TABLET | Freq: Every day | ORAL | Status: DC
  Administered 2020-05-01 – 2020-05-02 (×2): 1 via ORAL
  Filled 2020-04-30 (×2): qty 1

## 2020-04-30 MED ORDER — DILTIAZEM HCL ER COATED BEADS 120 MG PO CP24
240.0000 mg | ORAL_CAPSULE | Freq: Every day | ORAL | Status: DC
  Administered 2020-05-01 – 2020-05-02 (×2): 240 mg via ORAL
  Filled 2020-04-30: qty 1
  Filled 2020-04-30: qty 2
  Filled 2020-04-30: qty 1
  Filled 2020-04-30: qty 2

## 2020-04-30 MED ORDER — LORAZEPAM 2 MG/ML IJ SOLN
1.0000 mg | INTRAMUSCULAR | Status: DC | PRN
  Filled 2020-04-30: qty 1

## 2020-04-30 MED ORDER — FUROSEMIDE 10 MG/ML IJ SOLN
20.0000 mg | Freq: Two times a day (BID) | INTRAMUSCULAR | Status: DC
  Administered 2020-05-01: 20 mg via INTRAVENOUS
  Filled 2020-04-30: qty 4

## 2020-04-30 MED ORDER — ONDANSETRON HCL 4 MG/2ML IJ SOLN: 4.0000 mg | Freq: Three times a day (TID) | INTRAMUSCULAR | Status: DC | PRN

## 2020-04-30 MED ORDER — HYDRALAZINE HCL 20 MG/ML IJ SOLN: 5.0000 mg | INTRAMUSCULAR | Status: DC | PRN

## 2020-04-30 MED ORDER — ROSUVASTATIN CALCIUM 10 MG PO TABS
10.0000 mg | ORAL_TABLET | Freq: Every day | ORAL | Status: DC
  Administered 2020-05-01 – 2020-05-02 (×2): 10 mg via ORAL
  Filled 2020-04-30 (×4): qty 1

## 2020-04-30 MED ORDER — THIAMINE HCL 100 MG/ML IJ SOLN
100.0000 mg | Freq: Every day | INTRAMUSCULAR | Status: DC
  Filled 2020-04-30: qty 2

## 2020-04-30 MED ORDER — HALOPERIDOL LACTATE 5 MG/ML IJ SOLN
2.5000 mg | Freq: Four times a day (QID) | INTRAMUSCULAR | Status: DC | PRN
  Administered 2020-04-30 – 2020-05-01 (×2): 2.5 mg via INTRAVENOUS
  Filled 2020-04-30 (×2): qty 1

## 2020-04-30 MED ORDER — CARVEDILOL 25 MG PO TABS
25.0000 mg | ORAL_TABLET | Freq: Two times a day (BID) | ORAL | Status: DC
  Administered 2020-05-01 – 2020-05-02 (×3): 25 mg via ORAL
  Filled 2020-04-30 (×3): qty 1

## 2020-04-30 MED ORDER — NITROGLYCERIN 2 % TD OINT
1.0000 [in_us] | TOPICAL_OINTMENT | Freq: Once | TRANSDERMAL | Status: AC
  Administered 2020-04-30: 1 [in_us] via TOPICAL

## 2020-04-30 NOTE — ED Provider Notes (Addendum)
Nurse came to get his this patient is awaiting inpatient bed being managed by hospitalist but here in our ER. Patient become increasingly agitated worsening hypoxic respiratory failure. Patient significantly dyspneic and altered. Family at bedside. She appears to be in worsening respiratory failure secondary to probable pulmonary edema. Respiratory called for BiPAP. She is hypertensive have placed and some Nitropaste. Suspect she needs Lasix. Given her agitation I am concerned that she may not tolerate BiPAP but she is ripping off the nonrebreather. She was given 2 of IV Haldol with some improvement. Discussed the potential for worsening respiratory failure with the daughter and POA at bedside and possible need for intubation. Daughter is on the fence on whether she wants her to be intubated. Reiterated her critical condition and that if she does deteriorate may require intubation or comfort measures.  .Critical Care Performed by: Willy Eddy, MD Authorized by: Willy Eddy, MD   Critical care provider statement:    Critical care time (minutes):  30   Critical care time was exclusive of:  Separately billable procedures and treating other patients   Critical care was necessary to treat or prevent imminent or life-threatening deterioration of the following conditions:  Respiratory failure   Critical care was time spent personally by me on the following activities:  Development of treatment plan with patient or surrogate, discussions with consultants, evaluation of patient's response to treatment, examination of patient, obtaining history from patient or surrogate, ordering and performing treatments and interventions, ordering and review of laboratory studies, ordering and review of radiographic studies, pulse oximetry, re-evaluation of patient's condition and review of old charts       Willy Eddy, MD 04/30/20 2152    Willy Eddy, MD 04/30/20 2152

## 2020-04-30 NOTE — ED Notes (Signed)
Patient visualized resting, bipap and heart monitor remain on patient. Daughter bedside.  PO Medications held at this time d/t contraindication of bipap.  Haldol held at this time d/t patient resting. Will continue to monitor.

## 2020-04-30 NOTE — H&P (Addendum)
History and Physical    Ashley Hill ION:[id number] DOB: [birth year] DOA: 04/30/2020  Referring MD/NP/PA:   PCP: [name], MD   Patient coming from:  The patient is coming from home.  At baseline, pt is independent for most of ADL.        Chief Complaint: SOB  HPI: Ashley Hill is a 85 y.o. 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  Per her daughter, patient developed shortness of breath since last night, which has been progressively worsening.  Patient has mild cough, no fever or chills.  Patient has chest heaviness, but no chest pain.  Patient has nausea and vomited last night, currently no active nausea, vomiting, diarrhea or abdominal pain.  Patient denies symptoms of UTI.  She moves all extremities normally. Pt also c/o right shoulder pain radiating into the neck. She state that she had hx of fracture many years ago.   Pt was 90% RA at home, placed on 2L with EMS, currently 93 to 94% on room air. Pt initially has soft bp of 94/56, currently 123/73 in ED.   Per her daughter, in case covid19 test comes back positive, patient should not be given remdesivir and Covid vaccine, but patient can be treated with steroid and baricitinib.  ED Course: pt was found to have BNP 328, troponin level 7, pending COVID-19 PCR, WBC 10.4, temperature normal, blood pressure 123/73, heart rate 74, RR 24, chest x-ray showed pulmonary edema bilaterally with a small pleural effusion.  Patient is placed on progressive bed of observation, Dr. Ubaldo Glassing of cardiology is consulted.   Review of Systems:   General: no fevers, chills, no body weight gain, has fatigue HEENT: no blurry vision, or sore throat Respiratory: has dyspnea, coughing, no wheezing CV: has chest tightness, no  palpitations GI: had nausea, vomiting, no abdominal pain, diarrhea, constipation GU: no dysuria, burning on urination, increased urinary frequency, hematuria  Ext: has traced leg edema Neuro: no unilateral weakness, numbness, or tingling, no vision change or hearing loss Skin: no rash, no skin tear. MSK: No muscle spasm, no deformity, no limitation of range of movement in spin Heme: No easy bruising.  Travel history: No recent long distant travel.  Allergy:  Allergies  Allergen Reactions  . Penicillins Anaphylaxis and Other (See Comments)    Did it involve swelling of the face/tongue/throat, SOB, or low BP? Yes Did it involve sudden or severe rash/hives, skin peeling, or any reaction on the inside of your mouth or nose? No Did you need to seek medical attention at a hospital or doctor's office? Yes When did it last happen?50 + years If all above answers are "NO", may proceed with cephalosporin use.    . Lipitor [Atorvastatin] Other (See Comments)    Muscle cramps in legs    Past Medical History:  Diagnosis Date  . Dyslipidemia   . Dysrhythmia   . Ectopic atrial tachycardia ([place])   . Environmental allergies   . H/O scarlet fever   . Heart murmur    IN PAST  . HOH (hard of hearing)   . Hyperlipidemia   . Hypertension   . Peripheral vascular disease ([place])   . 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 Arnold Long, [place].  Marland Kitchen  Transient cerebral ischemia     Past Surgical History:  Procedure Laterality Date  . ABLATION OF DYSRHYTHMIC FOCUS    . AUGMENTATION MAMMAPLASTY Bilateral   . CATARACT EXTRACTION W/PHACO Left 08/28/2017   Procedure: CATARACT EXTRACTION PHACO AND INTRAOCULAR LENS PLACEMENT (IOC);  Surgeon: Birder Robson, MD;  Location: [facility] ORS;  Service: Ophthalmology;  Laterality: Left;  [place] 00:26 AP% 18.4 CDE 4.85 Fluid pa ck lot # [id number]  . CORONARY ANGIOPLASTY    . ENDARTERECTOMY FEMORAL  Right 08/07/2016   Procedure: ENDARTERECTOMY FEMORAL;  Surgeon: Algernon Huxley, MD;  Location: [facility];  Service: Vascular;  Laterality: Right;  . ERCP N/A 08/07/2019   Procedure: ENDOSCOPIC RETROGRADE CHOLANGIOPANCREATOGRAPHY (ERCP);  Surgeon: [name], MD;  [facility];  Service: Endoscopy;  Laterality: N/A;  . EYE SURGERY    . JOINT REPLACEMENT     knee  . KNEE ARTHROSCOPY W/ MENISCAL REPAIR     Right   . PERIPHERAL VASCULAR BALLOON ANGIOPLASTY N/A 07/26/2016   Procedure: Peripheral Vascular Balloon Angioplasty;  Surgeon: Algernon Huxley, MD;  Location: [place] [facility];  Service: Cardiovascular;  Laterality: N/A;  . PERIPHERAL VASCULAR CATHETERIZATION Right 01/11/2015   Procedure: Lower Extremity Angiography;  Surgeon: Algernon Huxley, MD;  Location: [place] [facility];  Service: Cardiovascular;  Laterality: Right;  . PERIPHERAL VASCULAR CATHETERIZATION Right 06/21/2015   Procedure: Lower Extremity Angiography;  Surgeon: Algernon Huxley, MD;  Location: [place] [facility];  Service: Cardiovascular;  Laterality: Right;  . PERIPHERAL VASCULAR CATHETERIZATION Right 06/21/2015   Procedure: Lower Extremity Intervention;  Surgeon: Algernon Huxley, MD;  Location: [place] [facility];  Service: Cardiovascular;  Laterality: Right;  . PERIPHERAL VASCULAR CATHETERIZATION Right 10/12/2015   Procedure: Lower Extremity Angiography;  Surgeon: Algernon Huxley, MD;  Location: [place] [facility];  Service: Cardiovascular;  Laterality: Right;  . PERIPHERAL VASCULAR CATHETERIZATION Right 10/12/2015   Procedure: Lower Extremity Intervention;  Surgeon: Algernon Huxley, MD;  Location: [place] [facility];  Service: Cardiovascular;  Laterality: Right;  . PERIPHERAL VASCULAR CATHETERIZATION Left 02/10/2016   Procedure: Lower Extremity Angiography;  Surgeon: Algernon Huxley, MD;  Location: [place] [facility];  Service: Cardiovascular;  Laterality: Left;  . PERIPHERAL VASCULAR CATHETERIZATION  02/10/2016   Procedure: Lower  Extremity Intervention;  Surgeon: Algernon Huxley, MD;  Location: [place] [facility];  Service: Cardiovascular;;  . PERIPHERAL VASCULAR CATHETERIZATION Right 02/24/2016   Procedure: Lower Extremity Angiography;  Surgeon: Algernon Huxley, MD;  Location: [place] [facility];  Service: Cardiovascular;  Laterality: Right;  . SHOULDER CLOSED REDUCTION Right 04/10/2016   Procedure: CLOSED REDUCTION SHOULDER;  Surgeon: Hessie Knows, MD;  Location: [facility];  Service: Orthopedics;  Laterality: Right;  . TONSILLECTOMY    . TOTAL KNEE ARTHROPLASTY  2015  . VAGINAL HYSTERECTOMY      Social History:  reports that she quit smoking about 39 years ago. Her smoking use included cigarettes. She has a 10.00 pack-year smoking history. She has never used smokeless tobacco. She reports that she does not drink alcohol and does not use drugs.  Family History:  Family History  Problem Relation Age of Onset  . Alcohol abuse Father   . Cancer Father   . Diabetes Paternal Grandfather   . Breast cancer Daughter 49     Prior to Admission medications   Medication Sig Start Date End Date Taking? Authorizing Provider  acetaminophen (TYLENOL) 500 MG tablet Take 500 mg by mouth every  6 (six) hours as needed for moderate pain or headache.    [provider]  Alpha-Lipoic Acid 600 MG CAPS Take 600 mg by mouth daily.    [provider]  amLODipine (NORVASC) 5 MG tablet Take 5 mg by mouth daily.    [provider]  calcium carbonate (OSCAL) 1500 (600 Ca) MG TABS tablet Take 600 mg of elemental calcium by mouth in the morning and at bedtime.    [provider]  carvedilol (COREG) 25 MG tablet Take 25 mg by mouth 2 (two) times daily with a meal.    [provider]  Cholecalciferol (D3-1000) 25 MCG (1000 UT) capsule Take 1,000 Units by mouth daily.    [provider]  cloNIDine (CATAPRES) 0.1 MG tablet Take 0.1 mg by mouth 2 (two) times daily.    [provider]   Coenzyme Q10 (COQ10) 50 MG CAPS Take 50 mg by mouth daily.     [provider]  Cyanocobalamin (B-12 COMPLIANCE INJECTION) 1000 MCG/ML KIT Inject 1,000 mcg as directed every 30 (thirty) days.    [provider]  donepezil (ARICEPT) 10 MG tablet Take 10 mg by mouth at bedtime.    [provider]  ELIQUIS 5 MG TABS tablet Take 1 tablet by mouth twice a day Patient taking differently: Take 5 mg by mouth 2 (two) times daily.  07/11/19   Kris Hartmann, NP  lidocaine (LIDODERM) 5 % Place 1 patch onto the skin daily. Remove & Discard patch within 12 hours or as directed by MD 02/05/20   Laban Emperor, PA-C  rosuvastatin (CRESTOR) 10 MG tablet Take 10 mg by mouth daily.    [provider]  TURMERIC PO Take 125 mg by mouth daily.    [provider]  ZINC-VITAMIN C PO Take by mouth.    [provider]    Physical Exam: Vitals:   04/30/20 1330 04/30/20 1430 04/30/20 1500 04/30/20 1530  BP: (!) 141/76 123/73 (!) 128/96 (!) 112/98  Pulse: 74 68 71 71  Resp: 18 (!) 24 (!) 24 19  Temp:      TempSrc:      SpO2: 93% 94% 95% 97%  Weight:      Height:       General: Not in acute distress HEENT:       Eyes: PERRL, EOMI, no scleral icterus.       ENT: No discharge from the ears and nose, no pharynx injection, no tonsillar enlargement.        Neck: No JVD, no bruit, no mass felt. Heme: No neck lymph node enlargement. Cardiac: S1/S2, irregularly irregular rhythm,  No gallops or rubs. Respiratory:  No rales, wheezing, rhonchi or rubs. GI: Soft, nondistended, nontender, no rebound pain, no organomegaly, BS present. GU: No hematuria Ext: has trace leg edema bilaterally. 1+DP/PT pulse bilaterally. Musculoskeletal: No joint deformities, No joint redness or warmth, no limitation of ROM in spin. Skin: No rashes.  Neuro: Alert, oriented X3, cranial nerves II-XII grossly intact, moves all extremities. Psych: Patient is not psychotic, no suicidal or  hemocidal ideation.  Labs on Admission: I have personally reviewed following labs and imaging studies  CBC: Recent Labs  Lab 04/30/20 1313  WBC 10.4  NEUTROABS 8.5*  HGB 13.3  HCT 40.0  MCV 95.7  PLT 563   Basic Metabolic Panel: Recent Labs  Lab 04/30/20 1313  NA 139  K 3.7  CL 106  CO2 21*  GLUCOSE 167*  BUN  27*  CREATININE 0.98  CALCIUM 9.2  MG 1.9   GFR: Estimated Creatinine Clearance: 40.7 mL/min (by C-G formula based on SCr of 0.98 mg/dL). Liver Function Tests: Recent Labs  Lab 04/30/20 1313  AST 35  ALT 30  ALKPHOS 66  BILITOT 1.1  PROT 6.5  ALBUMIN 3.8   No results for input(s): LIPASE, AMYLASE in the last 168 hours. No results for input(s): AMMONIA in the last 168 hours. Coagulation Profile: No results for input(s): INR, PROTIME in the last 168 hours. Cardiac Enzymes: No results for input(s): CKTOTAL, CKMB, CKMBINDEX, TROPONINI in the last 168 hours. BNP (last 3 results) No results for input(s): PROBNP in the last 8760 hours. HbA1C: No results for input(s): HGBA1C in the last 72 hours. CBG: No results for input(s): GLUCAP in the last 168 hours. Lipid Profile: No results for input(s): CHOL, HDL, LDLCALC, TRIG, CHOLHDL, LDLDIRECT in the last 72 hours. Thyroid Function Tests: No results for input(s): TSH, T4TOTAL, FREET4, T3FREE, THYROIDAB in the last 72 hours. Anemia Panel: No results for input(s): VITAMINB12, FOLATE, FERRITIN, TIBC, IRON, RETICCTPCT in the last 72 hours. Urine analysis: No results found for: COLORURINE, APPEARANCEUR, LABSPEC, PHURINE, GLUCOSEU, HGBUR, BILIRUBINUR, KETONESUR, PROTEINUR, UROBILINOGEN, NITRITE, LEUKOCYTESUR Sepsis Labs: @LABRCNTIP (procalcitonin:4,lacticidven:4) )No results found for this or any previous visit (from the past 240 hour(s)).   Radiological Exams on Admission: DG Chest 2 View  Result Date: 04/30/2020 CLINICAL DATA:  Shortness of breath. EXAM: CHEST - 2 VIEW COMPARISON:  None. FINDINGS: Mild  cardiomegaly is noted. Bilateral pulmonary edema is noted. Small bilateral pleural effusions are noted. Bony thorax is unremarkable. No pneumothorax is seen. IMPRESSION: Bilateral pulmonary edema with small pleural effusions. Aortic Atherosclerosis (ICD10-I70.0). Electronically Signed   By: Marijo Conception M.D.   On: 04/30/2020 13:55     EKG: I have personally reviewed.  A. fib, QTc 433, low voltage, ST depression in V4-V6.  Assessment/Plan Principal Problem:   Acute CHF (congestive heart failure) (HCC) Active Problems:   Hypertension   Hyperlipidemia   Dementia in Alzheimer's disease ([place])   Alcohol abuse   Atrial fibrillation, chronic (HCC)   TIA (transient ischemic attack)   Acute CHF (congestive heart failure) [facility]): Patient has short of breath, elevated BNP, chest x-ray with pulmonary edema, clinically consistent with acute CHF.  No 2D echo on record, not sure which type of CHF, but given long history of hypertension, patient may have acute diastolic dysfunction.  Dr. Ubaldo Glassing of cardiology is consulted.  -Place to progressive unit for observation -Lasix 20 mg bid by IV -2d echo -Daily weights -strict I/O's -Low salt diet -Fluid restriction -Obtain REDs Vest reading  Hypertension: -continue home Coreg, clonidine, Cardizem -Hold amlodipine due to softer blood pressure and newly added IV Lasix  Hyperlipidemia -Crestor  Dementia in Alzheimer's disease ([place]): No behavior disturbance. Used to take donepezil, but not currently -observe closely  Atrial fibrillation, chronic (HCC) -Continue Coreg, Cardizem, Eliquis  TIA (transient ischemic attack) -On Crestor and Eliquis  Alcohol abuse: -CIWA protocol       DVT ppx: on Eliquis Code Status: Full code per her daughter who is POA Family Communication:  Yes, patient's daughter by phone Disposition Plan:  Anticipate discharge back to previous environment Consults called:  Dr. Ubaldo Glassing of card Admission status: progressive  unit for obs   Status is: Observation  The patient remains OBS appropriate and will d/c before 2 midnights.  Dispo: The patient is from: Home  Anticipated d/c is to: Home              Anticipated d/c date is: 1 day              Patient currently is not medically stable to d/c.           Date of Service 04/30/2020    Ivor Costa [facility]   If 7PM-7AM, please contact night-coverage [email or web address] 04/30/2020, 4:36 PM

## 2020-04-30 NOTE — ED Triage Notes (Signed)
Pt comes into the ED via [facility] from home c/o [facility].  Pt was 90% RA at home, placed on 2L with EMS.  Pt also c/o right shoulder pain radiating into the neck.  H/o fracture many years ago.  No known new falls.  Pt has h/o dementia.  Pt initially hypotensive with 94/56. Pt in a-fib with rate of 6-0's, with h/o afib.

## 2020-04-30 NOTE — Progress Notes (Signed)
OT Cancellation Note  Patient Details Name: Ashley Hill MRN: [id number] DOB: [birth year]   Cancelled Treatment:    Reason Eval/Treat Not Completed: Other (comment). Consult received, chart reviewed. Pt pending Covid test and cardiology consult. Will re-attempt next date as medically appropriate.   Richrd Prime, MPH, MS, OTR/L ascom [phone] 04/30/20, 4:12 PM

## 2020-04-30 NOTE — ED Notes (Addendum)
Pt allowing this RN and Meagan RN to replace BiPAP mask at this time.

## 2020-04-30 NOTE — ED Notes (Signed)
Patient removing bipap and leads for monitors. Patient states she cannot breathe. Patient educated by Alvis Lemmings, RN and this RN about importance for bipap and placed back on bipap.   Admitting NP messaged for anti anxiety medication other than available medications on MAR. Previous shift reported ativan caused patient to have increased anxiety and restlessness. New orders to be placed per Admitting NP.

## 2020-04-30 NOTE — ED Notes (Signed)
Pure-wick placed on the patient and new brief put in place. Pt tolerated well and was able to be a minimal assist.

## 2020-04-30 NOTE — Consult Note (Signed)
Cardiology Consultation Note    Patient ID: Ashley Hill, MRN: [id number], DOB/AGE: Jul 17, 1934 85 y.o. Admit date: 04/30/2020   Date of Consult: 04/30/2020 Primary Physician: [name], MD Primary Cardiologist: Dr. [name]  Chief Complaint: sob Reason for Consultation: Duanne Limerick Requesting MD: Dr. Blaine Hamper  HPI: Ashley Hill is a 85 y.o. 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  She recently established care with Dr. [name].  She is treated with amlodipine 5 mg daily, carvedilol 25 mg twice daily, clonidine 0.1 mg twice daily, Eliquis 5 mg twice daily, and Crestor 10 mg daily.  Chest x-ray showed bilateral pulmonary edema, BNP was 328, function and electrolytes were normal.  Review showed atrial fibrillation with full ventricular response with no ischemia. Past Medical History:  Diagnosis Date  . Dyslipidemia   . Dysrhythmia   . Ectopic atrial tachycardia ([place])   . Environmental allergies   . H/O scarlet fever   . Heart murmur    IN PAST  . HOH (hard of hearing)   . Hyperlipidemia   . Hypertension   . Peripheral vascular disease ([place])   . 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 Arnold Long, [place].  . Transient cerebral ischemia       Surgical History:  Past Surgical History:  Procedure Laterality Date  . ABLATION OF DYSRHYTHMIC FOCUS    . AUGMENTATION MAMMAPLASTY Bilateral   . CATARACT EXTRACTION W/PHACO Left 08/28/2017   Procedure: CATARACT EXTRACTION PHACO AND INTRAOCULAR LENS PLACEMENT (IOC);  Surgeon: Birder Robson, MD;  Location: [facility] ORS;  Service: Ophthalmology;  Laterality: Left;  [place] 00:26 AP% 18.4 CDE 4.85 Fluid pa ck lot # [id number]  . CORONARY  ANGIOPLASTY    . ENDARTERECTOMY FEMORAL Right 08/07/2016   Procedure: ENDARTERECTOMY FEMORAL;  Surgeon: Algernon Huxley, MD;  Location: [facility] ORS;  Service: Vascular;  Laterality: Right;  . ERCP N/A 08/07/2019   Procedure: ENDOSCOPIC RETROGRADE CHOLANGIOPANCREATOGRAPHY (ERCP);  Surgeon: [name], MD;  Location: [facility] ENDOSCOPY;  Service: Endoscopy;  Laterality: N/A;  . EYE SURGERY    . JOINT REPLACEMENT     knee  . KNEE ARTHROSCOPY W/ MENISCAL REPAIR     Right   . PERIPHERAL VASCULAR BALLOON ANGIOPLASTY N/A 07/26/2016   Procedure: Peripheral Vascular Balloon Angioplasty;  Surgeon: Algernon Huxley, MD;  Location: [place] [facility];  Service: Cardiovascular;  Laterality: N/A;  . PERIPHERAL VASCULAR CATHETERIZATION Right 01/11/2015   Procedure: Lower Extremity Angiography;  Surgeon: Algernon Huxley, MD;  Location: [place] [facility];  Service: Cardiovascular;  Laterality: Right;  . PERIPHERAL VASCULAR CATHETERIZATION Right 06/21/2015   Procedure: Lower Extremity Angiography;  Surgeon: Algernon Huxley, MD;  Location: [place] [facility];  Service: Cardiovascular;  Laterality: Right;  . PERIPHERAL VASCULAR CATHETERIZATION Right 06/21/2015   Procedure: Lower Extremity Intervention;  Surgeon: Algernon Huxley, MD;  Location: [place] [facility];  Service: Cardiovascular;  Laterality: Right;  . PERIPHERAL VASCULAR CATHETERIZATION Right 10/12/2015   Procedure: Lower Extremity Angiography;  Surgeon: Algernon Huxley, MD;  Location: [place] [facility];  Service: Cardiovascular;  Laterality: Right;  . PERIPHERAL VASCULAR CATHETERIZATION Right 10/12/2015   Procedure: Lower Extremity Intervention;  Surgeon: Algernon Huxley, MD;  Location: [facility]  INVASIVE CV LAB;  Service: Cardiovascular;  Laterality: Right;  . PERIPHERAL VASCULAR CATHETERIZATION Left 02/10/2016   Procedure: Lower Extremity Angiography;  Surgeon: Algernon Huxley, MD;  Location: [place] [facility];  Service: Cardiovascular;  Laterality: Left;  . PERIPHERAL VASCULAR  CATHETERIZATION  02/10/2016   Procedure: Lower Extremity Intervention;  Surgeon: Algernon Huxley, MD;  Location: [place] [facility];  Service: Cardiovascular;;  . PERIPHERAL VASCULAR CATHETERIZATION Right 02/24/2016   Procedure: Lower Extremity Angiography;  Surgeon: Algernon Huxley, MD;  Location: [place] [facility];  Service: Cardiovascular;  Laterality: Right;  . SHOULDER CLOSED REDUCTION Right 04/10/2016   Procedure: CLOSED REDUCTION SHOULDER;  Surgeon: Hessie Knows, MD;  Location: [facility] ORS;  Service: Orthopedics;  Laterality: Right;  . TONSILLECTOMY    . TOTAL KNEE ARTHROPLASTY  2015  . VAGINAL HYSTERECTOMY       Home Meds: Prior to Admission medications   Medication Sig Start Date End Date Taking? Authorizing Provider  acetaminophen (TYLENOL) 500 MG tablet Take 500 mg by mouth every 6 (six) hours as needed for moderate pain or headache.    [provider]  Alpha-Lipoic Acid 600 MG CAPS Take 600 mg by mouth daily.    [provider]  amLODipine (NORVASC) 5 MG tablet Take 5 mg by mouth daily.    [provider]  calcium carbonate (OSCAL) 1500 (600 Ca) MG TABS tablet Take 600 mg of elemental calcium by mouth in the morning and at bedtime.    [provider]  carvedilol (COREG) 25 MG tablet Take 25 mg by mouth 2 (two) times daily with a meal.    [provider]  Cholecalciferol (D3-1000) 25 MCG (1000 UT) capsule Take 1,000 Units by mouth daily.    [provider]  cloNIDine (CATAPRES) 0.1 MG tablet Take 0.1 mg by mouth 2 (two) times daily.    [provider]  Coenzyme Q10 (COQ10) 50 MG CAPS Take 50 mg by mouth daily.     [provider]  Cyanocobalamin (B-12 COMPLIANCE INJECTION) 1000 MCG/ML KIT Inject 1,000 mcg as directed every 30 (thirty) days.    [provider]  diltiazem (CARDIZEM CD) 240 MG 24 hr capsule Take 240 mg by mouth daily. 04/19/20   [provider]  donepezil (ARICEPT) 10 MG tablet Take 10  mg by mouth at bedtime.    [provider]  ELIQUIS 5 MG TABS tablet Take 1 tablet by mouth twice a day Patient taking differently: Take 5 mg by mouth 2 (two) times daily.  07/11/19   Kris Hartmann, NP  lidocaine (LIDODERM) 5 % Place 1 patch onto the skin daily. Remove & Discard patch within 12 hours or as directed by MD 02/05/20   Laban Emperor, PA-C  rosuvastatin (CRESTOR) 10 MG tablet Take 10 mg by mouth daily.    [provider]  TURMERIC PO Take 125 mg by mouth daily.    [provider]  ZINC-VITAMIN C PO Take by mouth.    [provider]    Inpatient Medications:  . furosemide  20 mg Intravenous Q12H     Allergies:  Allergies  Allergen Reactions  . Penicillins Anaphylaxis and Other (See Comments)    Did it involve swelling of the face/tongue/throat, SOB, or low BP? Yes Did it involve sudden or severe rash/hives, skin peeling, or any reaction on the inside of your mouth or nose? No Did you need to seek medical attention at a hospital or doctor's office?  Yes When did it last happen?50 + years If all above answers are "NO", may proceed with cephalosporin use.    . Lipitor [Atorvastatin] Other (See Comments)    Muscle cramps in legs    Social History   Socioeconomic History  . Marital status: Divorced    Spouse name: Not on file  . Number of children: Not on file  . Years of education: Not on file  . Highest education level: Not on file  Occupational History  . Not on file  Tobacco Use  . Smoking status: Former Smoker    Packs/day: 1.00    Years: 10.00    Pack years: 10.00    Types: Cigarettes    Quit date: 02/23/1981    Years since quitting: 39.2  . Smokeless tobacco: Never Used  Vaping Use  . Vaping Use: Never used  Substance and Sexual Activity  . Alcohol use: No    Comment: rare occ  . Drug use: No  . Sexual activity: Never  Other Topics Concern  . Not on file  Social History Narrative   Does not have a living  will.    Wants daughter to be HPOA.   Does want CPR but no prolonged life support.   Social Determinants of Health   Financial Resource Strain: Not on file  Food Insecurity: Not on file  Transportation Needs: Not on file  Physical Activity: Not on file  Stress: Not on file  Social Connections: Not on file  Intimate Partner Violence: Not on file     Family History  Problem Relation Age of Onset  . Alcohol abuse Father   . Cancer Father   . Diabetes Paternal Grandfather   . Breast cancer Daughter 73     Review of Systems: A 12-system review of systems was performed and is negative except as noted in the HPI.  Labs: No results for input(s): CKTOTAL, CKMB, TROPONINI in the last 72 hours. Lab Results  Component Value Date   WBC 10.4 04/30/2020   HGB 13.3 04/30/2020   HCT 40.0 04/30/2020   MCV 95.7 04/30/2020   PLT 188 04/30/2020    Recent Labs  Lab 04/30/20 1313  NA 139  K 3.7  CL 106  CO2 21*  BUN 27*  CREATININE 0.98  CALCIUM 9.2  PROT 6.5  BILITOT 1.1  ALKPHOS 66  ALT 30  AST 35  GLUCOSE 167*   Lab Results  Component Value Date   CHOL 200 (H) 09/09/2013   HDL 56 09/09/2013   LDLCALC 121 (H) 09/09/2013   TRIG 117 09/09/2013   No results found for: DDIMER  Radiology/Studies:  DG Chest 2 View  Result Date: 04/30/2020 CLINICAL DATA:  Shortness of breath. EXAM: CHEST - 2 VIEW COMPARISON:  None. FINDINGS: Mild cardiomegaly is noted. Bilateral pulmonary edema is noted. Small bilateral pleural effusions are noted. Bony thorax is unremarkable. No pneumothorax is seen. IMPRESSION: Bilateral pulmonary edema with small pleural effusions. Aortic Atherosclerosis (ICD10-I70.0). Electronically Signed   By: Marijo Conception M.D.   On: 04/30/2020 13:55    Wt Readings from Last 3 Encounters:  04/30/20 63.5 kg  02/05/20 61.2 kg  10/07/19 65.3 kg    EKG: Atrial fibrillation with no ischemia.  Controlled ventricular response.  Physical Exam:  Blood pressure  123/73, pulse 68, temperature 97.7 F (36.5 C), temperature source Axillary, resp. rate (!) 24, height _0  (1.676 m), weight 63.5 kg, SpO2 94 %. Body mass index is 22.6 kg/m. General:  Well developed, well nourished, in no acute distress. Head: Normocephalic, atraumatic, sclera non-icteric, no xanthomas, nares are without discharge.  Neck: Negative for carotid bruits. JVD not elevated. Lungs: Bilateral rhonchi with basilar rales. Heart: Irregular regular rhythm Abdomen: Soft, non-tender, non-distended with normoactive bowel sounds. No hepatomegaly. No rebound/guarding. No obvious abdominal masses. Msk:  Strength and tone appear normal for age. Extremities: No clubbing or cyanosis. No edema.  Distal pedal pulses are 2+ and equal bilaterally. Neuro: Alert and oriented X 3.      Assessment and Plan  85 y.o. 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  She recently established care with Dr. [name].  She is treated with amlodipine 5 mg daily, carvedilol 25 mg twice daily, clonidine 0.1 mg twice daily, Eliquis 5 mg twice daily, and Crestor 10 mg daily.  Chest x-ray showed bilateral pulmonary edema, BNP was 328, function and electrolytes were normal.  Review showed atrial fibrillation with full ventricular response with no ischemia.  CHF-Etiology of the volume overload unclear. Not on diuretic at home. EF unknown. Will review echo when avaialbe Will continue with iv diuresis and evaluate lv function.BNP mildly elevated at 328.     afib-on carvedilol, diltiazem which was recently started. If ef is low, will change drugs.     Signed, Teodoro Spray MD 04/30/2020, 3:27 PM Pager: [phone]

## 2020-04-30 NOTE — Progress Notes (Addendum)
[employer] Notified by ED physician patient developed acute respiratory distress with hypoxia requiring bipap.   Bedside sats now 96% per RT on 12/6 100% FiO2 According to bedside RN patient acutely anxious and air hunger behaviors which worsened after ativan given.  Then received Haldol per ED physician order.  Patient now on BIPAP, slightly anxious with needing to urinate but redirectable and breathing well with BIPAP in place. Daughter at bedside.  Patient now has received first dose of furosemide 40 mg.  Due to her anxious behaviors related to need to urinate, foley was recommended and patient/daughter agreed.  Patient original admitted to observation for acute heart failure.  According to daughter she had stopped her blood pressure medication some time during the year but has bee on them for the last 2 weeks as the patient lives alone but does have dementia.  The daughter has been making sure she has been taking her meds. Daughter does not want patient to be put on ventilator but would at this time like to have CPR/ACLS measures should her heart stop/lethal rhythm eceterra.  Discussed potential outcomes and great possibility of need for more supervised care due to her heart failure with underlying dementia.  Also discussed the great potential of dementia progressing in setting of acute heart failure.  Daughter is planning to discuss with her siblings to prepare for decisions at discharge when options known.  Currently patient has not had yet echo which is scheduled for tomorrow.  Daughter would prefer to try to discharge patient home tomorrow as patient has experienced worsening dementia symptoms in past during hospitalizations  This am respiratory status and acute delirium symptoms greatly improved.  Bipap weaned to oxygen by nasal cannula and their is no distress.  l Lasix 40 BID current plan  Atrial fib treatment per cardiology plan. Ventricular rate currently good at 88

## 2020-04-30 NOTE — TOC Initial Note (Signed)
Transition of Care [facility]) - Initial/Assessment Note    Patient Details  Name: Ashley Hill MRN: [id number] Date of Birth: October 23, 1934  [facility]) CM/SW Contact:    Villa Herb, LCSWA Phone Number: 04/30/2020, 6:35 PM  Clinical Narrative:                 Pt being admitted due to acute CHF. TOC received consult for CHF screen. CSW spoke with pts daughter/POA Willis,Lori [phone] to complete assessment. Pt lives alone. Pt is able to complete all ADLs independently and drives herself when needed. Pt has had HH PT services in the past though Ms. Anne Hahn is unsure of what company was used. Pt has 2 canes and a walker. Ms. Anne Hahn states that pt seems to be declining in health and is different today than she has ever seen her before. Ms. Anne Hahn wanted information about having someone come to pts home to check on her when Ms. Anne Hahn is unable to. CSW informed Ms. Willis that these services are private pay and not set up through [facility]. However, CSW informed Ms. Willis of [facility] services and possibly being able to set pt up with a [facility] RN. Ms. Anne Hahn agreeable. CSW informed that TOC will f/u before discharge.   CSW completed CHF screen with Ms. Willis. Per Ms. Anne Hahn this is a new diagnosis for pt so pt has not been weighing daily. CSW educated Ms. Willis of importance of weighing and when to call pts cardiologist. Ms. Anne Hahn states that pt does not follow a heart healthy diet but does not eat much at meals. CSW educated Ms. Willis on importance of heart healthy diet. Pt takes medications as prescribed. Pt is being set up with a cardiologist. TOC to follow.   Expected Discharge Plan: Home w Home Health Services Barriers to Discharge: Continued Medical Work up   Patient Goals and CMS Choice Patient states their goals for this hospitalization and ongoing recovery are:: Return home with [facility]   Choice offered to / list presented to : NA  Expected Discharge Plan and Services Expected Discharge  Plan: Home w Home Health Services In-house Referral: Clinical Social Work Discharge Planning Services: CM Consult Post Acute Care Choice: Home Health Living arrangements for the past 2 months: Single Family Home                 DME Arranged: N/A DME Agency: NA                  Prior Living Arrangements/Services Living arrangements for the past 2 months: Single Family Home Lives with:: Self Patient language and need for interpreter reviewed:: Yes Do you feel safe going back to the place where you live?: Yes        Care giver support system in place?: Yes (comment) [name] (Daughter)   [phone]) Current home services: DME (cane x2 and a walker) Criminal Activity/Legal Involvement Pertinent to Current Situation/Hospitalization: No - Comment as needed  Activities of Daily Living      Permission Sought/Granted                  Emotional Assessment   Attitude/Demeanor/Rapport: Unable to Assess Affect (typically observed): Unable to Assess   Alcohol / Substance Use: Not Applicable Psych Involvement: No (comment)  Admission diagnosis:  Acute CHF [facility]) [I50.9] Patient Active Problem List   Diagnosis Date Noted  . Acute CHF (congestive heart failure) (HCC) 04/30/2020  . Atrial fibrillation, chronic (HCC) 04/30/2020  . TIA (  transient ischemic attack) 04/30/2020  . Calculus of bile duct with acute cholecystitis and obstruction   . Abnormal gait 07/15/2018  . History of radiofrequency ablation (RFA) procedure for cardiac arrhythmia 07/15/2018  . Knee pain 07/15/2018  . Knee stiff 07/15/2018  . B12 deficiency 06/14/2018  . Seizure disorder (HCC) 05/22/2017  . PAD (peripheral artery disease) (HCC) 03/23/2017  . Bilateral carotid artery stenosis 03/23/2017  . Atherosclerosis of artery of extremity with rest pain (HCC) 08/07/2016  . Dementia in Alzheimer's disease (HCC) 07/26/2016  . Alcohol abuse 07/26/2016  . Ischemic leg 07/25/2016  . Pain in limb  07/21/2016  . Closed fracture dislocation of right shoulder joint, initial encounter 04/10/2016  . Hypotension due to drugs 03/08/2016  . Atherosclerosis of native arteries of extremity with intermittent claudication (HCC) 06/30/2015  . Polymyalgia rheumatica (HCC) 10/27/2014  . Atrioventricular nodal re-entry tachycardia (HCC) 10/05/2014  . Lesion of right parietal bone 01/27/2014  . Left sided numbness 01/05/2014  . Medicare annual wellness visit, initial 12/09/2013  . Bleeding 12/09/2013  . Dizziness and giddiness 12/09/2013  . Preop cardiovascular exam 04/21/2013  . Arthritis, degenerative 10/23/2012  . Chest pain 07/22/2012  . SVT (supraventricular tachycardia) (HCC)   . Hypertension   . Hyperlipidemia    PCP:  Marguarite Arbour, MD Pharmacy:   [facility] [phone] [place], [place] - [address] [place] [address] Phone: [phone] Fax: [phone]  [facility]) - [place], [place] - [address] [place] [address] [place] [address] Phone: [phone] Fax: [phone]     Social Determinants of Health (SDOH) Interventions    Readmission Risk Interventions No flowsheet data found.

## 2020-04-30 NOTE — ED Notes (Addendum)
This RN entered room to BiPAP alarming. Pt had removed BiPAP mask, unwilling to allow this RN or pt's daughter at bedside to replace BiPAP after several attempts. O2 saturation 86% on room air, pt agitated and continually pushing mask away from face.

## 2020-04-30 NOTE — ED Notes (Signed)
Pt found with decreased O2 saturation post ativan.  Pt placed on 15L non-rebreather for comfort and to increase O2 saturation.  Pt incredibly anxious at this time.

## 2020-04-30 NOTE — ED Notes (Signed)
ED Provider at bedside. 

## 2020-04-30 NOTE — ED Notes (Signed)
Respiratory at bedside. Patient placed on bipap.

## 2020-04-30 NOTE — ED Notes (Addendum)
Admitting NP bedside.  Patient at edge of bed stating she needs to use bathroom. Pt removed purewick and heart monitor stating "I don't like this take it off". This RN and B. Jon Billings, NP assisted patient to commode. Pt unable to urinate. Patient consented to indwelling catheter to B. Jon Billings, NP.  Received verbal orders for foley catheter. Gwynne Edinger, RN assisting this RN with catheter placement. Sterility maintained throughout procedure. Patient tolerated well.  Patient placed back on monitors, given blanket, lights dimmed for comfort. Daughter bedside, call bell within reach.

## 2020-04-30 NOTE — ED Provider Notes (Signed)
[facility] Emergency Department Provider Note ____________________________________________   Event Date/Time   First MD Initiated Contact with Patient 04/30/20 1258     (approximate)  I have reviewed the triage vital signs and the nursing notes.  HISTORY  Chief Complaint Shortness of Breath   HPI Ashley Hill is a 85 y.o. femalewho presents to the ED for evaluation of shortness of breath.   Chart review indicates hx HTN, HLD, atrial fibrillation on Eliquis. Recently established with cardiology 11 days ago, Dr. [name].  Patient lives at home alone and has dementia.  Ambulates with a cane.  Patient presents with her daughter, provides majority of their history due to patient's dementia, for evaluation of shortness of breath and chest pain.  Daughter reports speaking to patient over the phone yesterday and she seemed "normal."  But reports hearing from patient today that she has had some chest pain, and so they present to the ED for evaluation.  Patient initially indicates that her breathing is fine, but after daughter indicates that she seems like she is breathing heavily and fast, patient relents and indicates that she has had some shortness of breath with exertion and mild orthopnea.  Patient reports bilateral upper chest pain this morning that began at rest, nonradiating.  She took some Tylenol for this prior to arrival, and reports that it is feeling better now.  Past Medical History:  Diagnosis Date  . Dyslipidemia   . Dysrhythmia   . Ectopic atrial tachycardia ([place])   . Environmental allergies   . H/O scarlet fever   . Heart murmur    IN PAST  . HOH (hard of hearing)   . Hyperlipidemia   . Hypertension   . Peripheral vascular disease ([place])   . 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 Arnold Long, [place].  . Transient cerebral ischemia     Patient Active Problem List    Diagnosis Date Noted  . Acute CHF (congestive heart failure) ([place]) 04/30/2020  . Atrial fibrillation, chronic ([place]) 04/30/2020  . Acute CHF ([place]) 04/30/2020  . TIA (transient ischemic attack) 04/30/2020  . Calculus of bile duct with acute cholecystitis and obstruction   . Abnormal gait 07/15/2018  . History of radiofrequency ablation (RFA) procedure for cardiac arrhythmia 07/15/2018  . Knee pain 07/15/2018  . Knee stiff 07/15/2018  . B12 deficiency 06/14/2018  . Seizure disorder ([place]) 05/22/2017  . PAD (peripheral artery disease) ([place]) 03/23/2017  . Bilateral carotid artery stenosis 03/23/2017  . Atherosclerosis of artery of extremity with rest pain ([place]) 08/07/2016  . Dementia in Alzheimer's disease ([place]) 07/26/2016  . Alcohol abuse 07/26/2016  . Ischemic leg 07/25/2016  . Pain in limb 07/21/2016  . Closed fracture dislocation of right shoulder joint, initial encounter 04/10/2016  . Hypotension due to drugs 03/08/2016  . Atherosclerosis of native arteries of extremity with intermittent claudication ([place]) 06/30/2015  . Polymyalgia rheumatica ([place]) 10/27/2014  . Atrioventricular nodal re-entry tachycardia ([place]) 10/05/2014  . Lesion of right parietal bone 01/27/2014  . Left sided numbness 01/05/2014  . Medicare annual wellness visit, initial 12/09/2013  . Bleeding 12/09/2013  . Dizziness and giddiness 12/09/2013  . Preop cardiovascular exam 04/21/2013  . Arthritis, degenerative 10/23/2012  . Chest pain 07/22/2012  . SVT (supraventricular tachycardia) ([place])   . Hypertension   . Hyperlipidemia     Past Surgical History:  Procedure Laterality Date  . ABLATION OF DYSRHYTHMIC FOCUS    .  AUGMENTATION MAMMAPLASTY Bilateral   . CATARACT EXTRACTION W/PHACO Left 08/28/2017   Procedure: CATARACT EXTRACTION PHACO AND INTRAOCULAR LENS PLACEMENT (IOC);  Surgeon: Birder Robson, MD;  Location: [facility] ORS;  Service: Ophthalmology;  Laterality: Left;  [place] 00:26 AP% 18.4 CDE 4.85 Fluid pa ck  lot # [id number]  . CORONARY ANGIOPLASTY    . ENDARTERECTOMY FEMORAL Right 08/07/2016   Procedure: ENDARTERECTOMY FEMORAL;  Surgeon: Algernon Huxley, MD;  Location: [facility] ORS;  Service: Vascular;  Laterality: Right;  . ERCP N/A 08/07/2019   Procedure: ENDOSCOPIC RETROGRADE CHOLANGIOPANCREATOGRAPHY (ERCP);  Surgeon: [name], MD;  Location: [facility] ENDOSCOPY;  Service: Endoscopy;  Laterality: N/A;  . EYE SURGERY    . JOINT REPLACEMENT     knee  . KNEE ARTHROSCOPY W/ MENISCAL REPAIR     Right   . PERIPHERAL VASCULAR BALLOON ANGIOPLASTY N/A 07/26/2016   Procedure: Peripheral Vascular Balloon Angioplasty;  Surgeon: Algernon Huxley, MD;  Location: [place] CV LAB;  Service: Cardiovascular;  Laterality: N/A;  . PERIPHERAL VASCULAR CATHETERIZATION Right 01/11/2015   Procedure: Lower Extremity Angiography;  Surgeon: Algernon Huxley, MD;  Location: [place] CV LAB;  Service: Cardiovascular;  Laterality: Right;  . PERIPHERAL VASCULAR CATHETERIZATION Right 06/21/2015   Procedure: Lower Extremity Angiography;  Surgeon: Algernon Huxley, MD;  Location: [place] CV LAB;  Service: Cardiovascular;  Laterality: Right;  . PERIPHERAL VASCULAR CATHETERIZATION Right 06/21/2015   Procedure: Lower Extremity Intervention;  Surgeon: Algernon Huxley, MD;  Location: [place] CV LAB;  Service: Cardiovascular;  Laterality: Right;  . PERIPHERAL VASCULAR CATHETERIZATION Right 10/12/2015   Procedure: Lower Extremity Angiography;  Surgeon: Algernon Huxley, MD;  Location: [place] CV LAB;  Service: Cardiovascular;  Laterality: Right;  . PERIPHERAL VASCULAR CATHETERIZATION Right 10/12/2015   Procedure: Lower Extremity Intervention;  Surgeon: Algernon Huxley, MD;  Location: [place] CV LAB;  Service: Cardiovascular;  Laterality: Right;  . PERIPHERAL VASCULAR CATHETERIZATION Left 02/10/2016   Procedure: Lower Extremity Angiography;  Surgeon: Algernon Huxley, MD;  Location: [place] CV LAB;  Service: Cardiovascular;  Laterality: Left;  .  PERIPHERAL VASCULAR CATHETERIZATION  02/10/2016   Procedure: Lower Extremity Intervention;  Surgeon: Algernon Huxley, MD;  Location: [place] CV LAB;  Service: Cardiovascular;;  . PERIPHERAL VASCULAR CATHETERIZATION Right 02/24/2016   Procedure: Lower Extremity Angiography;  Surgeon: Algernon Huxley, MD;  Location: [place] CV LAB;  Service: Cardiovascular;  Laterality: Right;  . SHOULDER CLOSED REDUCTION Right 04/10/2016   Procedure: CLOSED REDUCTION SHOULDER;  Surgeon: Hessie Knows, MD;  Location: [facility] ORS;  Service: Orthopedics;  Laterality: Right;  . TONSILLECTOMY    . TOTAL KNEE ARTHROPLASTY  2015  . VAGINAL HYSTERECTOMY      Prior to Admission medications   Medication Sig Start Date End Date Taking? Authorizing Provider  acetaminophen (TYLENOL) 500 MG tablet Take 500 mg by mouth every 6 (six) hours as needed for moderate pain or headache.    [provider]  Alpha-Lipoic Acid 600 MG CAPS Take 600 mg by mouth daily.    [provider]  amLODipine (NORVASC) 5 MG tablet Take 5 mg by mouth daily.    [provider]  calcium carbonate (OSCAL) 1500 (600 Ca) MG TABS tablet Take 600 mg of elemental calcium by mouth in the morning and at bedtime.    [provider]  carvedilol (COREG) 25 MG tablet Take 25 mg by mouth 2 (two) times daily with a meal.    [provider]  Cholecalciferol (D3-1000) 25 MCG (1000 UT) capsule Take 1,000 Units by mouth daily.    [provider]  cloNIDine (CATAPRES) 0.1 MG tablet Take 0.1 mg by mouth 2 (two) times daily.    [provider]  Coenzyme Q10 (COQ10) 50 MG CAPS Take 50 mg by mouth daily.     [provider]  Cyanocobalamin (B-12 COMPLIANCE INJECTION) 1000 MCG/ML KIT Inject 1,000 mcg as directed every 30 (thirty) days.    [provider]  diltiazem (CARDIZEM CD) 240 MG 24 hr capsule Take 240 mg by mouth daily. 04/19/20   [provider]  donepezil (ARICEPT) 10 MG tablet  Take 10 mg by mouth at bedtime.    [provider]  ELIQUIS 5 MG TABS tablet Take 1 tablet by mouth twice a day Patient taking differently: Take 5 mg by mouth 2 (two) times daily.  07/11/19   Kris Hartmann, NP  lidocaine (LIDODERM) 5 % Place 1 patch onto the skin daily. Remove & Discard patch within 12 hours or as directed by MD 02/05/20   Laban Emperor, PA-C  rosuvastatin (CRESTOR) 10 MG tablet Take 10 mg by mouth daily.    [provider]  TURMERIC PO Take 125 mg by mouth daily.    [provider]  ZINC-VITAMIN C PO Take by mouth.    [provider]    Allergies Penicillins and Lipitor [atorvastatin]  Family History  Problem Relation Age of Onset  . Alcohol abuse Father   . Cancer Father   . Diabetes Paternal Grandfather   . Breast cancer Daughter 18    Social History Social History   Tobacco Use  . Smoking status: Former Smoker    Packs/day: 1.00    Years: 10.00    Pack years: 10.00    Types: Cigarettes    Quit date: 02/23/1981    Years since quitting: 39.2  . Smokeless tobacco: Never Used  Vaping Use  . Vaping Use: Never used  Substance Use Topics  . Alcohol use: No    Comment: rare occ  . Drug use: No    Review of Systems  Constitutional: No fever/chills Eyes: No visual changes. ENT: No sore throat. Cardiovascular: Positive for chest pain Respiratory: Positive for orthopnea and shortness of breath. Gastrointestinal: No abdominal pain.  No nausea, no vomiting.  No diarrhea.  No constipation. Genitourinary: Negative for dysuria. Musculoskeletal: Negative for back pain. Skin: Negative for rash. Neurological: Negative for headaches, focal weakness or numbness.  ____________________________________________   PHYSICAL EXAM:  VITAL SIGNS: Vitals:   04/30/20 1500 04/30/20 1530  BP: (!) 128/96 (!) 112/98  Pulse: 71 71  Resp: (!) 24 19  Temp:    SpO2: 95% 97%     Constitutional: Alert and pleasantly disoriented.   Joking with daughter..  No distress Eyes: Conjunctivae are normal. PERRL. EOMI. Head: Atraumatic. Nose: No congestion/rhinnorhea. Mouth/Throat: Mucous membranes are moist.  Oropharynx non-erythematous. Neck: No stridor. No cervical spine tenderness to palpation. Cardiovascular: Normal rate, regular rhythm. Grossly normal heart sounds.  Good peripheral circulation. Respiratory: Mild tachypnea to the mid 20s, no further evidence of distress.  Bibasilar crackles, otherwise clear lungs. Gastrointestinal: Soft , nondistended, nontender to palpation. No CVA tenderness. Musculoskeletal: No lower extremity tenderness nor edema.  No joint effusions. No signs of acute trauma. Neurologic:  Normal speech and language. No gross focal neurologic deficits are appreciated. No gait instability noted. Skin:  Skin is warm, dry and intact. No rash noted. Psychiatric: Mood and affect  are normal. Speech and behavior are normal.  ____________________________________________   LABS (all labs ordered are listed, but only abnormal results are displayed)  Labs Reviewed  CBC WITH DIFFERENTIAL/PLATELET - Abnormal; Notable for the following components:      Result Value   Neutro Abs 8.5 (*)    All other components within normal limits  COMPREHENSIVE METABOLIC PANEL - Abnormal; Notable for the following components:   CO2 21 (*)    Glucose, Bld 167 (*)    BUN 27 (*)    GFR, Estimated 57 (*)    All other components within normal limits  BRAIN NATRIURETIC PEPTIDE - Abnormal; Notable for the following components:   B Natriuretic Peptide 328.8 (*)    All other components within normal limits  RESP PANEL BY RT-PCR (FLU A&B, COVID) ARPGX2  MAGNESIUM  URINALYSIS, COMPLETE (UACMP) WITH MICROSCOPIC  TROPONIN I (HIGH SENSITIVITY)   ____________________________________________  12 Lead EKG  A. fib, rate of 74 bpm.  Normal axis.  Normal intervals.  Inferior and lateral T wave inversions without STEMI  criteria. ____________________________________________  RADIOLOGY  ED MD interpretation: 2 view CXR reviewed by me with evidence of pulmonary vascular congestion and small bilateral pleural effusions without discrete lobar infiltration  Official radiology report(s): DG Chest 2 View  Result Date: 04/30/2020 CLINICAL DATA:  Shortness of breath. EXAM: CHEST - 2 VIEW COMPARISON:  None. FINDINGS: Mild cardiomegaly is noted. Bilateral pulmonary edema is noted. Small bilateral pleural effusions are noted. Bony thorax is unremarkable. No pneumothorax is seen. IMPRESSION: Bilateral pulmonary edema with small pleural effusions. Aortic Atherosclerosis (ICD10-I70.0). Electronically Signed   By: Marijo Conception M.D.   On: 04/30/2020 13:55    ____________________________________________   PROCEDURES and INTERVENTIONS  Procedure(s) performed (including Critical Care):  .1-3 Lead EKG Interpretation Performed by: Vladimir Crofts, MD Authorized by: Vladimir Crofts, MD     Interpretation: normal     ECG rate:  70   ECG rate assessment: normal     Rhythm: atrial fibrillation     Ectopy: none     Conduction: normal      Medications  albuterol (VENTOLIN HFA) 108 (90 Base) MCG/ACT inhaler 2 puff (has no administration in time range)  dextromethorphan-guaiFENesin (MUCINEX DM) 30-600 MG per 12 hr tablet 1 tablet (has no administration in time range)  ondansetron (ZOFRAN) injection 4 mg (has no administration in time range)  acetaminophen (TYLENOL) tablet 650 mg (has no administration in time range)  hydrALAZINE (APRESOLINE) injection 5 mg (has no administration in time range)  sodium chloride flush (NS) 0.9 % injection 3 mL (has no administration in time range)  sodium chloride flush (NS) 0.9 % injection 3 mL (has no administration in time range)  0.9 %  sodium chloride infusion (has no administration in time range)  furosemide (LASIX) injection 20 mg (has no administration in time range)  ALPRAZolam  (XANAX) tablet 0.25 mg (has no administration in time range)   ____________________________________________   MDM / ED COURSE   85 year old woman presents to the ED with vague complaints of shortness of breath and chest pain, found of evidence of new onset CHF in the setting of recently being diagnosed with A. Fib requiring medical admission.  Normal vitals on room air.  She has sats as low as 90% on room air.  Exam without any respiratory distress or stigmata of fluid overload.  She is mildly tachypneic as her only pathology on my exam.  Blood work shows evidence of new onset CHF  with elevated BNP and congested CXR with small bilateral pleural effusions.  Will admit to hospitalist for further work-up and management of her new onset CHF.    ____________________________________________   FINAL CLINICAL IMPRESSION(S) / ED DIAGNOSES  Final diagnoses:  Acute congestive heart failure, unspecified heart failure type ([place])  SOB (shortness of breath)  Other chest pain     ED Discharge Orders    None       Roselyne Stalnaker   Note:  This document was prepared using Set designer software and may include unintentional dictation errors.   Vladimir Crofts, MD 04/30/20 [phone]

## 2020-05-01 DIAGNOSIS — I482 Chronic atrial fibrillation, unspecified: Secondary | ICD-10-CM | POA: Diagnosis not present

## 2020-05-01 DIAGNOSIS — I509 Heart failure, unspecified: Secondary | ICD-10-CM

## 2020-05-01 DIAGNOSIS — G309 Alzheimer's disease, unspecified: Secondary | ICD-10-CM

## 2020-05-01 DIAGNOSIS — F0280 Dementia in other diseases classified elsewhere without behavioral disturbance: Secondary | ICD-10-CM | POA: Diagnosis not present

## 2020-05-01 DIAGNOSIS — I110 Hypertensive heart disease with heart failure: Secondary | ICD-10-CM | POA: Diagnosis not present

## 2020-05-01 LAB — ECHOCARDIOGRAM COMPLETE
AV Mean grad: 4 mmHg
AV Peak grad: 5.7 mmHg
Ao pk vel: 1.19 m/s
Height: 66 in
MV M vel: 6.56 m/s
MV Peak grad: 172.1 mmHg
S' Lateral: 1.81 cm
Weight: 2240 oz

## 2020-05-01 LAB — BASIC METABOLIC PANEL
Anion gap: 10 (ref 5–15)
BUN: 22 mg/dL (ref 8–23)
CO2: 29 mmol/L (ref 22–32)
Calcium: 9.4 mg/dL (ref 8.9–10.3)
Chloride: 101 mmol/L (ref 98–111)
Creatinine, Ser: 0.94 mg/dL (ref 0.44–1.00)
GFR, Estimated: >60 mL/min (ref >60)
Glucose, Bld: 128 mg/dL — ABNORMAL HIGH (ref 70–99)
Potassium: 3.5 mmol/L (ref 3.5–5.1)
Sodium: 140 mmol/L (ref 135–145)

## 2020-05-01 LAB — CBC
HCT: 40.4 % (ref 36.0–46.0)
Hemoglobin: 13.4 g/dL (ref 12.0–15.0)
MCH: 31.7 pg (ref 26.0–34.0)
MCHC: 33.2 g/dL (ref 30.0–36.0)
MCV: 95.5 fL (ref 80.0–100.0)
Platelets: 181 10*3/uL (ref 150–400)
RBC: 4.23 MIL/uL (ref 3.87–5.11)
RDW: 13 % (ref 11.5–15.5)
WBC: 8.9 10*3/uL (ref 4.0–10.5)
nRBC: 0 % (ref 0.0–0.2)

## 2020-05-01 LAB — MAGNESIUM: Magnesium: 1.9 mg/dL (ref 1.7–2.4)

## 2020-05-01 LAB — BRAIN NATRIURETIC PEPTIDE: B Natriuretic Peptide: 234.7 pg/mL — ABNORMAL HIGH (ref 0.0–100.0)

## 2020-05-01 MED ORDER — CHLORHEXIDINE GLUCONATE CLOTH 2 % EX PADS
6.0000 | MEDICATED_PAD | Freq: Every day | CUTANEOUS | Status: DC
  Administered 2020-05-01 – 2020-05-02 (×2): 6 via TOPICAL

## 2020-05-01 MED ORDER — FUROSEMIDE 10 MG/ML IJ SOLN
20.0000 mg | Freq: Once | INTRAMUSCULAR | Status: AC
  Administered 2020-05-01: 20 mg via INTRAVENOUS
  Filled 2020-05-01: qty 4

## 2020-05-01 MED ORDER — HALOPERIDOL LACTATE 5 MG/ML IJ SOLN
2.5000 mg | Freq: Once | INTRAMUSCULAR | Status: AC
  Administered 2020-05-01: 2.5 mg via INTRAVENOUS
  Filled 2020-05-01: qty 1

## 2020-05-01 MED ORDER — FUROSEMIDE 10 MG/ML IJ SOLN
40.0000 mg | Freq: Two times a day (BID) | INTRAMUSCULAR | Status: DC
  Administered 2020-05-01 – 2020-05-02 (×2): 40 mg via INTRAVENOUS
  Filled 2020-05-01 (×2): qty 4

## 2020-05-01 NOTE — ED Notes (Signed)
Lab reported hemolyzed lab samples. This RN requested lab to come stick patient for blood samples.

## 2020-05-01 NOTE — ED Notes (Addendum)
Patient more alert and able to speak in full sentences at this time. Patient's daughter reports patient is almost back to baseline mentation. Pt has hx of dementia and does "sun down" at night. Alert and oriented to person, time, and place.   Patient tolerating 4L O2 well.  Patient states she is appreciative of removing the mask, "because it was hurting my nose".

## 2020-05-01 NOTE — ED Notes (Signed)
Pt refusing to keep BP cuff on arm. Unable to obtain BP for VS as needed per pt acuity.

## 2020-05-01 NOTE — Progress Notes (Addendum)
PROGRESS NOTE    Ashley Hill  QTM:[id number] DOB: 1934-11-14 DOA: 04/30/2020 PCP: Marguarite Arbour, MD    Assessment & Plan:   Principal Problem:   Acute CHF (congestive heart failure) (HCC) Active Problems:   Hypertension   Hyperlipidemia   Dementia in Alzheimer's disease (HCC)   Alcohol abuse   Atrial fibrillation, chronic (HCC)   TIA (transient ischemic attack)   Acute respiratory failure (HCC)   Acute CHF: unknown systolic vs diastolic vs combined. Continue on IV lasix. Monitor I/Os & daily weights. Fluid restriction. Cardio consulted   HTN: continue on home dose of coreg, clonidine, cardizem  HLD: continue on statin   Alzheimer's disease: continue w/ supportive care  Chronic a. fib: continue on home dose of coreg, cardizem, & eliquis   TIA: continue on statin & eliquis  Alcohol abuse: continue on CIWA protocol    DVT prophylaxis: eliquis Code Status: partial  Family Communication: discussed pt's care w/ pt's daughter, [name], and answered her questions Disposition Plan: depends on PT/OT recs   Status is: Observation  The patient remains OBS appropriate and will d/c before 2 midnights.  Dispo: The patient is from: Home              Anticipated d/c is to: Home              Anticipated d/c date is: 2 days              Patient currently is not medically stable to d/c.        Consultants:   Cardio    Procedures:    Antimicrobials:    Subjective: Pt is oriented to person, place. Pt denies any chest pain. Pt c/o fatigue   Objective: Vitals:   05/01/20 0300 05/01/20 0630 05/01/20 0700 05/01/20 0715  BP: (!) 151/81 (!) 166/87 (!) 167/90   Pulse: 81 75 82 87  Resp: 18 20 (!) 28 16  Temp:  98 F (36.7 C)    TempSrc:  Oral    SpO2: 100% 95% 98% 98%  Weight:      Height:        Intake/Output Summary (Last 24 hours) at 05/01/2020 0800 Last data filed at 05/01/2020 0700 Gross per 24 hour  Intake --  Output 2480 ml  Net -2480 ml    Filed Weights   04/30/20 1258  Weight: 63.5 kg    Examination:  General exam: Appears calm and comfortable  Respiratory system: decreased breath sounds b/l Cardiovascular system: irregularly irregular. No rubs, gallops or clicks.  Gastrointestinal system: Abdomen is nondistended, soft and nontender. Normal bowel sounds heard. Central nervous system: Alert and oriented to person, place. Moves all 4 extremities Psychiatry: Judgement and insight appear abnormal. Mood & affect appropriate.     Data Reviewed: I have personally reviewed following labs and imaging studies  CBC: Recent Labs  Lab 04/30/20 1313 05/01/20 0525  WBC 10.4 8.9  NEUTROABS 8.5*  --   HGB 13.3 13.4  HCT 40.0 40.4  MCV 95.7 95.5  PLT 188 181   Basic Metabolic Panel: Recent Labs  Lab 04/30/20 1313 05/01/20 0659  NA 139 140  K 3.7 3.5  CL 106 101  CO2 21* 29  GLUCOSE 167* 128*  BUN 27* 22  CREATININE 0.98 0.94  CALCIUM 9.2 9.4  MG 1.9 1.9   GFR: Estimated Creatinine Clearance: 42.5 mL/min (by C-G formula based on SCr of 0.94 mg/dL). Liver Function Tests: Recent Labs  Lab 04/30/20 1313  AST 35  ALT 30  ALKPHOS 66  BILITOT 1.1  PROT 6.5  ALBUMIN 3.8   No results for input(s): LIPASE, AMYLASE in the last 168 hours. No results for input(s): AMMONIA in the last 168 hours. Coagulation Profile: No results for input(s): INR, PROTIME in the last 168 hours. Cardiac Enzymes: No results for input(s): CKTOTAL, CKMB, CKMBINDEX, TROPONINI in the last 168 hours. BNP (last 3 results) No results for input(s): PROBNP in the last 8760 hours. HbA1C: No results for input(s): HGBA1C in the last 72 hours. CBG: No results for input(s): GLUCAP in the last 168 hours. Lipid Profile: No results for input(s): CHOL, HDL, LDLCALC, TRIG, CHOLHDL, LDLDIRECT in the last 72 hours. Thyroid Function Tests: No results for input(s): TSH, T4TOTAL, FREET4, T3FREE, THYROIDAB in the last 72 hours. Anemia Panel: No  results for input(s): VITAMINB12, FOLATE, FERRITIN, TIBC, IRON, RETICCTPCT in the last 72 hours. Sepsis Labs: No results for input(s): PROCALCITON, LATICACIDVEN in the last 168 hours.  Recent Results (from the past 240 hour(s))  Resp Panel by RT-PCR (Flu A&B, Covid) Nasopharyngeal Swab     Status: None   Collection Time: 04/30/20  4:27 PM   Specimen: Nasopharyngeal Swab; Nasopharyngeal(NP) swabs in vial transport medium  Result Value Ref Range Status   SARS Coronavirus 2 by RT PCR NEGATIVE NEGATIVE Final    Comment: (NOTE) SARS-CoV-2 target nucleic acids are NOT DETECTED.  The SARS-CoV-2 RNA is generally detectable in upper respiratory specimens during the acute phase of infection. The lowest concentration of SARS-CoV-2 viral copies this assay can detect is 138 copies/mL. A negative result does not preclude SARS-Cov-2 infection and should not be used as the sole basis for treatment or other patient management decisions. A negative result may occur with  improper specimen collection/handling, submission of specimen other than nasopharyngeal swab, presence of viral mutation(s) within the areas targeted by this assay, and inadequate number of viral copies(<138 copies/mL). A negative result must be combined with clinical observations, patient history, and epidemiological information. The expected result is Negative.  Fact Sheet for Patients:  BloggerCourse.com  Fact Sheet for Healthcare Providers:  SeriousBroker.it  This test is no t yet approved or cleared by the [place] FDA and  has been authorized for detection and/or diagnosis of SARS-CoV-2 by FDA under an Emergency Use Authorization (EUA). This EUA will remain  in effect (meaning this test can be used) for the duration of the COVID-19 declaration under Section 564(b)(1) of the Act, 21 U.S.C.section 360bbb-3(b)(1), unless the authorization is terminated  or revoked sooner.        Influenza A by PCR NEGATIVE NEGATIVE Final   Influenza B by PCR NEGATIVE NEGATIVE Final    Comment: (NOTE) The Xpert Xpress SARS-CoV-2/FLU/RSV plus assay is intended as an aid in the diagnosis of influenza from Nasopharyngeal swab specimens and should not be used as a sole basis for treatment. Nasal washings and aspirates are unacceptable for Xpert Xpress SARS-CoV-2/FLU/RSV testing.  Fact Sheet for Patients: BloggerCourse.com  Fact Sheet for Healthcare Providers: SeriousBroker.it  This test is not yet approved or cleared by the [place] FDA and has been authorized for detection and/or diagnosis of SARS-CoV-2 by FDA under an Emergency Use Authorization (EUA). This EUA will remain in effect (meaning this test can be used) for the duration of the COVID-19 declaration under Section 564(b)(1) of the Act, 21 U.S.C. section 360bbb-3(b)(1), unless the authorization is terminated or revoked.  Performed at [facility], [address] [place], [place]  [address]          Radiology Studies: DG Chest 2 View  Result Date: 04/30/2020 CLINICAL DATA:  Shortness of breath. EXAM: CHEST - 2 VIEW COMPARISON:  None. FINDINGS: Mild cardiomegaly is noted. Bilateral pulmonary edema is noted. Small bilateral pleural effusions are noted. Bony thorax is unremarkable. No pneumothorax is seen. IMPRESSION: Bilateral pulmonary edema with small pleural effusions. Aortic Atherosclerosis (ICD10-I70.0). Electronically Signed   By: Lupita Raider M.D.   On: 04/30/2020 13:55   DG Chest Portable 1 View  Result Date: 04/30/2020 CLINICAL DATA:  Shortness of breath, progressive. EXAM: PORTABLE CHEST 1 VIEW COMPARISON:  Radiograph earlier today at 1332 hour FINDINGS: Worsening pulmonary edema. Pleural effusions are similar. Mild cardiomegaly is similar. No pneumothorax or developing airspace disease. Stable osseous structures. IMPRESSION: 1.  Worsening pulmonary edema since exam earlier today. 2. Unchanged pleural effusions and mild cardiomegaly. Electronically Signed   By: Narda Rutherford M.D.   On: 04/30/2020 19:17        Scheduled Meds: . apixaban  5 mg Oral BID  . carvedilol  25 mg Oral BID WC  . cloNIDine  0.1 mg Oral BID  . diltiazem  240 mg Oral Daily  . folic acid  1 mg Oral Daily  . furosemide  40 mg Intravenous Q12H  . multivitamin with minerals  1 tablet Oral Daily  . rosuvastatin  10 mg Oral Daily  . sodium chloride flush  3 mL Intravenous Q12H  . thiamine  100 mg Oral Daily   Or  . thiamine  100 mg Intravenous Daily   Continuous Infusions: . sodium chloride       LOS: 0 days    Time spent: 32 mins     Charise Killian, MD [facility] Pager [phone]  If 7PM-7AM, please contact night-coverage 05/01/2020, 8:00 AM

## 2020-05-01 NOTE — Progress Notes (Signed)
Patient resting comfortably at this time. Has been off bipap for quite some time. bipap not reordered by admitting md. Order discontinued.

## 2020-05-01 NOTE — Plan of Care (Signed)
  Problem: Education: Goal: Knowledge of General Education information will improve Description Including pain rating scale, medication(s)/side effects and non-pharmacologic comfort measures Outcome: Progressing   

## 2020-05-01 NOTE — ED Notes (Signed)
Patient continually attempting to take bipap off. Patient educated as to importance of bipap. Pt states "but I can't breathe". Respiratory called to assess mask and settings.

## 2020-05-01 NOTE — Evaluation (Signed)
Occupational Therapy Evaluation Patient Details Name: Ashley Hill MRN: [id number] DOB: 10-08-1934 Today's Date: 05/01/2020    History of Present Illness 85 yo female with onset of acute CHF and pulm edema/effusion, SOB,  was admitted and referred to rehab.  Pt has 4L O2 needed at baseline, PMHx:  atherosclerosis, a-fib, HTN, HLD, HOH, Alz, PMR, seizures, alcohol abuse, TIA, AV nodal re-entry tachycardia with ablation in 2012.   Clinical Impression   Ms Stauffer was seen for OT/PT co-evaluation this date. Prior to hospital admission, pt was MOD I for mobility and ADLs, per CHL, DTR assists c medications. Pt lives alone c DTR available PRN. Pt presents to acute OT demonstrating impaired ADL performance, functional cognition, and functional mobility 2/2 decreased activity tolerance and poor insight into deficits. Pleasantly confused, states "we [place]", oriented to year/month and self. Disorietned to situation, requires multimodal cues for safety. Pt currently requires SUPERVISION don B socks and shoes seated EOB. SBA + RW for toilet t/f and hand washing standing sink side - VCs for safety. SBA + R hand rail sit<>stand at low commode height. Pt would benefit from skilled OT to address noted impairments and functional limitations (see below for any additional details) in order to maximize safety and independence while minimizing falls risk and caregiver burden. Upon hospital discharge, recommend HHOT c 24/7 hour supervision to maximize pt safety and return to functional independence during meaningful occupations of daily life.     Follow Up Recommendations  Home health OT;Supervision/Assistance - 24 hour    Equipment Recommendations  None recommended by OT    Recommendations for Other Services       Precautions / Restrictions Precautions Precautions: Fall Precaution Comments: monitor O2 sats Restrictions Weight Bearing Restrictions: No Other Position/Activity Restrictions:  4L O2      Mobility Bed Mobility Overal bed mobility: Modified Independent Bed Mobility: Supine to Sit;Sit to Supine     Supine to sit: Supervision Sit to supine: Supervision   General bed mobility comments: supervision for safety    Transfers Overall transfer level: Needs assistance Equipment used: Rolling walker (2 wheeled) Transfers: Sit to/from Stand Sit to Stand: Min guard         General transfer comment: SBA + R hand rail sit<>stand at low commode height    Balance Overall balance assessment: Needs assistance Sitting-balance support: Feet supported Sitting balance-Leahy Scale: Good     Standing balance support: During functional activity;No upper extremity supported Standing balance-Leahy Scale: Fair Standing balance comment: CGA w/o BUE support                           ADL either performed or assessed with clinical judgement   ADL Overall ADL's : Needs assistance/impaired                                       General ADL Comments: SUPERVISION don B socks and shoes seated EOB. SBA + RW for toilet t/f and hand washing standing sink side - VCs for safety                  Pertinent Vitals/Pain Pain Assessment: No/denies pain     Hand Dominance Right   Extremity/Trunk Assessment Upper Extremity Assessment Upper Extremity Assessment: Overall WFL for tasks assessed   Lower Extremity Assessment Lower Extremity Assessment: Overall WFL for tasks  assessed       Communication Communication Communication: No difficulties   Cognition Arousal/Alertness: Awake/alert Behavior During Therapy: WFL for tasks assessed/performed Overall Cognitive Status: No family/caregiver present to determine baseline cognitive functioning                                 General Comments: Pleasantly confused, states "we are somewhere in the [place]", oriented to year/month and self. Disorietned to situation, requires multimodal  cues for safety   General Comments  SpO2 94% on 4L [place] during mobility    Exercises Exercises: Other exercises Other Exercises Other Exercises: Pt educated re: OT role, DME recs, d/c recs, falls prevention, ECS Other Exercises: Toileting, LBD, bed mobility, sup<>sit, sit<>stand x2, ~200 ft mobility, sitting/standing balance/toelrance   Shoulder Instructions      Home Living Family/patient expects to be discharged to:: Private residence Living Arrangements: Alone Available Help at Discharge: Family;Available PRN/intermittently                         Home Equipment: Walker - 2 wheels;Cane - single point   Additional Comments: Per CSW note, pt lives alone c family available PRN      Prior Functioning/Environment Level of Independence: Independent with assistive device(s)        Comments: Per CHL, pt is MOD I for mobility/ADLs using RW        OT Problem List: Decreased activity tolerance;Impaired balance (sitting and/or standing);Decreased safety awareness      OT Treatment/Interventions: Self-care/ADL training;Therapeutic exercise;Energy conservation;DME and/or AE instruction;Therapeutic activities;Patient/family education;Balance training    OT Goals(Current goals can be found in the care plan section) Acute Rehab OT Goals Patient Stated Goal: to go for a walk OT Goal Formulation: With patient Time For Goal Achievement: 05/15/20 Potential to Achieve Goals: Good ADL Goals Pt Will Perform Grooming: with modified independence;standing (c LRAD PRN) Pt Will Perform Lower Body Dressing: with modified independence;sit to/from stand (c LRAD PRN) Pt Will Perform Toileting - Clothing Manipulation and hygiene: with modified independence;sit to/from stand (c LRAD PRN)  OT Frequency: Min 1X/week   Barriers to D/C: Decreased caregiver support          Co-evaluation PT/OT/SLP Co-Evaluation/Treatment: Yes Reason for Co-Treatment: For patient/therapist safety;Necessary  to address cognition/behavior during functional activity PT goals addressed during session: Mobility/safety with mobility OT goals addressed during session: ADL's and self-care      AM-PAC OT "6 Clicks" Daily Activity     Outcome Measure Help from another person eating meals?: None Help from another person taking care of personal grooming?: A Little Help from another person toileting, which includes using toliet, bedpan, or urinal?: A Little Help from another person bathing (including washing, rinsing, drying)?: A Little Help from another person to put on and taking off regular upper body clothing?: None Help from another person to put on and taking off regular lower body clothing?: A Little 6 Click Score: 20   End of Session Equipment Utilized During Treatment: Rolling walker;Oxygen  Activity Tolerance: Patient tolerated treatment well Patient left: in bed;with call bell/phone within reach;with bed alarm set  OT Visit Diagnosis: Other abnormalities of gait and mobility (R26.89)                Time: 9622-2979 OT Time Calculation (min): 17 min Charges:  OT General Charges $OT Visit: 1 Visit OT Evaluation $OT Eval Moderate Complexity: 1 Mod OT Treatments $  Self Care/Home Management : 8-22 mins  Kathie Dike, M.S. OTR/L  05/01/20, 1:55 PM  ascom [phone]

## 2020-05-01 NOTE — ED Notes (Signed)
Pt given water and graham crackers at this time 

## 2020-05-01 NOTE — Evaluation (Signed)
Physical Therapy Evaluation Patient Details Name: Ashley Hill MRN: [id number] DOB: 1935/01/26 Today's Date: 05/01/2020   History of Present Illness  85 yo female with onset of acute CHF and pulm edema/effusion, SOB,  was admitted and referred to rehab.  Pt has 4L O2 needed at baseline, PMHx:  atherosclerosis, a-fib, HTN, HLD, HOH, Alz, PMR, seizures, alcohol abuse, TIA, AV nodal re-entry tachycardia with ablation in 2012.  Clinical Impression  Pt was seen for mobility on RW with OT in attendance given pt's challenges of movement.  Pt is able to walk with RW in supervised way but cognitively is limited for adherence to safety and general awareness (did not realize she had a foley cath).  Will progress her movement on walker and with balance training as tolerated, and will require family or caregiver help to be there 24/7 initially due to cognitive concerns.  Pt is appropriate to go to SNF if this coverage cannot be arranged.    Follow Up Recommendations Home health PT;Supervision/Assistance - 24 hour;Supervision for mobility/OOB    Equipment Recommendations  Rolling walker with 5" wheels    Recommendations for Other Services       Precautions / Restrictions Precautions Precautions: Fall Precaution Comments: monitor O2 sats Restrictions Weight Bearing Restrictions: No Other Position/Activity Restrictions: 4L O2      Mobility  Bed Mobility Overal bed mobility: Needs Assistance Bed Mobility: Supine to Sit;Sit to Supine     Supine to sit: Supervision Sit to supine: Supervision   General bed mobility comments: supervision for safety    Transfers Overall transfer level: Needs assistance Equipment used: Rolling walker (2 wheeled);1 person hand held assist Transfers: Sit to/from Stand Sit to Stand: Min guard            Ambulation/Gait Ambulation/Gait assistance: Min guard Gait Distance (Feet): 140 Feet Assistive device: Rolling walker (2 wheeled);1 person hand held  assist Gait Pattern/deviations: Step-to pattern;Step-through pattern;Decreased stride length;Wide base of support Gait velocity: reduced   General Gait Details: cued her for maintaining space inside walker and safety with turns  Stairs Stairs:  (pt wasn't clear whether she has any)          Wheelchair Mobility    Modified Rankin (Stroke Patients Only)       Balance Overall balance assessment: Needs assistance Sitting-balance support: Feet supported Sitting balance-Leahy Scale: Fair     Standing balance support: Bilateral upper extremity supported;During functional activity Standing balance-Leahy Scale: Poor Standing balance comment: less than fair dynamically                             Pertinent Vitals/Pain Pain Assessment: No/denies pain    Home Living Family/patient expects to be discharged to:: Unsure                 Additional Comments: pt states she lives alone    Prior Function Level of Independence: Independent with assistive device(s)         Comments: pt is home alone per pt and chart, using RW     Hand Dominance        Extremity/Trunk Assessment                Communication   Communication: No difficulties  Cognition Arousal/Alertness: Awake/alert Behavior During Therapy: WFL for tasks assessed/performed Overall Cognitive Status: No family/caregiver present to determine baseline cognitive functioning  General Comments: pt is mildly confused but can name the year.      General Comments General comments (skin integrity, edema, etc.): pt is mildly unsteady on walker, requires supervised help for safety and seems to be less than PLOF if pt truly was living alone.  Will require help at home    Exercises     Assessment/Plan    PT Assessment Patient needs continued PT services  PT Problem List Decreased strength;Decreased activity tolerance;Decreased balance;Decreased  coordination;Decreased cognition;Decreased safety awareness;Cardiopulmonary status limiting activity       PT Treatment Interventions DME instruction;Gait training;Stair training;Functional mobility training;Therapeutic activities;Therapeutic exercise;Balance training;Neuromuscular re-education;Patient/family education    PT Goals (Current goals can be found in the Care Plan section)  Acute Rehab PT Goals Patient Stated Goal: to go for a walk PT Goal Formulation: Patient unable to participate in goal setting Time For Goal Achievement: 05/08/20 Potential to Achieve Goals: Good    Frequency Min 2X/week   Barriers to discharge Decreased caregiver support;Inaccessible home environment has no clear information about geography at home and help available    Co-evaluation PT/OT/SLP Co-Evaluation/Treatment: Yes Reason for Co-Treatment: For patient/therapist safety;To address functional/ADL transfers PT goals addressed during session: Mobility/safety with mobility;Proper use of DME         AM-PAC PT "6 Clicks" Mobility  Outcome Measure Help needed turning from your back to your side while in a flat bed without using bedrails?: None Help needed moving from lying on your back to sitting on the side of a flat bed without using bedrails?: A Little Help needed moving to and from a bed to a chair (including a wheelchair)?: A Little Help needed standing up from a chair using your arms (e.g., wheelchair or bedside chair)?: A Little Help needed to walk in hospital room?: A Little Help needed climbing 3-5 steps with a railing? : A Lot 6 Click Score: 18    End of Session Equipment Utilized During Treatment: Gait belt;Oxygen Activity Tolerance: Patient limited by fatigue;Treatment limited secondary to medical complications (Comment) Patient left: in bed;with call bell/phone within reach;with bed alarm set Nurse Communication: Mobility status PT Visit Diagnosis: Unsteadiness on feet  (R26.81);Muscle weakness (generalized) (M62.81);Other abnormalities of gait and mobility (R26.89)    Time: 1110-1136 PT Time Calculation (min) (ACUTE ONLY): 26 min   Charges:   PT Evaluation $PT Eval Moderate Complexity: 1 Mod         Ivar Drape 05/01/2020, 1:16 PM  Samul Dada, PT MS Acute Rehab Dept. Number: [facility] [phone] and [facility] [phone]

## 2020-05-01 NOTE — Progress Notes (Signed)
Patient Name: Ashley Hill Date of Encounter: 05/01/2020  Hospital Problem List     Principal Problem:   Acute CHF (congestive heart failure) (HCC) Active Problems:   Hypertension   Hyperlipidemia   Dementia in Alzheimer's disease (HCC)   Alcohol abuse   Atrial fibrillation, chronic (HCC)   TIA (transient ischemic attack)   Acute respiratory failure [facility](HCC)    Patient Profile     85 y.o. female with history of paroxysmal atrial fibrillation on anticoagulation, alcohol abuse, history of AV nodal reentry tachycardia [place], history of dementia, peripheral vascular disease, who presented to emergency room today with complaints of shortness of breath.  She recently established care with Dr. Gwen PoundsKowalski.  She is treated with amlodipine 5 mg daily, carvedilol 25 mg twice daily, clonidine 0.1 mg twice daily, Eliquis 5 mg twice daily, and Crestor 10 mg daily.  Chest x-ray showed bilateral pulmonary edema, BNP was 328, function and electrolytes were normal.  Review showed atrial fibrillation with full ventricular response with no ischemia.Very difficult historian. History obatined from her daughter yesterday.   Subjective   Pleasantly confused this am no complaints   Inpatient Medications    . apixaban  5 mg Oral BID  . carvedilol  25 mg Oral BID WC  . Chlorhexidine Gluconate Cloth  6 each Topical Daily  . cloNIDine  0.1 mg Oral BID  . diltiazem  240 mg Oral Daily  . folic acid  1 mg Oral Daily  . furosemide  40 mg Intravenous Q12H  . multivitamin with minerals  1 tablet Oral Daily  . rosuvastatin  10 mg Oral Daily  . sodium chloride flush  3 mL Intravenous Q12H  . thiamine  100 mg Oral Daily   Or  . thiamine  100 mg Intravenous Daily    Vital Signs    Vitals:   05/01/20 0700 05/01/20 0715 05/01/20 0822 05/01/20 1244  BP: (!) 167/90  (!) 151/74 134/66  Pulse: 82 87 94 74  Resp: (!) 28 16 18 20   Temp:   98 F (36.7 C)   TempSrc:   Oral Oral   SpO2: 98% 98% 99% 94%  Weight:      Height:        Intake/Output Summary (Last 24 hours) at 05/01/2020 1437 Last data filed at 05/01/2020 0700 Gross per 24 hour  Intake --  Output 2480 ml  Net -2480 ml   Filed Weights   04/30/20 1258  Weight: 63.5 kg    Physical Exam    GEN: Well nourished, well developed, in no acute distress.  HEENT: normal.  Neck: Supple, no JVD, carotid bruits, or masses. Cardiac: irr, irr Respiratory:  Respirations regular and unlabored, clear to auscultation bilaterally. GI: Soft, nontender, nondistended, BS + x 4. MS: no deformity or atrophy. Skin: warm and dry, no rash. Neuro:  Strength and sensation are intact, somewhat confused. Marland Kitchen. Psych: Normal affect.  Labs    CBC Recent Labs    04/30/20 1313 05/01/20 0525  WBC 10.4 8.9  NEUTROABS 8.5*  --   HGB 13.3 13.4  HCT 40.0 40.4  MCV 95.7 95.5  PLT 188 181   Basic Metabolic Panel Recent Labs    44/07/4710/17/21 1313 05/01/20 0659  NA 139 140  K 3.7 3.5  CL 106 101  CO2 21* 29  GLUCOSE 167* 128*  BUN 27* 22  CREATININE 0.98 0.94  CALCIUM 9.2 9.4  MG 1.9 1.9   Liver  Function Tests Recent Labs    04/30/20 1313  AST 35  ALT 30  ALKPHOS 66  BILITOT 1.1  PROT 6.5  ALBUMIN 3.8   No results for input(s): LIPASE, AMYLASE in the last 72 hours. Cardiac Enzymes No results for input(s): CKTOTAL, CKMB, CKMBINDEX, TROPONINI in the last 72 hours. BNP Recent Labs    04/30/20 1313 05/01/20 0659  BNP 328.8* 234.7*   D-Dimer No results for input(s): DDIMER in the last 72 hours. Hemoglobin A1C No results for input(s): HGBA1C in the last 72 hours. Fasting Lipid Panel No results for input(s): CHOL, HDL, LDLCALC, TRIG, CHOLHDL, LDLDIRECT in the last 72 hours. Thyroid Function Tests No results for input(s): TSH, T4TOTAL, T3FREE, THYROIDAB in the last 72 hours.  Invalid input(s): FREET3  Telemetry    afib with variable vr  ECG    afib with rvr  Radiology    DG Chest 2  View  Result Date: 04/30/2020 CLINICAL DATA:  Shortness of breath. EXAM: CHEST - 2 VIEW COMPARISON:  None. FINDINGS: Mild cardiomegaly is noted. Bilateral pulmonary edema is noted. Small bilateral pleural effusions are noted. Bony thorax is unremarkable. No pneumothorax is seen. IMPRESSION: Bilateral pulmonary edema with small pleural effusions. Aortic Atherosclerosis (ICD10-I70.0). Electronically Signed   By: Lupita Raider M.D.   On: 04/30/2020 13:55   DG Chest Portable 1 View  Result Date: 04/30/2020 CLINICAL DATA:  Shortness of breath, progressive. EXAM: PORTABLE CHEST 1 VIEW COMPARISON:  Radiograph earlier today at 1332 hour FINDINGS: Worsening pulmonary edema. Pleural effusions are similar. Mild cardiomegaly is similar. No pneumothorax or developing airspace disease. Stable osseous structures. IMPRESSION: 1. Worsening pulmonary edema since exam earlier today. 2. Unchanged pleural effusions and mild cardiomegaly. Electronically Signed   By: Narda Rutherford M.D.   On: 04/30/2020 19:17   ECHOCARDIOGRAM COMPLETE  Result Date: 05/01/2020    ECHOCARDIOGRAM REPORT   Patient Name:   Ashley Hill Pinard Date of Exam: 04/30/2020 Medical Rec #:  [id number]   Height:       66.0 in Accession #:    [id number]  Weight:       140.0 lb Date of Birth:  1935/03/24   BSA:          1.719 m Patient Age:    85 years    BP:           189/80 mmHg Patient Gender: F           HR:           84 bpm. Exam Location:  [facility] Procedure: 2D Echo Indications:     CHF-Acute Diastolic I50.31  History:         Patient has no prior history of Echocardiogram examinations.                  Risk Factors:Hypertension and Dyslipidemia.  Sonographer:     Johnathan Hausen Referring Phys:  Wynona Neat NIU Diagnosing Phys: Harold Hedge MD IMPRESSIONS  1. Left ventricular ejection fraction, by estimation, is 65 to 70%. The left ventricle has normal function. The left ventricle has no regional wall motion abnormalities. Left ventricular diastolic parameters  were normal.  2. Right ventricular systolic function is normal. The right ventricular size is normal.  3. Left atrial size was mildly dilated.  4. The mitral valve is grossly normal. Mild to moderate mitral valve regurgitation.  5. The aortic valve is grossly normal. Aortic valve regurgitation is trivial. FINDINGS  Left Ventricle: Left ventricular ejection fraction,  by estimation, is 65 to 70%. The left ventricle has normal function. The left ventricle has no regional wall motion abnormalities. The left ventricular internal cavity size was normal in size. There is  no left ventricular hypertrophy. Left ventricular diastolic parameters were normal. Right Ventricle: The right ventricular size is normal. No increase in right ventricular wall thickness. Right ventricular systolic function is normal. Left Atrium: Left atrial size was mildly dilated. Right Atrium: Right atrial size was normal in size. Pericardium: There is no evidence of pericardial effusion. Mitral Valve: The mitral valve is grossly normal. Mild to moderate mitral valve regurgitation. Tricuspid Valve: The tricuspid valve is grossly normal. Tricuspid valve regurgitation is trivial. Aortic Valve: The aortic valve is grossly normal. Aortic valve regurgitation is trivial. Aortic valve mean gradient measures 4.0 mmHg. Aortic valve peak gradient measures 5.7 mmHg. Pulmonic Valve: The pulmonic valve was not well visualized. Pulmonic valve regurgitation is trivial. Aorta: The aortic root is normal in size and structure. IAS/Shunts: The interatrial septum was not well visualized.  LEFT VENTRICLE PLAX 2D LVIDd:         4.19 cm LVIDs:         1.81 cm LV PW:         1.14 cm LV IVS:        1.29 cm LVOT diam:     2.10 cm LVOT Area:     3.46 cm  IVC IVC diam: 2.62 cm LEFT ATRIUM             Index LA diam:        4.90 cm 2.85 cm/m LA Vol (A2C):   79.5 ml 46.26 ml/m LA Vol (A4C):   88.7 ml 51.61 ml/m LA Biplane Vol: 85.4 ml 49.69 ml/m  AORTIC VALVE AV Vmax:       119.00 cm/s AV Vmean:     95.400 cm/s AV VTI:       0.251 m AV Peak Grad: 5.7 mmHg AV Mean Grad: 4.0 mmHg  AORTA Ao Root diam: 2.90 cm MR Peak grad: 172.1 mmHg  TRICUSPID VALVE MR Mean grad: 118.0 mmHg  TR Peak grad:   79.6 mmHg MR Vmax:      656.00 cm/s TR Vmax:        446.00 cm/s MR Vmean:     521.0 cm/s                           SHUNTS                           Systemic Diam: 2.10 cm Harold Hedge MD Electronically signed by Harold Hedge MD Signature Date/Time: 05/01/2020/12:02:06 PM    Final     Assessment & Plan    85 y.o.femalewith history ofparoxysmal atrial fibrillation on anticoagulation, alcohol abuse, 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 She recently established care with Dr. Gwen Pounds. She is treated with amlodipine 5 mg daily,carvedilol 25 mg twice daily, clonidine 0.1 mg twice daily, Eliquis 5 mg twice daily, and Crestor 10 mg daily. Chest x-ray showed bilateral pulmonary edema, BNP was 328, function and electrolytes were normal. Review showed atrial fibrillation with full ventricular response with no ischemia.  CHF-Etiology of the volume overload unclear. Not on diuretic at home. EF unknown. Echo showed normal lv  function with ef of 55-60%. Left atrial enlargement. Will continue with iv diuresis and evaluate lv function.BNP mildly elevated at 328. Will continue to carefully diurese and follow creatinine and bnp.     afib-on carvedilol, diltiazem which was recently started.rate is controlled at present.  Continue with apixaban.    Signed, Darlin Priestly Twisha Vanpelt MD 05/01/2020, 2:37 PM  Pager: [phone]

## 2020-05-01 NOTE — ED Notes (Signed)
Patient placed back on bipap after removing mask.

## 2020-05-01 NOTE — ED Notes (Signed)
Patient removed from bipap and placed on 4L O2 per Cliffton Asters, NP.

## 2020-05-02 DIAGNOSIS — G309 Alzheimer's disease, unspecified: Secondary | ICD-10-CM | POA: Diagnosis not present

## 2020-05-02 DIAGNOSIS — I5031 Acute diastolic (congestive) heart failure: Secondary | ICD-10-CM

## 2020-05-02 DIAGNOSIS — I110 Hypertensive heart disease with heart failure: Secondary | ICD-10-CM | POA: Diagnosis not present

## 2020-05-02 DIAGNOSIS — I482 Chronic atrial fibrillation, unspecified: Secondary | ICD-10-CM | POA: Diagnosis not present

## 2020-05-02 DIAGNOSIS — F0280 Dementia in other diseases classified elsewhere without behavioral disturbance: Secondary | ICD-10-CM | POA: Diagnosis not present

## 2020-05-02 LAB — CBC
HCT: 40.4 % (ref 36.0–46.0)
Hemoglobin: 13.8 g/dL (ref 12.0–15.0)
MCH: 32 pg (ref 26.0–34.0)
MCHC: 34.2 g/dL (ref 30.0–36.0)
MCV: 93.7 fL (ref 80.0–100.0)
Platelets: 206 10*3/uL (ref 150–400)
RBC: 4.31 MIL/uL (ref 3.87–5.11)
RDW: 12.7 % (ref 11.5–15.5)
WBC: 9 10*3/uL (ref 4.0–10.5)
nRBC: 0 % (ref 0.0–0.2)

## 2020-05-02 LAB — BASIC METABOLIC PANEL
Anion gap: 12 (ref 5–15)
Anion gap: 14 (ref 5–15)
BUN: 26 mg/dL — ABNORMAL HIGH (ref 8–23)
BUN: 27 mg/dL — ABNORMAL HIGH (ref 8–23)
CO2: 27 mmol/L (ref 22–32)
CO2: 28 mmol/L (ref 22–32)
Calcium: 9.1 mg/dL (ref 8.9–10.3)
Calcium: 9.5 mg/dL (ref 8.9–10.3)
Chloride: 100 mmol/L (ref 98–111)
Chloride: 97 mmol/L — ABNORMAL LOW (ref 98–111)
Creatinine, Ser: 0.84 mg/dL (ref 0.44–1.00)
Creatinine, Ser: 0.97 mg/dL (ref 0.44–1.00)
GFR, Estimated: >60 mL/min (ref >60)
GFR, Estimated: 58 mL/min — ABNORMAL LOW (ref >60)
Glucose, Bld: 117 mg/dL — ABNORMAL HIGH (ref 70–99)
Glucose, Bld: 137 mg/dL — ABNORMAL HIGH (ref 70–99)
Potassium: 2.9 mmol/L — ABNORMAL LOW (ref 3.5–5.1)
Potassium: 3.7 mmol/L (ref 3.5–5.1)
Sodium: 139 mmol/L (ref 135–145)
Sodium: 139 mmol/L (ref 135–145)

## 2020-05-02 MED ORDER — FUROSEMIDE 40 MG PO TABS
40.0000 mg | ORAL_TABLET | Freq: Every day | ORAL | Status: DC
  Administered 2020-05-02: 40 mg via ORAL
  Filled 2020-05-02: qty 1

## 2020-05-02 MED ORDER — FUROSEMIDE 40 MG PO TABS: 40.0000 mg | ORAL_TABLET | Freq: Every day | ORAL | 0 refills | Status: AC

## 2020-05-02 MED ORDER — POTASSIUM CHLORIDE CRYS ER 20 MEQ PO TBCR
40.0000 meq | EXTENDED_RELEASE_TABLET | Freq: Two times a day (BID) | ORAL | Status: DC
  Administered 2020-05-02: 40 meq via ORAL
  Filled 2020-05-02: qty 2

## 2020-05-02 MED ORDER — POTASSIUM CHLORIDE ER 10 MEQ PO TBCR: 20.0000 meq | EXTENDED_RELEASE_TABLET | Freq: Every day | ORAL | 0 refills | Status: AC

## 2020-05-02 NOTE — Discharge Summary (Signed)
Physician Discharge Summary  Ashley Hill VXB:[id number] DOB: 1934-09-14 DOA: 04/30/2020  PCP: [name], MD  Admit date: 04/30/2020 Discharge date: 05/02/2020  Admitted From: home  Disposition:  Home w/ home health   Recommendations for Outpatient Follow-up:  1. Follow up with PCP in 1-2 weeks 2. F/u cardio, Dr. [name], in 1 week   Home Health: yes Equipment/Devices:   Discharge Condition: stable  CODE STATUS: partial  Diet recommendation: Heart Healthy   Brief/Interim Summary: HPI was taken from Dr. Blaine Hamper: Ashley Hill is a 85 y.o. 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  Per her daughter, patient developed shortness of breath since last night, which has been progressively worsening.  Patient has mild cough, no fever or chills.  Patient has chest heaviness, but no chest pain.  Patient has nausea and vomited last night, currently no active nausea, vomiting, diarrhea or abdominal pain.  Patient denies symptoms of UTI.  She moves all extremities normally.Pt also c/o right shoulder pain radiating into the neck. She state that she had hx of fracture many years ago.   Pt was 90% RA at home, placed on 2L with EMS, currently 93 to 94% on room air. Pt initially has soft bp of 94/56, currently 123/73 in ED.   Per her daughter, in case covid19 test comes back positive, patient should not be given remdesivir and Covid vaccine, but patient can be treated with steroid and baricitinib.  ED Course: pt was found to have BNP 328, troponin level 7, pending COVID-19 PCR, WBC 10.4, temperature normal, blood pressure 123/73, heart rate 74, RR 24, chest x-ray showed pulmonary edema bilaterally with a small pleural effusion.  Patient is placed on progressive bed of  observation, Dr. Ubaldo Glassing of cardiology is consulted.   Hospital Course from Dr. Jimmye Norman 12/18-12/19/21: Pt presented w/ shortness of breath secondary to fluid/volume overload & CHF. Echo showed EF 55-60%, no wall motion abnormalities. Pt improved w/ lasix and diuresed approx 4.4 L as per cardio. Pt was d/c home w/ coreg, diltiazem, eliquis, lasix, potassium & statin as per cardio. Pt should f/u w/ Dr. [name] as an outpatient in 1 week and pt's daughter verbalized her understand. PT/OT saw the pt and recommended home health. Home health was set up prior to d/c.   Discharge Diagnoses:  Principal Problem:   Acute CHF (congestive heart failure) (HCC) Active Problems:   Hypertension   Hyperlipidemia   Dementia in Alzheimer's disease ([place])   Alcohol abuse   Atrial fibrillation, chronic (HCC)   TIA (transient ischemic attack)   Acute respiratory failure (HCC) Possible acute diastolic CHF: echo shows EF of 65-70%, no regional wall motion abnormalities. Continue on lasix. Monitor I/Os & daily weights. Fluid restriction. Cardio following and recs apprec  HTN: continue on home dose of coreg, clonidine, cardizem  HLD: continue on statin   Alzheimer's disease: continue w/ supportive care  Chronic a. fib: continue on home dose of coreg, cardizem, & eliquis   Hypokalemia: potassium ordered. Will continue to follow   TIA: continue on statin & eliquis  Alcohol abuse: continue on CIWA protocol    Discharge Instructions  Discharge Instructions    Diet - low sodium heart healthy   Complete by: As directed    Discharge instructions   Complete by: As directed  F/u w/ cardio, Dr. [name], in 1 week. F/u w/ PCP in 1-2 weeks   Increase activity slowly   Complete by: As directed      Allergies as of 05/02/2020      Reactions   Penicillins Anaphylaxis, Other (See Comments)   Did it involve swelling of the face/tongue/throat, SOB, or low BP? Yes Did it involve sudden or severe  rash/hives, skin peeling, or any reaction on the inside of your mouth or nose? No Did you need to seek medical attention at a hospital or doctor's office? Yes When did it last happen?50 + years If all above answers are "NO", may proceed with cephalosporin use.   Lipitor [atorvastatin] Other (See Comments)   Muscle cramps in legs      Medication List    STOP taking these medications   amLODipine 5 MG tablet Commonly known as: NORVASC     TAKE these medications   acetaminophen 500 MG tablet Commonly known as: TYLENOL Take 500 mg by mouth every 6 (six) hours as needed for moderate pain or headache.   Alpha-Lipoic Acid 600 MG Caps Take 600 mg by mouth daily.   B-12 Compliance Injection 1000 MCG/ML Kit Generic drug: Cyanocobalamin Inject 1,000 mcg as directed every 30 (thirty) days.   calcium carbonate 1500 (600 Ca) MG Tabs tablet Commonly known as: OSCAL Take 600 mg of elemental calcium by mouth in the morning and at bedtime.   carvedilol 25 MG tablet Commonly known as: COREG Take 25 mg by mouth 2 (two) times daily with a meal.   cloNIDine 0.1 MG tablet Commonly known as: CATAPRES Take 0.1 mg by mouth 2 (two) times daily.   CoQ10 50 MG Caps Take 50 mg by mouth daily.   D3-1000 25 MCG (1000 UT) capsule Generic drug: Cholecalciferol Take 1,000 Units by mouth daily.   diltiazem 240 MG 24 hr capsule Commonly known as: CARDIZEM CD Take 240 mg by mouth daily.   donepezil 10 MG tablet Commonly known as: ARICEPT Take 10 mg by mouth at bedtime.   Eliquis 5 MG Tabs tablet Generic drug: apixaban Take 1 tablet by mouth twice a day What changed: how much to take   furosemide 40 MG tablet Commonly known as: LASIX Take 1 tablet (40 mg total) by mouth daily. Start taking on: May 03, 2020   lidocaine 5 % Commonly known as: Lidoderm Place 1 patch onto the skin daily. Remove & Discard patch within 12 hours or as directed by MD   potassium chloride 10 MEQ  tablet Commonly known as: KLOR-CON Take 2 tablets (20 mEq total) by mouth daily.   rosuvastatin 10 MG tablet Commonly known as: CRESTOR Take 10 mg by mouth daily.   TURMERIC PO Take 125 mg by mouth daily.   ZINC-VITAMIN C PO Take by mouth.       Follow-up Information    [facility] Follow up on 05/13/2020.   Specialty: Cardiology Why: at 9:30am. Enter through the [place] entrance Contact information: [place] [phone]       Corey Skains, MD Follow up in 1 week(s).   Specialty: Cardiology Contact information: [facility] [place] [address] [phone]              Allergies  Allergen Reactions  . Penicillins Anaphylaxis and Other (See Comments)    Did it involve swelling of the face/tongue/throat, SOB,  or low BP? Yes Did it involve sudden or severe rash/hives, skin peeling, or any reaction on the inside of your mouth or nose? No Did you need to seek medical attention at a hospital or doctor's office? Yes When did it last happen?50 + years If all above answers are "NO", may proceed with cephalosporin use.    . Lipitor [Atorvastatin] Other (See Comments)    Muscle cramps in legs    Consultations:  Cardio, Dr. Ubaldo Glassing    Procedures/Studies: DG Chest 2 View  Result Date: 04/30/2020 CLINICAL DATA:  Shortness of breath. EXAM: CHEST - 2 VIEW COMPARISON:  None. FINDINGS: Mild cardiomegaly is noted. Bilateral pulmonary edema is noted. Small bilateral pleural effusions are noted. Bony thorax is unremarkable. No pneumothorax is seen. IMPRESSION: Bilateral pulmonary edema with small pleural effusions. Aortic Atherosclerosis (ICD10-I70.0). Electronically Signed   By: Marijo Conception M.D.   On: 04/30/2020 13:55   DG Chest Portable 1 View  Result Date: 04/30/2020 CLINICAL DATA:  Shortness of breath,  progressive. EXAM: PORTABLE CHEST 1 VIEW COMPARISON:  Radiograph earlier today at 55 hour FINDINGS: Worsening pulmonary edema. Pleural effusions are similar. Mild cardiomegaly is similar. No pneumothorax or developing airspace disease. Stable osseous structures. IMPRESSION: 1. Worsening pulmonary edema since exam earlier today. 2. Unchanged pleural effusions and mild cardiomegaly. Electronically Signed   By: Keith Rake M.D.   On: 04/30/2020 19:17   ECHOCARDIOGRAM COMPLETE  Result Date: 05/01/2020    ECHOCARDIOGRAM REPORT   Patient Name:   Ashley Hill Date of Exam: 04/30/2020 Medical Rec #:  [id number]   Height:       66.0 in Accession #:    [id number]  Weight:       140.0 lb Date of Birth:  [birth year]   BSA:          1.719 m Patient Age:    42 years    BP:           189/80 mmHg Patient Gender: F           HR:           84 bpm. Exam Location:  [facility] Procedure: 2D Echo Indications:     CHF-Acute Diastolic E67.54  History:         Patient has no prior history of Echocardiogram examinations.                  Risk Factors:Hypertension and Dyslipidemia.  Sonographer:     Avanell Shackleton Referring Phys:  Unknown Foley NIU Diagnosing Phys: Bartholome Bill MD IMPRESSIONS  1. Left ventricular ejection fraction, by estimation, is 65 to 70%. The left ventricle has normal function. The left ventricle has no regional wall motion abnormalities. Left ventricular diastolic parameters were normal.  2. Right ventricular systolic function is normal. The right ventricular size is normal.  3. Left atrial size was mildly dilated.  4. The mitral valve is grossly normal. Mild to moderate mitral valve regurgitation.  5. The aortic valve is grossly normal. Aortic valve regurgitation is trivial. FINDINGS  Left Ventricle: Left ventricular ejection fraction, by estimation, is 65 to 70%. The left ventricle has normal function. The left ventricle has no regional wall motion abnormalities. The left ventricular internal cavity size was normal in  size. There is  no left ventricular hypertrophy. Left ventricular diastolic parameters were normal. Right Ventricle: The right ventricular size is normal. No increase in right ventricular wall thickness. Right ventricular systolic function is normal. Left Atrium: Left atrial  size was mildly dilated. Right Atrium: Right atrial size was normal in size. Pericardium: There is no evidence of pericardial effusion. Mitral Valve: The mitral valve is grossly normal. Mild to moderate mitral valve regurgitation. Tricuspid Valve: The tricuspid valve is grossly normal. Tricuspid valve regurgitation is trivial. Aortic Valve: The aortic valve is grossly normal. Aortic valve regurgitation is trivial. Aortic valve mean gradient measures 4.0 mmHg. Aortic valve peak gradient measures 5.7 mmHg. Pulmonic Valve: The pulmonic valve was not well visualized. Pulmonic valve regurgitation is trivial. Aorta: The aortic root is normal in size and structure. IAS/Shunts: The interatrial septum was not well visualized.  LEFT VENTRICLE PLAX 2D LVIDd:         4.19 cm LVIDs:         1.81 cm LV PW:         1.14 cm LV IVS:        1.29 cm LVOT diam:     2.10 cm LVOT Area:     3.46 cm  IVC IVC diam: 2.62 cm LEFT ATRIUM             Index LA diam:        4.90 cm 2.85 cm/m LA Vol (A2C):   79.5 ml 46.26 ml/m LA Vol (A4C):   88.7 ml 51.61 ml/m LA Biplane Vol: 85.4 ml 49.69 ml/m  AORTIC VALVE AV Vmax:      119.00 cm/s AV Vmean:     95.400 cm/s AV VTI:       0.251 m AV Peak Grad: 5.7 mmHg AV Mean Grad: 4.0 mmHg  AORTA Ao Root diam: 2.90 cm MR Peak grad: 172.1 mmHg  TRICUSPID VALVE MR Mean grad: 118.0 mmHg  TR Peak grad:   79.6 mmHg MR Vmax:      656.00 cm/s TR Vmax:        446.00 cm/s MR Vmean:     521.0 cm/s                           SHUNTS                           Systemic Diam: 2.10 cm Bartholome Bill MD Electronically signed by Bartholome Bill MD Signature Date/Time: 05/01/2020/12:02:06 PM    Final        Subjective: pt c/o fatigue    Discharge  Exam: Vitals:   05/02/20 0804 05/02/20 1236  BP: 115/76 134/71  Pulse: 76 90  Resp:    Temp: 97.7 F (36.5 C)   SpO2: 94% 96%   Vitals:   05/02/20 0501 05/02/20 0535 05/02/20 0804 05/02/20 1236  BP: 129/63 126/71 115/76 134/71  Pulse: 66 70 76 90  Resp: 18     Temp:  98 F (36.7 C) 97.7 F (36.5 C)   TempSrc:  Oral Oral   SpO2: 91% 90% 94% 96%  Weight:      Height:        General: Pt is alert, awake, not in acute distress Cardiovascular: irregularly irregular, no rubs, no gallops Respiratory: CTA bilaterally, no wheezing, no rhonchi Abdominal: Soft, NT, ND, bowel sounds + Extremities: no cyanosis    The results of significant diagnostics from this hospitalization (including imaging, microbiology, ancillary and laboratory) are listed below for reference.     Microbiology: Recent Results (from the past 240 hour(s))  Resp Panel by RT-PCR (Flu A&B, Covid) Nasopharyngeal Swab  Status: None   Collection Time: 04/30/20  4:27 PM   Specimen: Nasopharyngeal Swab; Nasopharyngeal(NP) swabs in vial transport medium  Result Value Ref Range Status   SARS Coronavirus 2 by RT PCR NEGATIVE NEGATIVE Final    Comment: (NOTE) SARS-CoV-2 target nucleic acids are NOT DETECTED.  The SARS-CoV-2 RNA is generally detectable in upper respiratory specimens during the acute phase of infection. The lowest concentration of SARS-CoV-2 viral copies this assay can detect is 138 copies/mL. A negative result does not preclude SARS-Cov-2 infection and should not be used as the sole basis for treatment or other patient management decisions. A negative result may occur with  improper specimen collection/handling, submission of specimen other than nasopharyngeal swab, presence of viral mutation(s) within the areas targeted by this assay, and inadequate number of viral copies(<138 copies/mL). A negative result must be combined with clinical observations, patient history, and  epidemiological information. The expected result is Negative.  Fact Sheet for Patients:  EntrepreneurPulse.com.au  Fact Sheet for Healthcare Providers:  IncredibleEmployment.be  This test is no t yet approved or cleared by the [place] FDA and  has been authorized for detection and/or diagnosis of SARS-CoV-2 by FDA under an Emergency Use Authorization (EUA). This EUA will remain  in effect (meaning this test can be used) for the duration of the COVID-19 declaration under Section 564(b)(1) of the Act, 21 U.S.C.section 360bbb-3(b)(1), unless the authorization is terminated  or revoked sooner.       Influenza A by PCR NEGATIVE NEGATIVE Final   Influenza B by PCR NEGATIVE NEGATIVE Final    Comment: (NOTE) The Xpert Xpress SARS-CoV-2/FLU/RSV plus assay is intended as an aid in the diagnosis of influenza from Nasopharyngeal swab specimens and should not be used as a sole basis for treatment. Nasal washings and aspirates are unacceptable for Xpert Xpress SARS-CoV-2/FLU/RSV testing.  Fact Sheet for Patients: EntrepreneurPulse.com.au  Fact Sheet for Healthcare Providers: IncredibleEmployment.be  This test is not yet approved or cleared by the [place] FDA and has been authorized for detection and/or diagnosis of SARS-CoV-2 by FDA under an Emergency Use Authorization (EUA). This EUA will remain in effect (meaning this test can be used) for the duration of the COVID-19 declaration under Section 564(b)(1) of the Act, 21 U.S.C. section 360bbb-3(b)(1), unless the authorization is terminated or revoked.  Performed at [facility], [address] [place], [place] [address]      Labs: BNP (last 3 results) Recent Labs    04/30/20 1313 05/01/20 0659  BNP 328.8* 564.3*   Basic Metabolic Panel: Recent Labs  Lab 04/30/20 1313 05/01/20 0659 05/02/20 0449 05/02/20 1134  NA 139 140 139 139   K 3.7 3.5 2.9* 3.7  CL 106 101 100 97*  CO2 21* 29 27 28   GLUCOSE 167* 128* 117* 137*  BUN 27* 22 26* 27*  CREATININE 0.98 0.94 0.84 0.97  CALCIUM 9.2 9.4 9.1 9.5  MG 1.9 1.9  --   --    Liver Function Tests: Recent Labs  Lab 04/30/20 1313  AST 35  ALT 30  ALKPHOS 66  BILITOT 1.1  PROT 6.5  ALBUMIN 3.8   No results for input(s): LIPASE, AMYLASE in the last 168 hours. No results for input(s): AMMONIA in the last 168 hours. CBC: Recent Labs  Lab 04/30/20 1313 05/01/20 0525 05/02/20 0449  WBC 10.4 8.9 9.0  NEUTROABS 8.5*  --   --   HGB 13.3 13.4 13.8  HCT 40.0 40.4 40.4  MCV 95.7 95.5  93.7  PLT 188 181 206   Cardiac Enzymes: No results for input(s): CKTOTAL, CKMB, CKMBINDEX, TROPONINI in the last 168 hours. BNP: Invalid input(s): POCBNP CBG: No results for input(s): GLUCAP in the last 168 hours. D-Dimer No results for input(s): DDIMER in the last 72 hours. Hgb A1c No results for input(s): HGBA1C in the last 72 hours. Lipid Profile No results for input(s): CHOL, HDL, LDLCALC, TRIG, CHOLHDL, LDLDIRECT in the last 72 hours. Thyroid function studies No results for input(s): TSH, T4TOTAL, T3FREE, THYROIDAB in the last 72 hours.  Invalid input(s): FREET3 Anemia work up No results for input(s): VITAMINB12, FOLATE, FERRITIN, TIBC, IRON, RETICCTPCT in the last 72 hours. Urinalysis    Component Value Date/Time   COLORURINE STRAW (A) 04/30/2020 2300   APPEARANCEUR CLEAR (A) 04/30/2020 2300   LABSPEC 1.008 04/30/2020 2300   PHURINE 5.0 04/30/2020 2300   GLUCOSEU NEGATIVE 04/30/2020 2300   HGBUR MODERATE (A) 04/30/2020 2300   BILIRUBINUR NEGATIVE 04/30/2020 2300   KETONESUR NEGATIVE 04/30/2020 2300   PROTEINUR NEGATIVE 04/30/2020 2300   NITRITE NEGATIVE 04/30/2020 2300   LEUKOCYTESUR NEGATIVE 04/30/2020 2300   Sepsis Labs Invalid input(s): PROCALCITONIN,  WBC,  LACTICIDVEN Microbiology Recent Results (from the past 240 hour(s))  Resp Panel by RT-PCR (Flu A&B,  Covid) Nasopharyngeal Swab     Status: None   Collection Time: 04/30/20  4:27 PM   Specimen: Nasopharyngeal Swab; Nasopharyngeal(NP) swabs in vial transport medium  Result Value Ref Range Status   SARS Coronavirus 2 by RT PCR NEGATIVE NEGATIVE Final    Comment: (NOTE) SARS-CoV-2 target nucleic acids are NOT DETECTED.  The SARS-CoV-2 RNA is generally detectable in upper respiratory specimens during the acute phase of infection. The lowest concentration of SARS-CoV-2 viral copies this assay can detect is 138 copies/mL. A negative result does not preclude SARS-Cov-2 infection and should not be used as the sole basis for treatment or other patient management decisions. A negative result may occur with  improper specimen collection/handling, submission of specimen other than nasopharyngeal swab, presence of viral mutation(s) within the areas targeted by this assay, and inadequate number of viral copies(<138 copies/mL). A negative result must be combined with clinical observations, patient history, and epidemiological information. The expected result is Negative.  Fact Sheet for Patients:  EntrepreneurPulse.com.au  Fact Sheet for Healthcare Providers:  IncredibleEmployment.be  This test is no t yet approved or cleared by the [place] FDA and  has been authorized for detection and/or diagnosis of SARS-CoV-2 by FDA under an Emergency Use Authorization (EUA). This EUA will remain  in effect (meaning this test can be used) for the duration of the COVID-19 declaration under Section 564(b)(1) of the Act, 21 U.S.C.section 360bbb-3(b)(1), unless the authorization is terminated  or revoked sooner.       Influenza A by PCR NEGATIVE NEGATIVE Final   Influenza B by PCR NEGATIVE NEGATIVE Final    Comment: (NOTE) The Xpert Xpress SARS-CoV-2/FLU/RSV plus assay is intended as an aid in the diagnosis of influenza from Nasopharyngeal swab specimens and should  not be used as a sole basis for treatment. Nasal washings and aspirates are unacceptable for Xpert Xpress SARS-CoV-2/FLU/RSV testing.  Fact Sheet for Patients: EntrepreneurPulse.com.au  Fact Sheet for Healthcare Providers: IncredibleEmployment.be  This test is not yet approved or cleared by the [place] FDA and has been authorized for detection and/or diagnosis of SARS-CoV-2 by FDA under an Emergency Use Authorization (EUA). This EUA will remain in effect (meaning this test can be used) for  the duration of the COVID-19 declaration under Section 564(b)(1) of the Act, 21 U.S.C. section 360bbb-3(b)(1), unless the authorization is terminated or revoked.  Performed at [facility], [address], [place], [place] [address]      Time coordinating discharge: Over 30 minutes  SIGNED:   Wyvonnia Dusky, MD  [facility] 05/02/2020, 2:40 PM Pager   If 7PM-7AM, please contact night-coverage

## 2020-05-02 NOTE — Plan of Care (Signed)
  Problem: Education: Goal: Knowledge of General Education information will improve Description: Including pain rating scale, medication(s)/side effects and non-pharmacologic comfort measures Outcome: Adequate for Discharge   

## 2020-05-02 NOTE — TOC Transition Note (Signed)
Transition of Care [facility]) - CM/SW Discharge Note   Patient Details  Name: Ashley Hill MRN: [id number] Date of Birth: [birth year]  [facility] CM/SW Contact:  Luvenia Redden, RN Phone Number:[phone] 05/02/2020, 2:17 PM   Clinical Narrative:    Recommendations for [facility] PT/OT/RN. RN spoke with POA/daughter who requested the same [facility] agency involved previously with pt ([facility]). RN spoke with Barbara Cower with the official referral. Pt will be discharged today transported by her daughter. Further discussed on pt's recent education on CHF with encouragement of daily monitoring and reporting edema/fluid retention of 3 lbs overnight or 5 lbs over one week of fluid to her provider. POA aware to contact pt's provider with all symptoms for possible interventions.   Educated daughter on [facility] services for complex case management services concerning pt's CHF. Daughter receptive and provided information on how to get a referral to [facility] office. This RN case manager has also made a referral request for services. No other inquired or request at this time.  TOC remains available for any other discharge needs.    Final next level of care: Home w Home Health Services Barriers to Discharge: No Barriers Identified   Patient Goals and CMS Choice Patient states their goals for this hospitalization and ongoing recovery are:: Return home with ongoing [facility] ([facility])   Choice offered to / list presented to : NA  Discharge Placement                       Discharge Plan and Services In-house Referral: Clinical Social Work Discharge Planning Services: CM Consult Post Acute Care Choice: Home Health          DME Arranged: N/A DME Agency: NA       HH Arranged: PT,OT,RN HH Agency: [facility] ([facility] Date HH Agency Contacted: 05/02/20 Time HH Agency Contacted: 1400 Representative spoke with at [facility] Agency: Barbara Cower  Social Determinants of Health (SDOH)  Interventions     Readmission Risk Interventions No flowsheet data found.

## 2020-05-02 NOTE — Progress Notes (Signed)
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 She recently established care with Dr. Gwen Pounds. She is treated with amlodipine 5 mg daily,carvedilol 25 mg twice daily, clonidine 0.1 mg twice daily, Eliquis 5 mg twice daily, and Crestor 10 mg daily. Chest x-ray showed bilateral pulmonary edema, BNP was 328, function and electrolytes were normal. Review showed atrial fibrillation with full ventricular response with no ischemia.Very difficult historian.   Subjective   Sitting up in a chair this am. Denies sob, dizziness or chest pain  Inpatient Medications    . apixaban  5 mg Oral BID  . carvedilol  25 mg Oral BID WC  . Chlorhexidine Gluconate Cloth  6 each Topical Daily  . cloNIDine  0.1 mg Oral BID  . diltiazem  240 mg Oral Daily  . folic acid  1 mg Oral Daily  . furosemide  40 mg Intravenous Q12H  . multivitamin with minerals  1 tablet Oral Daily  . potassium chloride  40 mEq Oral BID  . rosuvastatin  10 mg Oral Daily  . sodium chloride flush  3 mL Intravenous Q12H  . thiamine  100 mg Oral Daily   Or  . thiamine  100 mg Intravenous Daily    Vital Signs    Vitals:   05/02/20 0344 05/02/20 0501 05/02/20 0535 05/02/20 0804  BP:  129/63 126/71 115/76  Pulse:  66 70 76  Resp:  18    Temp:   98 F (36.7 C) 97.7 F (36.5 C)  TempSrc:    Oral Oral  SpO2:  91% 90% 94%  Weight: 64.1 kg     Height:        Intake/Output Summary (Last 24 hours) at 05/02/2020 0909 Last data filed at 05/02/2020 0732 Gross per 24 hour  Intake 45 ml  Output 2500 ml  Net -2455 ml   Filed Weights   04/30/20 1258 05/02/20 0344  Weight: 63.5 kg 64.1 kg    Physical Exam    GEN: Well nourished, well developed, in no acute distress.  HEENT: normal.  Neck: Supple, no JVD, carotid bruits, or masses. Cardiac:irr, irr Respiratory:  Respirations regular and unlabored, clear to auscultation bilaterally. GI: Soft, nontender, nondistended, BS + x 4. MS: no deformity or atrophy. Skin: warm and dry, no rash. Neuro:  Strength and sensation are intact.  Psych: Normal affect.  Labs    CBC Recent Labs    04/30/20 1313 05/01/20 0525 05/02/20 0449  WBC 10.4 8.9 9.0  NEUTROABS 8.5*  --   --   HGB 13.3 13.4 13.8  HCT 40.0 40.4 40.4  MCV 95.7 95.5 93.7  PLT 188 181 206   Basic Metabolic Panel Recent Labs    19/41/74 1313 05/01/20 0659 05/02/20 0449  NA 139 140 139  K 3.7 3.5 2.9*  CL 106 101 100  CO2 21* 29 27  GLUCOSE 167*  128* 117*  BUN 27* 22 26*  CREATININE 0.98 0.94 0.84  CALCIUM 9.2 9.4 9.1  MG 1.9 1.9  --    Liver Function Tests Recent Labs    04/30/20 1313  AST 35  ALT 30  ALKPHOS 66  BILITOT 1.1  PROT 6.5  ALBUMIN 3.8   No results for input(s): LIPASE, AMYLASE in the last 72 hours. Cardiac Enzymes No results for input(s): CKTOTAL, CKMB, CKMBINDEX, TROPONINI in the last 72 hours. BNP Recent Labs    04/30/20 1313 05/01/20 0659  BNP 328.8* 234.7*   D-Dimer No results for input(s): DDIMER in the last 72 hours. Hemoglobin A1C No results for input(s): HGBA1C in the last 72 hours. Fasting Lipid Panel No results for input(s): CHOL, HDL, LDLCALC, TRIG, CHOLHDL, LDLDIRECT in the last 72 hours. Thyroid Function Tests No results for input(s): TSH, T4TOTAL, T3FREE, THYROIDAB in the last 72 hours.  Invalid  input(s): FREET3  Telemetry    afib with controlled vr  ECG    afib with rvr  Radiology    DG Chest 2 View  Result Date: 04/30/2020 CLINICAL DATA:  Shortness of breath. EXAM: CHEST - 2 VIEW COMPARISON:  None. FINDINGS: Mild cardiomegaly is noted. Bilateral pulmonary edema is noted. Small bilateral pleural effusions are noted. Bony thorax is unremarkable. No pneumothorax is seen. IMPRESSION: Bilateral pulmonary edema with small pleural effusions. Aortic Atherosclerosis (ICD10-I70.0). Electronically Signed   By: Lupita RaiderJames  Green Jr M.D.   On: 04/30/2020 13:55   DG Chest Portable 1 View  Result Date: 04/30/2020 CLINICAL DATA:  Shortness of breath, progressive. EXAM: PORTABLE CHEST 1 VIEW COMPARISON:  Radiograph earlier today at 1332 hour FINDINGS: Worsening pulmonary edema. Pleural effusions are similar. Mild cardiomegaly is similar. No pneumothorax or developing airspace disease. Stable osseous structures. IMPRESSION: 1. Worsening pulmonary edema since exam earlier today. 2. Unchanged pleural effusions and mild cardiomegaly. Electronically Signed   By: Narda RutherfordMelanie  Sanford M.D.   On: 04/30/2020 19:17   ECHOCARDIOGRAM COMPLETE  Result Date: 05/01/2020    ECHOCARDIOGRAM REPORT   Patient Name:   Ronnette HilaBETTY J Barcenas Date of Exam: 04/30/2020 Medical Rec #:  [id number]   Height:       66.0 in Accession #:    [id number]  Weight:       140.0 lb Date of Birth:  [birth year]   BSA:          1.719 m Patient Age:    85 years    BP:           189/80 mmHg Patient Gender: F           HR:           84 bpm. Exam Location:  [facility] Procedure: 2D Echo Indications:     CHF-Acute Diastolic I50.31  History:         Patient has no prior history of Echocardiogram examinations.                  Risk Factors:Hypertension and Dyslipidemia.  Sonographer:     Johnathan HausenSharika Mucker Referring Phys:  Wynona [id number] XILIN NIU Diagnosing Phys: Harold HedgeKenneth Phill Steck MD IMPRESSIONS  1. Left ventricular ejection fraction, by estimation, is 65 to 70%. The left ventricle has  normal function. The left ventricle has no regional wall motion abnormalities. Left ventricular diastolic parameters were normal.  2. Right ventricular systolic function is normal. The right ventricular size is normal.  3. Left atrial size was mildly dilated.  4. The mitral valve is grossly normal.  Mild to moderate mitral valve regurgitation.  5. The aortic valve is grossly normal. Aortic valve regurgitation is trivial. FINDINGS  Left Ventricle: Left ventricular ejection fraction, by estimation, is 65 to 70%. The left ventricle has normal function. The left ventricle has no regional wall motion abnormalities. The left ventricular internal cavity size was normal in size. There is  no left ventricular hypertrophy. Left ventricular diastolic parameters were normal. Right Ventricle: The right ventricular size is normal. No increase in right ventricular wall thickness. Right ventricular systolic function is normal. Left Atrium: Left atrial size was mildly dilated. Right Atrium: Right atrial size was normal in size. Pericardium: There is no evidence of pericardial effusion. Mitral Valve: The mitral valve is grossly normal. Mild to moderate mitral valve regurgitation. Tricuspid Valve: The tricuspid valve is grossly normal. Tricuspid valve regurgitation is trivial. Aortic Valve: The aortic valve is grossly normal. Aortic valve regurgitation is trivial. Aortic valve mean gradient measures 4.0 mmHg. Aortic valve peak gradient measures 5.7 mmHg. Pulmonic Valve: The pulmonic valve was not well visualized. Pulmonic valve regurgitation is trivial. Aorta: The aortic root is normal in size and structure. IAS/Shunts: The interatrial septum was not well visualized.  LEFT VENTRICLE PLAX 2D LVIDd:         4.19 cm LVIDs:         1.81 cm LV PW:         1.14 cm LV IVS:        1.29 cm LVOT diam:     2.10 cm LVOT Area:     3.46 cm  IVC IVC diam: 2.62 cm LEFT ATRIUM             Index LA diam:        4.90 cm 2.85 cm/m LA Vol (A2C):   79.5  ml 46.26 ml/m LA Vol (A4C):   88.7 ml 51.61 ml/m LA Biplane Vol: 85.4 ml 49.69 ml/m  AORTIC VALVE AV Vmax:      119.00 cm/s AV Vmean:     95.400 cm/s AV VTI:       0.251 m AV Peak Grad: 5.7 mmHg AV Mean Grad: 4.0 mmHg  AORTA Ao Root diam: 2.90 cm MR Peak grad: 172.1 mmHg  TRICUSPID VALVE MR Mean grad: 118.0 mmHg  TR Peak grad:   79.6 mmHg MR Vmax:      656.00 cm/s TR Vmax:        446.00 cm/s MR Vmean:     521.0 cm/s                           SHUNTS                           Systemic Diam: 2.10 cm Harold Hedge MD Electronically signed by Harold Hedge MD Signature Date/Time: 05/01/2020/12:02:06 PM    Final     Assessment & Plan    85 y.o.femalewith history ofparoxysmal atrial fibrillation on anticoagulation, alcohol abuse, 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 She recently established care with Dr. Gwen Pounds. She is treated with amlodipine 5 mg daily,carvedilol 25 mg twice daily, clonidine 0.1 mg twice daily, Eliquis 5 mg twice daily, and Crestor 10 mg daily. Chest x-ray showed bilateral pulmonary edema, BNP was 328, function and electrolytes were normal. EKG on  admission revealed afib with rvr.   CHF-Etiology of the volume overload unclear. Not on diuretic at home. EF unknown. Echo showed normal lv function with ef of 55-60%. Left atrial enlargement. Has diuresed 4.4 liters since admission. Will convert to po lasix at 40 mg daily and agree with  potassium replacement as K was 2.9 this am. . BNP mildly elevated at 328 on admission and has decreased to 234.7 yesterday.     Afib-on carvedilol, diltiazem which was recently started.rate is controlled at present.  Continue with apixaban.  Hypertension-pressure appears atable on low side. Will continue with current regimen as she appears asymptomatic at present.   Appears  stable and euvolemic at present. Will recheck potassium later today. Should be able to be discharged if this improves and she remains stable hemodynamically. Would send home on lasix 40 mg daily, carvedilol 25 mg bid, diltiazem 240 mg daily, apixaban 5 mg bid and rosuvastatin 10 meq daily and potassium 20 meq daily. Will need follow up with Dr. Gwen Pounds in 1 week.   Signed, Darlin Priestly Austine Wiedeman MD 05/02/2020, 9:09 AM  Pager: [phone]

## 2020-05-02 NOTE — Progress Notes (Signed)
Mobility Specialist - Progress Note   05/02/20 1400  Mobility  Activity Ambulated in room  Level of Assistance Modified independent, requires aide device or extra time  Assistive Device None  Distance Ambulated (ft) 25 ft  Mobility Response Tolerated well  Mobility performed by Mobility specialist  $Mobility charge 1 Mobility    Pt was sitting in chair with family present in room upon arrival. Pt agreed to session. Pt denied pain, nausea, or fatigue. Pt did not use AD for session today. Pt ambulated in room with furniture assist. No LOB noted. Pt stated she does have RW at home for use. No c/o SOB. Overall, pt tolerated session well. PT was left in chair with all needs in reach.    Filiberto Pinks Mobility Specialist 05/02/20, 2:57 PM

## 2020-05-04 ENCOUNTER — Other Ambulatory Visit: Payer: Self-pay | Admitting: *Deleted

## 2020-05-04 DIAGNOSIS — I5032 Chronic diastolic (congestive) heart failure: Secondary | ICD-10-CM | POA: Insufficient documentation

## 2020-05-04 DIAGNOSIS — I5033 Acute on chronic diastolic (congestive) heart failure: Secondary | ICD-10-CM | POA: Insufficient documentation

## 2020-05-04 NOTE — Patient Outreach (Signed)
[facility]) Care Management  05/04/2020  Ashley Hill 11-27-1934 [id number]   Telephone Assessment-Unsuccessful New Referral d/c hospital 12/19 [facility] Provider to completed the transition of care    RN attempted outreach however unsuccessful. RN able to leave a HIPAA approved voice message with the POA daughter [place] requesting a call back.   Will  send outreach letter and scheduled another follow up call over the next week for pending services.  [name], RN Care Management Coordinator [facility] Main Office [phone]

## 2020-05-12 ENCOUNTER — Other Ambulatory Visit: Payer: Self-pay | Admitting: *Deleted

## 2020-05-12 NOTE — Patient Outreach (Signed)
[facility]) Care Management  05/12/2020  LYNLEE STRATTON 1935/03/15 [id number]   Telephone Assessment  Outreach #2 RN attempted outreach call today to the POA daughter [name]) due to pt's memory issues however unsuccessful. RN able to leave a HIPAA approved voice message requesting a call back.  Will attempt another outreach over the next week for pending [facility] services.   [name], RN Care Management Coordinator [facility] Main Office [phone]

## 2020-05-12 NOTE — Progress Notes (Signed)
Patient ID: [place], female    DOB: 04-Jul-1934, 85 y.o.   MRN: [id number]  HPI  Ms Ashley Hill is a 85 y/o female with a history of persistent atrial fibrillation, hyperlipidemia, HTN, SVT, PVD, dementia, seizure disorder, previous tobacco use and chronic heart failure.   Echo report from 04/30/20 reviewed and showed an EF of 65-70% along with mild LAE and mild/moderate MR.   Admitted 04/30/20 due to shortness of breath. Cardiology consult obtained. Diuresed ~ 4.4L. Discharged after 2 days.   She presents today for her initial visit with a chief complaint of rhinorrhea since moving from [place] ~ 8 years ago. Mild in nature. She doesn't have any other symptoms and specifically denies any difficulty sleeping, dizziness, abdominal distention, palpitations, pedal edema, chest pain, shortness of breath, cough or fatigue.   Trying to be active and do quite a bit of walking. Reports that her appetite is good but that she eats smaller portions and tends to eat only when she's hungry.   She wasn't sure whether she should take her medication prior to coming to the office today so just took her morning medications while in the exam room.   Past Medical History:  Diagnosis Date  . Arrhythmia    atrial fibrillation  . CHF (congestive heart failure) ([place])   . Dementia ([place])   . Dyslipidemia   . Dysrhythmia   . Ectopic atrial tachycardia ([place])   . Environmental allergies   . H/O scarlet fever   . Heart murmur    IN PAST  . HOH (hard of hearing)   . Hyperlipidemia   . Hypertension   . Peripheral vascular disease ([place])   . Seizure disorder ([place])   . 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 Arnold Hill, [place].  . Transient cerebral ischemia    Past Surgical History:  Procedure Laterality Date  . ABLATION OF DYSRHYTHMIC FOCUS    . AUGMENTATION MAMMAPLASTY Bilateral   . CATARACT EXTRACTION W/PHACO Left 08/28/2017   Procedure: CATARACT  EXTRACTION PHACO AND INTRAOCULAR LENS PLACEMENT (IOC);  Surgeon: Ashley Robson, MD;  Location: [place];  Service: Ophthalmology;  Laterality: Left;  [place] 00:26 AP% 18.4 CDE 4.85 Fluid pa ck lot # [id number]  . CORONARY ANGIOPLASTY    . ENDARTERECTOMY FEMORAL Right 08/07/2016   Procedure: ENDARTERECTOMY FEMORAL;  Surgeon: [place] Huxley, MD;  [place]: [place];  Service: Vascular;  Laterality: Right;  . ERCP N/A 08/07/2019   Procedure: ENDOSCOPIC RETROGRADE CHOLANGIOPANCREATOGRAPHY (ERCP);  Surgeon: [place] Lame, MD;  Location: [place] [facility];  Service: Endoscopy;  Laterality: N/A;  . EYE SURGERY    . JOINT REPLACEMENT     knee  . KNEE ARTHROSCOPY W/ MENISCAL REPAIR     Right   . PERIPHERAL VASCULAR BALLOON ANGIOPLASTY N/A 07/26/2016   Procedure: Peripheral Vascular Balloon Angioplasty;  Surgeon: [place] Huxley, MD;  Location: [place];  Service: Cardiovascular;  Laterality: N/A;  . PERIPHERAL VASCULAR CATHETERIZATION Right 01/11/2015   Procedure: Lower Extremity Angiography;  Surgeon: [place] Huxley, MD;  [place]: [place];  Service: Cardiovascular;  Laterality: Right;  . PERIPHERAL VASCULAR CATHETERIZATION Right 06/21/2015   Procedure: Lower Extremity Angiography;  Surgeon: [place] Huxley, MD;  [facility];  Service: Cardiovascular;  Laterality: Right;  . PERIPHERAL VASCULAR CATHETERIZATION Right 06/21/2015   Procedure: Lower Extremity Intervention;  Surgeon: [place] Huxley, MD;  [place]: [place];  Service: Cardiovascular;  Laterality: Right;  . PERIPHERAL VASCULAR CATHETERIZATION Right 10/12/2015   Procedure: Lower Extremity Angiography;  Surgeon: [place] Huxley, MD;  [place]: [place];  Service: Cardiovascular;  Laterality: Right;  . PERIPHERAL VASCULAR CATHETERIZATION Right 10/12/2015   Procedure: Lower Extremity Intervention;  Surgeon: [place] Huxley, MD;  [place]: [place];  Service: Cardiovascular;  Laterality: Right;  .  PERIPHERAL VASCULAR CATHETERIZATION Left 02/10/2016   Procedure: Lower Extremity Angiography;  Surgeon: [place] Huxley, MD;  [place]: [place];  Service: Cardiovascular;  Laterality: Left;  . PERIPHERAL VASCULAR CATHETERIZATION  02/10/2016   Procedure: Lower Extremity Intervention;  Surgeon: [place] Huxley, MD;  Location: [place];  Service: Cardiovascular;;  . PERIPHERAL VASCULAR CATHETERIZATION Right 02/24/2016   Procedure: Lower Extremity Angiography;  Surgeon: [place] Huxley, MD;  Location: [place];  Service: Cardiovascular;  Laterality: Right;  . SHOULDER CLOSED REDUCTION Right 04/10/2016   Procedure: CLOSED REDUCTION SHOULDER;  Surgeon: Ashley Knows, MD;  Location: [place];  Service: Orthopedics;  Laterality: Right;  . TONSILLECTOMY    . TOTAL KNEE ARTHROPLASTY  2015  . VAGINAL HYSTERECTOMY     Family History  Problem Relation Age of Onset  . Alcohol abuse Father   . Cancer Father   . Diabetes Paternal Grandfather   . Breast cancer Daughter 108   Social History   Tobacco Use  . Smoking status: Former Smoker    Packs/day: 1.00    Years: 10.00    Pack years: 10.00    Types: Cigarettes    Quit date: 02/23/1981    Years since quitting: 39.2  . Smokeless tobacco: Never Used  Substance Use Topics  . Alcohol use: No    Comment: rare occ   Allergies  Allergen Reactions  . Penicillins Anaphylaxis and Other (See Comments)    Did it involve swelling of the face/tongue/throat, SOB, or low BP? Yes Did it involve sudden or severe rash/hives, skin peeling, or any reaction on the inside of your mouth or nose? No Did you need to seek medical attention at a hospital or doctor's office? Yes When did it last happen?50 + years If all above answers are "NO", may proceed with cephalosporin use.    . Lipitor [Atorvastatin] Other (See Comments)    Muscle cramps in legs   Prior to Admission medications   Medication Sig Start Date End Date Taking?  Authorizing Provider  acetaminophen (TYLENOL) 500 MG tablet Take 500 mg by mouth every 6 (six) hours as needed for moderate pain or headache.   Yes [provider]  Alpha-Lipoic Acid 600 MG CAPS Take 600 mg by mouth daily.   Yes [provider]  calcium carbonate (OSCAL) 1500 (600 Ca) MG TABS tablet Take 600 mg of elemental calcium by mouth in the morning and at bedtime.   Yes [provider]  carvedilol (COREG) 25 MG tablet Take 25 mg by mouth 2 (two) times daily with a meal.   Yes [provider]  Cholecalciferol (D3-1000) 25 MCG (1000 UT) capsule Take 1,000 Units by mouth daily.   Yes [provider]  cloNIDine (CATAPRES) 0.1 MG tablet Take 0.1 mg by mouth 2 (two) times daily.   Yes [provider]  Coenzyme Q10 (COQ10) 50 MG CAPS Take 50 mg by mouth daily.   Yes [provider]  Cyanocobalamin (B-12 COMPLIANCE INJECTION) 1000 MCG/ML KIT Inject 1,000 mcg as directed every 30 (thirty)  days.   Yes [provider]  diltiazem (CARDIZEM CD) 240 MG 24 hr capsule Take 240 mg by mouth daily. 04/19/20  Yes [provider]  donepezil (ARICEPT) 10 MG tablet Take 10 mg by mouth at bedtime.   Yes [provider]  ELIQUIS 5 MG TABS tablet Take 1 tablet by mouth twice a day Patient taking differently: Take 5 mg by mouth 2 (two) times daily. 07/11/19  Yes Kris Hartmann, NP  rosuvastatin (CRESTOR) 10 MG tablet Take 10 mg by mouth daily.   Yes [provider]  TURMERIC PO Take 125 mg by mouth daily.   Yes [provider]  ZINC-VITAMIN C PO Take by mouth.   Yes [provider]    Review of Systems  Constitutional: Negative for appetite change (eats when hungry) and fatigue.  HENT: Positive for rhinorrhea. Negative for congestion and sore throat.   Eyes: Negative.   Respiratory: Negative for cough and shortness of breath.   Cardiovascular: Negative for chest pain, palpitations and leg swelling.   Gastrointestinal: Negative for abdominal distention and abdominal pain.  Endocrine: Negative.   Genitourinary: Negative.   Musculoskeletal: Negative for back pain and neck pain.  Skin: Negative.   Allergic/Immunologic: Negative.   Neurological: Negative for dizziness and light-headedness.  Hematological: Negative for adenopathy. Does not bruise/bleed easily.  Psychiatric/Behavioral: Negative for dysphoric mood and sleep disturbance. The patient is not nervous/anxious.    Vitals:   05/13/20 0937  BP: 130/60  Pulse: (!) 115  Resp: 18  SpO2: 99%  Weight: 143 lb (64.9 kg)  Height: 5' 5"  (1.651 m)   Wt Readings from Last 3 Encounters:  05/13/20 143 lb (64.9 kg)  05/02/20 141 lb 6.4 oz (64.1 kg)  02/05/20 135 lb (61.2 kg)   [facility] Results  Component Value Date   CREATININE 0.97 05/02/2020   CREATININE 0.84 05/02/2020   CREATININE 0.94 05/01/2020    Physical Exam Vitals and nursing note reviewed. Exam conducted with a chaperone present (daughter is with patient).  Constitutional:      Appearance: Normal appearance.  HENT:     Head: Normocephalic and atraumatic.  Cardiovascular:     Rate and Rhythm: Tachycardia present. Rhythm irregular.  Pulmonary:     Effort: No respiratory distress.     Breath sounds: No wheezing or rales.  Abdominal:     General: There is no distension.     Palpations: Abdomen is soft.     Tenderness: There is no abdominal tenderness.  Musculoskeletal:        General: No tenderness.     Cervical back: Normal range of motion and neck supple.     Right lower leg: No edema.     Left lower leg: No edema.  Skin:    General: Skin is warm and dry.  Neurological:     Mental Status: She is alert and oriented to person, place, and time. Mental status is at baseline.  Psychiatric:        Mood and Affect: Mood normal.        Behavior: Behavior normal.    Assessment & Plan:  1: Chronic heart failure with preserved ejection fraction with structural changes  (LAE)- - NYHA class I - euvolemic today - not weighing daily but thinks she may have scales at home; instructed to weigh every morning, write the weight down and call for an overnight weight gain of > 2 pounds or a weekly weight gain of > 5 pounds -  not adding salt but says that she really only eats when she's hungry - trying to be active and doing some walking with her cane - just took her medications when she was in the exam room - BNP 05/01/20 was 234.7  2: HTN- - BP initially elevated (161/92) but had improved upon recheck with manual cuff at the end of the visit (130/60) - saw PCP ([facility]) 04/19/20 - BMP 05/02/20 reviewed and showed sodium 139, potassium 3.7, creatinine 0.97 and GFR 58  3: Atrial fibrillation- - saw cardiology [name]) 05/04/20 - tachy initially but she just took her medications; improving by the end of the visit  4: Dementia- - appears to be stable - using pill box for medications   Medication bottles reviewed.   Due to HF stability, will not make a return appointment for patient at this time. Advised patient that she could call back at anytime for any questions or to make another appointment and patient was comfortable with this plan.

## 2020-05-13 ENCOUNTER — Other Ambulatory Visit: Payer: Self-pay

## 2020-05-13 ENCOUNTER — Ambulatory Visit: Payer: Medicare Other | Attending: Family | Admitting: Family

## 2020-05-13 ENCOUNTER — Encounter: Payer: Self-pay | Admitting: Family

## 2020-05-13 VITALS — BP 130/60 | HR 115 | Resp 18 | Ht 65.0 in | Wt 143.0 lb

## 2020-05-13 DIAGNOSIS — Z8673 Personal history of transient ischemic attack (TIA), and cerebral infarction without residual deficits: Secondary | ICD-10-CM | POA: Diagnosis not present

## 2020-05-13 DIAGNOSIS — E785 Hyperlipidemia, unspecified: Secondary | ICD-10-CM | POA: Insufficient documentation

## 2020-05-13 DIAGNOSIS — I739 Peripheral vascular disease, unspecified: Secondary | ICD-10-CM | POA: Insufficient documentation

## 2020-05-13 DIAGNOSIS — F0390 Unspecified dementia without behavioral disturbance: Secondary | ICD-10-CM | POA: Diagnosis not present

## 2020-05-13 DIAGNOSIS — Z96651 Presence of right artificial knee joint: Secondary | ICD-10-CM | POA: Diagnosis not present

## 2020-05-13 DIAGNOSIS — I471 Supraventricular tachycardia: Secondary | ICD-10-CM | POA: Diagnosis not present

## 2020-05-13 DIAGNOSIS — Z880 Allergy status to penicillin: Secondary | ICD-10-CM | POA: Diagnosis not present

## 2020-05-13 DIAGNOSIS — I110 Hypertensive heart disease with heart failure: Secondary | ICD-10-CM | POA: Diagnosis present

## 2020-05-13 DIAGNOSIS — I4891 Unspecified atrial fibrillation: Secondary | ICD-10-CM | POA: Diagnosis not present

## 2020-05-13 DIAGNOSIS — F0280 Dementia in other diseases classified elsewhere without behavioral disturbance: Secondary | ICD-10-CM

## 2020-05-13 DIAGNOSIS — Z87891 Personal history of nicotine dependence: Secondary | ICD-10-CM | POA: Insufficient documentation

## 2020-05-13 DIAGNOSIS — I5032 Chronic diastolic (congestive) heart failure: Secondary | ICD-10-CM | POA: Diagnosis not present

## 2020-05-13 DIAGNOSIS — Z90710 Acquired absence of both cervix and uterus: Secondary | ICD-10-CM | POA: Diagnosis not present

## 2020-05-13 DIAGNOSIS — Z79899 Other long term (current) drug therapy: Secondary | ICD-10-CM | POA: Insufficient documentation

## 2020-05-13 DIAGNOSIS — I482 Chronic atrial fibrillation, unspecified: Secondary | ICD-10-CM

## 2020-05-13 DIAGNOSIS — Z7901 Long term (current) use of anticoagulants: Secondary | ICD-10-CM | POA: Insufficient documentation

## 2020-05-13 DIAGNOSIS — G309 Alzheimer's disease, unspecified: Secondary | ICD-10-CM

## 2020-05-13 DIAGNOSIS — I10 Essential (primary) hypertension: Secondary | ICD-10-CM

## 2020-05-13 NOTE — Patient Instructions (Addendum)
Begin weighing daily and call for an overnight weight gain of > 2 pounds or a weekly weight gain of >5 pounds.   Call us in the future if you'd like to schedule another appointment.  

## 2020-05-18 ENCOUNTER — Encounter (INDEPENDENT_AMBULATORY_CARE_PROVIDER_SITE_OTHER): Payer: Medicare Other

## 2020-05-18 ENCOUNTER — Ambulatory Visit (INDEPENDENT_AMBULATORY_CARE_PROVIDER_SITE_OTHER): Payer: Medicare Other | Admitting: Vascular Surgery

## 2020-05-19 ENCOUNTER — Other Ambulatory Visit: Payer: Self-pay | Admitting: *Deleted

## 2020-05-19 NOTE — Patient Outreach (Signed)
[facility]) Care Management  05/19/2020  [place] June 27, 1934 [id number]   Telephone Assessment-Unsuccessful  Outreach #3 RN attempted another outreach call to the daughter [name]) due to the reported memory issues with pt. Unsuccessful however RN able to leave a HIPAA approved voice message requesting a call back.  Will continue with another outreach in a few weeks for pending [facility] services.   [name], RN Care Management Coordinator [facility] Main Office [phone]

## 2020-05-20 ENCOUNTER — Other Ambulatory Visit: Payer: Self-pay | Admitting: *Deleted

## 2020-05-20 NOTE — Patient Outreach (Signed)
[facility]) Care Management  05/20/2020  GABRIEL PAULDING 08/06/1934 [id number]   Telephone Assessment-Successful Received a call back for the pt's daughter [name].   Returned a call to the daughter [name]) and scheduled a time that would be more convinent to discuss possible needs for this pt. Monday at 1:00 PM was the chosen day and time.   Will follow up accordingly concerning the needs for [facility] services for this pt.   [name], RN Care Management Coordinator [facility] Main Office [phone]

## 2020-05-24 ENCOUNTER — Other Ambulatory Visit: Payer: Self-pay | Admitting: *Deleted

## 2020-05-24 NOTE — Patient Outreach (Signed)
[facility]) Care Management  05/24/2020  LILLEE MOONEYHAN [birth year] [id number]   Telephone Assessment-Enrollment-HF  RN spoke directly with the POA-daughter [name]). RN explained the purpose for the call and recent referral. Initially there was inquiry on possible services for memory patients in the home. Provided information on personal care services or private hired for available services in the home through local agencies if applicable however other options may involve memory unit placements if family is unable to accommodate her ongoing care. Social work consult not needed at this time however aware this services is available at anytime through [facility].  Daughter explained the involved agency with [facility] of PT/OT and the evaluation from the RN. States pt lives alone in a condo and is very independently with her care at this time. States pt still drives 2-4 miles from her home to the post office and grocery store when needed. All other transportation provided by daughter [name] for medical appointments. States her available assistance 2 daughter (Lori/Amonie and a relative in [place] who calls regularly). Caregiver states pt has dementia however the family has a plan to move pt into there home if pt is unable to manage her care independently. States they has set up a system in the home to alert pt of the day of the week and medication times that has been prepared one month in advance.  States no other issues at this time as pt HTN is controlled with ongoing medication. However caregiver was not aware of daily weights for ongoing HF management. States she will start and remind pt to weigh daily and report for documentation. Offered [facility] Calender for that documentation(receptive).   Offered to enroll pt into the [facility] program for HF for ongoing monitoring and educated pt accordingly on HF action zones and what to do if acute. Verified and review all current medications with  no needed refills as pt has a sufficient supply, again with one month pill box filled for pt's convenience. Able to generate a plan of care with goals and interventions discussed for ongoing management of care related to her HF. Will update pt's provider of pt's disposition with [facility] services and continue to be available if needed for community resources and any other assistance related to pt's management of care.   Will mail-out [facility] calender, [facility] packet, Welcome letter and notify the pt's provider of enrollment. RN will follow up in a few weeks with ongoing [facility] services and pt's ongoing management of care via [facility] education and plan of care.  Goals Addressed            This Visit's Progress   . [facility]-Track and Manage Fluids and Swelling-Heart Failure       Timeframe:  Long-Range Goal Priority:  Medium Start Date:   05/24/2020                          Expected End Date:  09/10/2020                     Follow Up Date 06/24/2020    - call office if I gain more than 2 pounds in one day or 5 pounds in one week - do ankle pumps when sitting - keep legs up while sitting - track weight in diary - use salt in moderation - watch for swelling in feet, ankles and legs every day - weigh myself daily    Why is this  important?    It is important to check your weight daily and watch how much salt and liquids you have.   It will help you to manage your heart failure.    Notes: Educated on discussed interventions noted above to reduce the risk of hospitalization and exacerbation of symptoms. Will send tool (Calender) and encouraged daily documentation of weights for providers to view on monitoring pt's ongoing HF.     . [facility]-Track and Manage Symptoms-Heart Failure       Timeframe:  Short-Term Goal Priority:  Medium Start Date:   05/24/2020                          Expected End Date:    07/12/2020                   Follow Up Date 06/23/2020   - begin a heart failure diary - develop a rescue plan -  follow rescue plan if symptoms flare-up - know when to call the doctor - track symptoms and what helps feel better or worse    Why is this important?    You will be able to handle your symptoms better if you keep track of them.   Making some simple changes to your lifestyle will help.   Eating healthy is one thing you can do to take good care of yourself.    Notes:        [name], RN Care Management Coordinator [facility] Main Office [phone]

## 2020-06-10 ENCOUNTER — Ambulatory Visit: Payer: Self-pay | Admitting: *Deleted

## 2020-06-23 ENCOUNTER — Other Ambulatory Visit: Payer: Self-pay | Admitting: *Deleted

## 2020-06-23 NOTE — Patient Outreach (Signed)
[facility]) Care Management  06/23/2020  Ashley Hill 1934-11-21 [id number]   Telephone Assessment-unsuccessful  RN attempted outreach call today however unsuccessful. RN able to  Leave a HIPAA approved voice message requesting a call back.  Will follow up once again over the next week for continuous [facility] services.  [name], RN Care Management Coordinator [facility] Main Office [phone]

## 2020-06-28 DIAGNOSIS — K8043 Calculus of bile duct with acute cholecystitis with obstruction: Secondary | ICD-10-CM

## 2020-06-29 ENCOUNTER — Other Ambulatory Visit: Payer: Self-pay | Admitting: *Deleted

## 2020-06-29 ENCOUNTER — Encounter: Payer: Self-pay | Admitting: *Deleted

## 2020-06-29 NOTE — Patient Outreach (Signed)
[facility]) Care Management  06/29/2020  Ashley Hill 1934-09-25 [id number]   Telephone Assessment-Unsuccessful  RN attempted outreach call today however unsuccessful. RN able to leave a HIPAA approved voice message requesting a call back.  Will rescheduled another outreach over the next week for ongoing [facility] services.  [name], RN Care Management Coordinator [facility] Main Office [phone]

## 2020-07-05 ENCOUNTER — Encounter: Payer: Self-pay | Admitting: *Deleted

## 2020-07-05 ENCOUNTER — Other Ambulatory Visit: Payer: Self-pay | Admitting: *Deleted

## 2020-07-05 NOTE — Patient Outreach (Signed)
[facility]) Care Management  07/05/2020  Ashley Hill 21-May-1934 [id number]   Telephone Assessment-Successful-HF  RN spoke with POA daughter Ashley Hill today concerning this pt's ongoing management of care. Reports pt is doing "great" and increasing her ambulatation with walks around the block 3 days weekly with her neighbors. Pt continue her daily monitoring with weights lingering around 137-139 lbs with no 3 lbs overnight or 5 lbs within the one week. Verified receival of the [facility] tools for daily documenting of all weights related to her HF. Denies any fluid retention with no acute symptoms encountered. Additional assessment inquired completed.  Plan of care reviewed and discussed with all goals and interventions. Verified pt adherent with all medications, appointments and asymptomatic over the last month since the last conversation. Daughter continue to build a relationship with pt's providers by accessing online messages that providers response a lot faster in resolving any related issues for the pt. [facility] has completed services and no longer involved however daughter inquiring on an adult daycare program office at [facility] for patients with memory issues. Currently there is an opening for Saturday that may be considered amongst the family. No other issues or events discussed today.   Will follow next month with ongoing case management services related to pt's ongoing heart failure. No other inquires or request at this time.   Goals Addressed            This Visit's Progress   . [facility]-Track and Manage Fluids and Swelling-Heart Failure   On track    Follow Up Date 07/29/2020  Timeframe:  Long-Range Goal Priority:  Medium Start Date:   05/24/2020                          Expected End Date:  09/10/2020                     - call office if I gain more than 2 pounds in one day or 5 pounds in one week - do ankle pumps when sitting - keep legs up while sitting -  track weight in diary - use salt in moderation - watch for swelling in feet, ankles and legs every day - weigh myself daily    Why is this important?    It is important to check your weight daily and watch how much salt and liquids you have.   It will help you to manage your heart failure.    Notes:  1/10-Educated on discussed interventions noted above to reduce the risk of hospitalization and exacerbation of symptoms. Will send tool (Calender) and encouraged daily documentation of weights for providers to view on monitoring pt's ongoing HF.     . [facility]-Track and Manage Symptoms-Heart Failure   On track    Follow Up Date 07/29/2020 Timeframe:  Short-Term Goal Priority:  Medium Start Date:   05/24/2020                          Expected End Date:    08/12/2020                   - begin a heart failure diary - develop a rescue plan - follow rescue plan if symptoms flare-up - know when to call the doctor - track symptoms and what helps feel better or worse    Why is this important?  You will be able to handle your symptoms better if you keep track of them.   Making some simple changes to your lifestyle will help.   Eating healthy is one thing you can do to take good care of yourself.    Notes:  2/21- POA daughter Ashley Hill indicates pt is doing "great". States pt is ambulating around the block with neighbors and continue to track her weights and aware of who to notify with any symptoms. Utilizing to resources send by this RN case management.       [name], RN Care Management Coordinator [facility] Main Office [phone]

## 2020-07-29 ENCOUNTER — Other Ambulatory Visit: Payer: Self-pay | Admitting: Internal Medicine

## 2020-07-29 ENCOUNTER — Other Ambulatory Visit: Payer: Self-pay | Admitting: *Deleted

## 2020-07-29 DIAGNOSIS — Z1231 Encounter for screening mammogram for malignant neoplasm of breast: Secondary | ICD-10-CM

## 2020-07-29 NOTE — Patient Outreach (Signed)
[facility]) Care Management  07/29/2020  Ashley Hill 23-Mar-1935 [id number]   Telephone Assessment-Unsuccessful  RN attempted outreach call however unsuccessful. RN was able to leave a HIPAA approved voice message to the POA number [name] requesting call back.  Will further engage at that time and rescheduled another outreach call over the next week.  [name], RN Care Management Coordinator [facility] Main Office [phone]

## 2020-08-04 ENCOUNTER — Other Ambulatory Visit: Payer: Self-pay | Admitting: *Deleted

## 2020-08-04 NOTE — Patient Outreach (Signed)
[facility]) Care Management  08/04/2020  CHAUNICE OBIE [birth year] [id number]   Telephone Assessment-Unsuccessful  RN attempted outreach call however remains unsuccessful. RN able to leave a HIPAA approved voice message requesting a call back. Will further engage at that time.  Will send outreach letter and rescheduled another outreach over the next week.  [name], RN Care Management Coordinator [facility] Main Office [phone]

## 2020-08-10 ENCOUNTER — Other Ambulatory Visit: Payer: Self-pay | Admitting: *Deleted

## 2020-08-10 ENCOUNTER — Other Ambulatory Visit: Payer: Self-pay | Admitting: Internal Medicine

## 2020-08-10 DIAGNOSIS — Z1231 Encounter for screening mammogram for malignant neoplasm of breast: Secondary | ICD-10-CM

## 2020-08-10 NOTE — Patient Outreach (Signed)
[facility]) Care Management  08/10/2020  Ashley Hill 1934-12-25 [id number]   Telephone Assessment-Successful-HF  RN spoke with daughter Ashley Hill [facility]) who reports pt is doing well and had a recent visit with her primary provider with a good report. Pt remains at her baseline weight with no encountered symptoms. Report her ongoing neuropathy and a new prescription for Voltaren gel PRN. No other changes reported.  Plan of care review and discussed related to goals and interventions. Updated via noted documentation within the plan of care. Verified pt remains in the GREEN zone with no acte needs or issues at this time. Pt remains adherent with her daily weights and documents in the [facility] calendar for providers to view. Family interviewing for hire assistance within the home for added safety for the pt. Hired help would be providing services for a few hrs weekly at this time but maybe consider for more days and time if needed in the future.   Will follow up next month with ongoing case management services and assist accordingly with the available disciplines or resources as needed with [facility].  Goals Addressed            This Visit's Progress   . [facility]-Track and Manage Fluids and Swelling-Heart Failure   On track    Follow Up Date 09/07/2020  Timeframe:  Long-Range Goal Priority:  Medium Start Date:   05/24/2020                          Expected End Date:  09/10/2020                     - call office if I gain more than 2 pounds in one day or 5 pounds in one week - do ankle pumps when sitting - keep legs up while sitting - track weight in diary - use salt in moderation - watch for swelling in feet, ankles and legs every day - weigh myself daily    Why is this important?    It is important to check your weight daily and watch how much salt and liquids you have.   It will help you to manage your heart failure.    Notes:  1/10-Educated on discussed interventions noted  above to reduce the risk of hospitalization and exacerbation of symptoms. Will send tool (Calender) and encouraged daily documentation of weights for providers to view on monitoring pt's ongoing HF.     . [facility]-Track and Manage Symptoms-Heart Failure   On track    Follow Up Date 09/07/2020 Timeframe:  Short-Term Goal Priority:  Medium Start Date:   05/24/2020                          Expected End Date:    09/10/2020                   - begin a heart failure diary - develop a rescue plan - follow rescue plan if symptoms flare-up - know when to call the doctor - track symptoms and what helps feel better or worse    Why is this important?    You will be able to handle your symptoms better if you keep track of them.   Making some simple changes to your lifestyle will help.   Eating healthy is one thing you can do to take good care of yourself.  Notes:  3/29- Primary caregiver reports pt had a recent visit with her provider and Dr. Judithann Sheen was very please with pt and all labs were good. Only added Voltaren gel for neuropathy pain PRN with no other changes in her medications.Pt continue to weight daily and remain around her baseline weight 140 lbs with no related symptoms or fluid retention. 2/21- POA daughter Ashley Hill indicates pt is doing "great". States pt is ambulating around the block with neighbors and continue to track her weights and aware of who to notify with any symptoms. Utilizing to resources send by this RN case management.       [name], RN Care Management Coordinator [facility] Main Office [phone]

## 2020-08-24 ENCOUNTER — Ambulatory Visit (INDEPENDENT_AMBULATORY_CARE_PROVIDER_SITE_OTHER): Payer: Medicare Other | Admitting: Vascular Surgery

## 2020-08-24 ENCOUNTER — Encounter (INDEPENDENT_AMBULATORY_CARE_PROVIDER_SITE_OTHER): Payer: Self-pay | Admitting: Vascular Surgery

## 2020-08-24 ENCOUNTER — Other Ambulatory Visit: Payer: Self-pay

## 2020-08-24 ENCOUNTER — Ambulatory Visit (INDEPENDENT_AMBULATORY_CARE_PROVIDER_SITE_OTHER): Payer: Medicare Other

## 2020-08-24 VITALS — BP 128/69 | HR 71 | Resp 16 | Wt 135.8 lb

## 2020-08-24 DIAGNOSIS — I10 Essential (primary) hypertension: Secondary | ICD-10-CM

## 2020-08-24 DIAGNOSIS — G309 Alzheimer's disease, unspecified: Secondary | ICD-10-CM | POA: Diagnosis not present

## 2020-08-24 DIAGNOSIS — I6523 Occlusion and stenosis of bilateral carotid arteries: Secondary | ICD-10-CM

## 2020-08-24 DIAGNOSIS — E785 Hyperlipidemia, unspecified: Secondary | ICD-10-CM

## 2020-08-24 DIAGNOSIS — F0280 Dementia in other diseases classified elsewhere without behavioral disturbance: Secondary | ICD-10-CM

## 2020-08-24 DIAGNOSIS — I70211 Atherosclerosis of native arteries of extremities with intermittent claudication, right leg: Secondary | ICD-10-CM

## 2020-08-24 NOTE — Progress Notes (Signed)
MRN : [id number]  Ashley Hill is a 86 y.o. ([birth year]) female who presents with chief complaint of  Chief Complaint  Patient presents with  . Follow-up    6 month ultrasound follow up  .  History of Present Illness: Patient returns today in follow up of multiple vascular issues.  She is doing quite well.  She is walking more.  The neuropathic pain in her right foot is still present but is not really disabling to her at this point.  Her walking level has picked up tremendously over the past couple of years.  No new ulceration or infection.  Her ABIs today are up to 0.64 on the right and 0.81 on the left.  Previously these were 0.54 and 0.72. She is also followed for carotid disease.  She has had some cognitive decline and memory issues, but no focal neurologic issues. Specifically, the patient denies amaurosis fugax, speech or swallowing difficulties, or arm or leg weakness or numbness. Duplex today shows stable 1 to 39% carotid artery stenosis bilaterally without progression from previous studies.   Current Outpatient Medications  Medication Sig Dispense Refill  . acetaminophen (TYLENOL) 500 MG tablet Take 500 mg by mouth every 6 (six) hours as needed for moderate pain or headache.    . carvedilol (COREG) 25 MG tablet Take 25 mg by mouth 2 (two) times daily with a meal.    . Cyanocobalamin (B-12 COMPLIANCE INJECTION) 1000 MCG/ML KIT Inject 1,000 mcg as directed every 30 (thirty) days.    Marland Kitchen diltiazem (CARDIZEM CD) 240 MG 24 hr capsule Take 240 mg by mouth daily.    Marland Kitchen donepezil (ARICEPT) 10 MG tablet Take 10 mg by mouth at bedtime.    Marland Kitchen ELIQUIS 5 MG TABS tablet Take 1 tablet by mouth twice a day (Patient taking differently: Take 5 mg by mouth 2 (two) times daily.) 60 tablet 2  . furosemide (LASIX) 40 MG tablet Take 1 tablet (40 mg total) by mouth daily. 30 tablet 0  . rosuvastatin (CRESTOR) 10 MG tablet Take 10 mg by mouth daily.    . Alpha-Lipoic Acid 600 MG CAPS Take 600 mg by mouth daily.  (Patient not taking: No sig reported)    . calcium carbonate (OSCAL) 1500 (600 Ca) MG TABS tablet Take 600 mg of elemental calcium by mouth in the morning and at bedtime. (Patient not taking: No sig reported)    . Cholecalciferol (D3-1000) 25 MCG (1000 UT) capsule Take 1,000 Units by mouth daily. (Patient not taking: No sig reported)    . cloNIDine (CATAPRES) 0.1 MG tablet Take 0.1 mg by mouth 2 (two) times daily. (Patient not taking: Reported on 08/24/2020)    . Coenzyme Q10 (COQ10) 50 MG CAPS Take 50 mg by mouth daily. (Patient not taking: No sig reported)    . lidocaine (LIDODERM) 5 % Place 1 patch onto the skin daily. Remove & Discard patch within 12 hours or as directed by MD (Patient not taking: No sig reported) 10 patch 0  . potassium chloride (KLOR-CON) 10 MEQ tablet Take 2 tablets (20 mEq total) by mouth daily. 60 tablet 0  . TURMERIC PO Take 125 mg by mouth daily. (Patient not taking: No sig reported)    . ZINC-VITAMIN C PO Take by mouth. (Patient not taking: No sig reported)     No current facility-administered medications for this visit.    Past Medical History:  Diagnosis Date  . Arrhythmia    atrial fibrillation  .  CHF (congestive heart failure) ([place])   . Dementia ([place])   . Dyslipidemia   . Dysrhythmia   . Ectopic atrial tachycardia ([place])   . Environmental allergies   . H/O scarlet fever   . Heart murmur    IN PAST  . HOH (hard of hearing)   . Hyperlipidemia   . Hypertension   . Peripheral vascular disease ([place])   . Seizure disorder ([place])   . 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 Arnold Long, [place].  . Transient cerebral ischemia     Past Surgical History:  Procedure Laterality Date  . ABLATION OF DYSRHYTHMIC FOCUS    . AUGMENTATION MAMMAPLASTY Bilateral   . CATARACT EXTRACTION W/PHACO Left 08/28/2017   Procedure: CATARACT EXTRACTION PHACO AND INTRAOCULAR LENS PLACEMENT (IOC);  Surgeon: Birder Robson, MD;  Location: [facility] ORS;  Service: Ophthalmology;  Laterality: Left;  [place] 00:26 AP% 18.4 CDE 4.85 Fluid pa ck lot # [id number]  . CORONARY ANGIOPLASTY    . ENDARTERECTOMY FEMORAL Right 08/07/2016   Procedure: ENDARTERECTOMY FEMORAL;  Surgeon: Algernon Huxley, MD;  Location: [facility];  Service: Vascular;  Laterality: Right;  . ERCP N/A 08/07/2019   Procedure: ENDOSCOPIC RETROGRADE CHOLANGIOPANCREATOGRAPHY (ERCP);  Surgeon: [name], MD;  Location: [facility] ENDOSCOPY;  Service: Endoscopy;  Laterality: N/A;  . EYE SURGERY    . JOINT REPLACEMENT     knee  . KNEE ARTHROSCOPY W/ MENISCAL REPAIR     Right   . PERIPHERAL VASCULAR BALLOON ANGIOPLASTY N/A 07/26/2016   Procedure: Peripheral Vascular Balloon Angioplasty;  Surgeon: Algernon Huxley, MD;  Location: [place] [facility];  Service: Cardiovascular;  Laterality: N/A;  . PERIPHERAL VASCULAR CATHETERIZATION Right 01/11/2015   Procedure: Lower Extremity Angiography;  Surgeon: Algernon Huxley, MD;  Location: [place] [facility];  Service: Cardiovascular;  Laterality: Right;  . PERIPHERAL VASCULAR CATHETERIZATION Right 06/21/2015   Procedure: Lower Extremity Angiography;  Surgeon: Algernon Huxley, MD;  Location: [place] [facility];  Service: Cardiovascular;  Laterality: Right;  . PERIPHERAL VASCULAR CATHETERIZATION Right 06/21/2015   Procedure: Lower Extremity Intervention;  Surgeon: Algernon Huxley, MD;  Location: [place] [facility];  Service: Cardiovascular;  Laterality: Right;  . PERIPHERAL VASCULAR CATHETERIZATION Right 10/12/2015   Procedure: Lower Extremity Angiography;  Surgeon: Algernon Huxley, MD;  Location: [place] [facility];  Service: Cardiovascular;  Laterality: Right;  . PERIPHERAL VASCULAR CATHETERIZATION Right 10/12/2015   Procedure: Lower Extremity Intervention;  Surgeon: Algernon Huxley, MD;  Location: [place] [facility];  Service: Cardiovascular;  Laterality: Right;  . PERIPHERAL VASCULAR CATHETERIZATION Left 02/10/2016   Procedure: Lower Extremity  Angiography;  Surgeon: Algernon Huxley, MD;  Location: [place] [facility];  Service: Cardiovascular;  Laterality: Left;  . PERIPHERAL VASCULAR CATHETERIZATION  02/10/2016   Procedure: Lower Extremity Intervention;  Surgeon: Algernon Huxley, MD;  Location: [place] [facility];  Service: Cardiovascular;;  . PERIPHERAL VASCULAR CATHETERIZATION Right 02/24/2016   Procedure: Lower Extremity Angiography;  Surgeon: Algernon Huxley, MD;  Location: [place] [facility];  Service: Cardiovascular;  Laterality: Right;  . SHOULDER CLOSED REDUCTION Right 04/10/2016   Procedure: CLOSED REDUCTION SHOULDER;  Surgeon: Hessie Knows, MD;  Location: [facility];  Service: Orthopedics;  Laterality: Right;  . TONSILLECTOMY    . TOTAL KNEE ARTHROPLASTY  2015  . VAGINAL HYSTERECTOMY       Social History   Tobacco Use  . Smoking  status: Former Smoker    Packs/day: 1.00    Years: 10.00    Pack years: 10.00    Types: Cigarettes    Quit date: 02/23/1981    Years since quitting: 39.5  . Smokeless tobacco: Never Used  Vaping Use  . Vaping Use: Never used  Substance Use Topics  . Alcohol use: No    Comment: rare occ  . Drug use: No       Family History  Problem Relation Age of Onset  . Alcohol abuse Father   . Cancer Father   . Diabetes Paternal Grandfather   . Breast cancer Daughter 37     Allergies  Allergen Reactions  . Penicillins Anaphylaxis and Other (See Comments)    Did it involve swelling of the face/tongue/throat, SOB, or low BP? Yes Did it involve sudden or severe rash/hives, skin peeling, or any reaction on the inside of your mouth or nose? No Did you need to seek medical attention at a hospital or doctor's office? Yes When did it last happen?50 + years If all above answers are "NO", may proceed with cephalosporin use.    . Lipitor [Atorvastatin] Other (See Comments)    Muscle cramps in legs     REVIEW OF SYSTEMS(Negative unless checked)  Constitutional: [] ???Weight  loss[] ???Fever[] ???Chills Cardiac:[] ???Chest pain[] ???Chest pressure[] ???Palpitations [] ???Shortness of breath when laying flat [] ???Shortness of breath at rest [] ???Shortness of breath with exertion. Vascular: [x] ???Pain in legs with walking[] ???Pain in legsat rest[] ???Pain in legs when laying flat [x] ???Claudication [] ???Pain in feet when walking [] ???Pain in feet at rest [] ???Pain in feet when laying flat [] ???History of DVT [] ???Phlebitis [] ???Swelling in legs [] ???Varicose veins [] ???Non-healing ulcers Pulmonary: [] ???Uses home oxygen [] ???Productive cough[] ???Hemoptysis [] ???Wheeze [] ???COPD [] ???Asthma Neurologic: [] ???Dizziness [] ???Blackouts [] ???Seizures [] ???History of stroke [] ???History of TIA[] ???Aphasia [] ???Temporary blindness[] ???Dysphagia [] ???Weaknessor numbness in arms [x] ???Weakness or numbnessin legsX memory loss worsening Musculoskeletal: [] ???Arthritis [] ???Joint swelling [] ???Joint pain [] ???Low back pain Hematologic:[] ???Easy bruising[] ???Easy bleeding [] ???Hypercoagulable state [] ???Anemic  Gastrointestinal:[] ???Blood in stool[] ???Vomiting blood[] ???Gastroesophageal reflux/heartburn[] ???Abdominal pain Genitourinary: [] ???Chronic kidney disease [] ???Difficulturination [] ???Frequenturination [] ???Burning with urination[] ???Hematuria Skin: [] ???Rashes [] ???Ulcers [] ???Wounds Psychological: [x] ???History of anxiety[] ???History of major depression.  Physical Examination  BP 128/69 (BP Location: Right Arm)   Pulse 71   Resp 16   Wt 135 lb 12.8 oz (61.6 kg)   BMI 22.60 kg/m  Gen:  WD/WN, NAD. Appears younger than stated age. Head: [place]/AT, No temporalis wasting. Ear/Nose/Throat: Hearing grossly intact, nares w/o erythema or drainage Eyes: Conjunctiva clear. Sclera non-icteric Neck: Supple.  Trachea midline Pulmonary:  Good air movement, no use of  accessory muscles.  Cardiac: RRR, no JVD Vascular:  Vessel Right Left  Radial Palpable Palpable                          PT 1+ Palpable 1+ Palpable  DP 1+ Palpable 2+ Palpable   Gastrointestinal: soft, non-tender/non-distended. No guarding/reflex.  Musculoskeletal: M/S 5/5 throughout.  No deformity or atrophy. Mild LE edema. Neurologic: Sensation grossly intact in extremities.  Symmetrical.  Speech is fluent.  Psychiatric: Judgment intact, Mood & affect appropriate for pt's clinical situation. Dermatologic: No rashes or ulcers noted.  No cellulitis or open wounds.       Labs No results found for this or any previous visit (from the past 2160 hour(s)).  Radiology No results found.  Assessment/Plan Hypertension blood pressure control important in reducing the progression of atherosclerotic disease. On appropriate oral medications.   Dementia in Alzheimer's disease Seems to bereasonably stable today  Hyperlipidemia lipid control  important in reducing the progression of atherosclerotic disease. Continue statin therapy  Atherosclerosis of native arteries of extremity with intermittent claudication (HCC) Her ABIs today are up to 0.64 on the right and 0.81 on the left.  Previously these were 0.54 and 0.72.  She is doing well.  She will continue her Eliquis and statin agent.  We will follow this on an annual basis unless problems develop.  Bilateral carotid artery stenosis Duplex today shows stable 1 to 39% carotid artery stenosis bilaterally without progression from previous studies.  Continue current medical regimen.  Recheck in 1 year.    [name], MD  08/24/2020 3:39 PM    This note was created with Dragon medical transcription system.  Any errors from dictation are purely unintentional

## 2020-08-24 NOTE — Assessment & Plan Note (Signed)
Duplex today shows stable 1 to 39% carotid artery stenosis bilaterally without progression from previous studies.  Continue current medical regimen.  Recheck in 1 year.

## 2020-08-24 NOTE — Assessment & Plan Note (Signed)
Her ABIs today are up to 0.64 on the right and 0.81 on the left.  Previously these were 0.54 and 0.72.  She is doing well.  She will continue her Eliquis and statin agent.  We will follow this on an annual basis unless problems develop.

## 2020-08-30 ENCOUNTER — Inpatient Hospital Stay: Admission: RE | Admit: 2020-08-30 | Payer: Medicare Other | Source: Ambulatory Visit

## 2020-09-05 ENCOUNTER — Observation Stay
Admission: EM | Admit: 2020-09-05 | Discharge: 2020-09-06 | Disposition: A | Discharge status: Home / Self Care | Payer: Medicare Other | Attending: Emergency Medicine | Admitting: Emergency Medicine

## 2020-09-05 ENCOUNTER — Emergency Department: Payer: Medicare Other

## 2020-09-05 ENCOUNTER — Inpatient Hospital Stay: Payer: Medicare Other

## 2020-09-05 ENCOUNTER — Other Ambulatory Visit: Payer: Self-pay

## 2020-09-05 DIAGNOSIS — J9601 Acute respiratory failure with hypoxia: Secondary | ICD-10-CM | POA: Diagnosis not present

## 2020-09-05 DIAGNOSIS — I509 Heart failure, unspecified: Secondary | ICD-10-CM | POA: Insufficient documentation

## 2020-09-05 DIAGNOSIS — N179 Acute kidney failure, unspecified: Secondary | ICD-10-CM | POA: Insufficient documentation

## 2020-09-05 DIAGNOSIS — R7401 Elevation of levels of liver transaminase levels: Secondary | ICD-10-CM | POA: Diagnosis not present

## 2020-09-05 DIAGNOSIS — Z8673 Personal history of transient ischemic attack (TIA), and cerebral infarction without residual deficits: Secondary | ICD-10-CM | POA: Diagnosis not present

## 2020-09-05 DIAGNOSIS — I110 Hypertensive heart disease with heart failure: Secondary | ICD-10-CM | POA: Insufficient documentation

## 2020-09-05 DIAGNOSIS — J9600 Acute respiratory failure, unspecified whether with hypoxia or hypercapnia: Secondary | ICD-10-CM | POA: Diagnosis present

## 2020-09-05 DIAGNOSIS — Z79899 Other long term (current) drug therapy: Secondary | ICD-10-CM | POA: Diagnosis not present

## 2020-09-05 DIAGNOSIS — Z7901 Long term (current) use of anticoagulants: Secondary | ICD-10-CM | POA: Diagnosis not present

## 2020-09-05 DIAGNOSIS — Z87891 Personal history of nicotine dependence: Secondary | ICD-10-CM | POA: Insufficient documentation

## 2020-09-05 DIAGNOSIS — Y900 Blood alcohol level of less than 20 mg/100 ml: Secondary | ICD-10-CM | POA: Diagnosis not present

## 2020-09-05 DIAGNOSIS — F03918 Unspecified dementia, unspecified severity, with other behavioral disturbance: Secondary | ICD-10-CM

## 2020-09-05 DIAGNOSIS — E872 Acidosis, unspecified: Secondary | ICD-10-CM

## 2020-09-05 DIAGNOSIS — I5033 Acute on chronic diastolic (congestive) heart failure: Secondary | ICD-10-CM

## 2020-09-05 DIAGNOSIS — I952 Hypotension due to drugs: Secondary | ICD-10-CM | POA: Diagnosis present

## 2020-09-05 DIAGNOSIS — Z20822 Contact with and (suspected) exposure to covid-19: Secondary | ICD-10-CM | POA: Diagnosis not present

## 2020-09-05 DIAGNOSIS — G309 Alzheimer's disease, unspecified: Secondary | ICD-10-CM | POA: Diagnosis not present

## 2020-09-05 DIAGNOSIS — Z96659 Presence of unspecified artificial knee joint: Secondary | ICD-10-CM | POA: Insufficient documentation

## 2020-09-05 DIAGNOSIS — I480 Paroxysmal atrial fibrillation: Secondary | ICD-10-CM | POA: Diagnosis not present

## 2020-09-05 DIAGNOSIS — F0391 Unspecified dementia with behavioral disturbance: Secondary | ICD-10-CM

## 2020-09-05 DIAGNOSIS — R0602 Shortness of breath: Secondary | ICD-10-CM | POA: Diagnosis present

## 2020-09-05 DIAGNOSIS — I4891 Unspecified atrial fibrillation: Secondary | ICD-10-CM

## 2020-09-05 DIAGNOSIS — R109 Unspecified abdominal pain: Secondary | ICD-10-CM | POA: Insufficient documentation

## 2020-09-05 LAB — CBG MONITORING, ED: Glucose-Capillary: 282 mg/dL — ABNORMAL HIGH (ref 70–99)

## 2020-09-05 LAB — CBC
HCT: 41.7 % (ref 36.0–46.0)
Hemoglobin: 13.3 g/dL (ref 12.0–15.0)
MCH: 30.6 pg (ref 26.0–34.0)
MCHC: 31.9 g/dL (ref 30.0–36.0)
MCV: 96.1 fL (ref 80.0–100.0)
Platelets: 189 10*3/uL (ref 150–400)
RBC: 4.34 MIL/uL (ref 3.87–5.11)
RDW: 13 % (ref 11.5–15.5)
WBC: 9.2 10*3/uL (ref 4.0–10.5)
nRBC: 0 % (ref 0.0–0.2)

## 2020-09-05 LAB — TSH: TSH: 10.759 u[IU]/mL — ABNORMAL HIGH (ref 0.350–4.500)

## 2020-09-05 LAB — COMPREHENSIVE METABOLIC PANEL
ALT: 61 U/L — ABNORMAL HIGH (ref 0–44)
AST: 141 U/L — ABNORMAL HIGH (ref 15–41)
Albumin: 3.9 g/dL (ref 3.5–5.0)
Alkaline Phosphatase: 78 U/L (ref 38–126)
Anion gap: 17 — ABNORMAL HIGH (ref 5–15)
BUN: 13 mg/dL (ref 8–23)
CO2: 22 mmol/L (ref 22–32)
Calcium: 9.4 mg/dL (ref 8.9–10.3)
Chloride: 103 mmol/L (ref 98–111)
Creatinine, Ser: 1.29 mg/dL — ABNORMAL HIGH (ref 0.44–1.00)
GFR, Estimated: 41 mL/min — ABNORMAL LOW (ref >60)
Glucose, Bld: 322 mg/dL — ABNORMAL HIGH (ref 70–99)
Potassium: 4.6 mmol/L (ref 3.5–5.1)
Sodium: 142 mmol/L (ref 135–145)
Total Bilirubin: 1.6 mg/dL — ABNORMAL HIGH (ref 0.3–1.2)
Total Protein: 6.8 g/dL (ref 6.5–8.1)

## 2020-09-05 LAB — URINE DRUG SCREEN, QUALITATIVE (ARMC ONLY)
Amphetamines, Ur Screen: NOT DETECTED
Barbiturates, Ur Screen: NOT DETECTED
Benzodiazepine, Ur Scrn: NOT DETECTED
Cannabinoid 50 Ng, Ur ~~LOC~~: NOT DETECTED
Cocaine Metabolite,Ur ~~LOC~~: NOT DETECTED
MDMA (Ecstasy)Ur Screen: NOT DETECTED
Methadone Scn, Ur: NOT DETECTED
Opiate, Ur Screen: NOT DETECTED
Phencyclidine (PCP) Ur S: NOT DETECTED
Tricyclic, Ur Screen: NOT DETECTED

## 2020-09-05 LAB — TROPONIN I (HIGH SENSITIVITY): Troponin I (High Sensitivity): 8 ng/L (ref <18)

## 2020-09-05 LAB — T4, FREE: Free T4: 1.15 ng/dL — ABNORMAL HIGH (ref 0.61–1.12)

## 2020-09-05 LAB — LACTIC ACID, PLASMA
Lactic Acid, Venous: 3.1 mmol/L (ref 0.5–1.9)
Lactic Acid, Venous: 6.5 mmol/L (ref 0.5–1.9)
Lactic Acid, Venous: 1.4 mmol/L (ref 0.5–1.9)

## 2020-09-05 LAB — AMMONIA: Ammonia: 13 umol/L (ref 9–35)

## 2020-09-05 LAB — LIPASE, BLOOD: Lipase: 33 U/L (ref 11–51)

## 2020-09-05 LAB — BRAIN NATRIURETIC PEPTIDE: B Natriuretic Peptide: 317.3 pg/mL — ABNORMAL HIGH (ref 0.0–100.0)

## 2020-09-05 LAB — RESP PANEL BY RT-PCR (FLU A&B, COVID) ARPGX2
Influenza A by PCR: NEGATIVE
Influenza B by PCR: NEGATIVE
SARS Coronavirus 2 by RT PCR: NEGATIVE

## 2020-09-05 LAB — PROCALCITONIN: Procalcitonin: <0.1 ng/mL

## 2020-09-05 LAB — ETHANOL: Alcohol, Ethyl (B): <10 mg/dL (ref <10)

## 2020-09-05 MED ORDER — SODIUM CHLORIDE 0.9 % IV SOLN: 250.0000 mL | INTRAVENOUS | Status: DC | PRN

## 2020-09-05 MED ORDER — SODIUM CHLORIDE 0.9% FLUSH: 3.0000 mL | INTRAVENOUS | Status: DC | PRN

## 2020-09-05 MED ORDER — SODIUM CHLORIDE 0.9% FLUSH
3.0000 mL | Freq: Two times a day (BID) | INTRAVENOUS | Status: DC
  Administered 2020-09-06: 3 mL via INTRAVENOUS

## 2020-09-05 MED ORDER — ONDANSETRON HCL 4 MG/2ML IJ SOLN: 4.0000 mg | Freq: Four times a day (QID) | INTRAMUSCULAR | Status: DC | PRN

## 2020-09-05 MED ORDER — ACETAMINOPHEN 325 MG PO TABS: 650.0000 mg | ORAL_TABLET | Freq: Four times a day (QID) | ORAL | Status: DC | PRN

## 2020-09-05 MED ORDER — LORAZEPAM 2 MG/ML IJ SOLN
0.5000 mg | Freq: Once | INTRAMUSCULAR | Status: AC
  Administered 2020-09-05: 0.5 mg via INTRAVENOUS
  Filled 2020-09-05: qty 1

## 2020-09-05 MED ORDER — HYDROCODONE-ACETAMINOPHEN 5-325 MG PO TABS: 1.0000 | ORAL_TABLET | Freq: Four times a day (QID) | ORAL | Status: DC | PRN

## 2020-09-05 MED ORDER — ACETAMINOPHEN 325 MG PO TABS: 650.0000 mg | ORAL_TABLET | ORAL | Status: DC | PRN

## 2020-09-05 MED ORDER — HALOPERIDOL LACTATE 5 MG/ML IJ SOLN
1.0000 mg | Freq: Once | INTRAMUSCULAR | Status: AC
  Administered 2020-09-05: 1 mg via INTRAVENOUS
  Filled 2020-09-05: qty 1

## 2020-09-05 MED ORDER — HALOPERIDOL LACTATE 5 MG/ML IJ SOLN
1.0000 mg | Freq: Four times a day (QID) | INTRAMUSCULAR | Status: AC | PRN
  Administered 2020-09-06: 1 mg via INTRAVENOUS
  Filled 2020-09-05: qty 1

## 2020-09-05 MED ORDER — LORAZEPAM 2 MG/ML IJ SOLN: 0.5000 mg | Freq: Once | INTRAMUSCULAR | Status: DC

## 2020-09-05 MED ORDER — FUROSEMIDE 10 MG/ML IJ SOLN
20.0000 mg | Freq: Two times a day (BID) | INTRAMUSCULAR | Status: DC
  Administered 2020-09-06: 20 mg via INTRAVENOUS
  Filled 2020-09-05: qty 4

## 2020-09-05 MED ORDER — APIXABAN 5 MG PO TABS
5.0000 mg | ORAL_TABLET | Freq: Two times a day (BID) | ORAL | Status: DC
  Administered 2020-09-06: 5 mg via ORAL
  Filled 2020-09-05: qty 1

## 2020-09-05 MED ORDER — FUROSEMIDE 10 MG/ML IJ SOLN
40.0000 mg | Freq: Once | INTRAMUSCULAR | Status: AC
  Administered 2020-09-05: 40 mg via INTRAVENOUS
  Filled 2020-09-05: qty 4

## 2020-09-05 NOTE — ED Notes (Signed)
Patient now sedated, sleeping but can be aroused. Purewick in place. Hooked back up to the monitor.

## 2020-09-05 NOTE — ED Notes (Signed)
Contacted Pharmacy to do Medication reconciliation. They will call daughter who is in room.

## 2020-09-05 NOTE — ED Notes (Signed)
Pt confused stating that she is supposed to have PT at 1000 and that she needs to go to church and keeps repeating "I don't feel good"
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
## 2020-09-05 NOTE — ED Triage Notes (Signed)
Pt arrives via EMS from home after calling her daughter at 1300 for abdominal pain that started around 0800- pt has a recent diagnosis of CHF and was placed on 4L [place] by EMS after getting a RA sat of 88%

## 2020-09-05 NOTE — ED Provider Notes (Signed)
[facility] Emergency Department Provider Note  ____________________________________________   Event Date/Time   First MD Initiated Contact with Patient 09/05/20 1501     (approximate)  I have reviewed the triage vital signs and the nursing notes.   HISTORY  Chief Complaint Abdominal Pain    HPI Ashley Hill is a 86 y.o. female with atrial fibrillation, CHF, dementia who comes in for altered mental status.  Patient is on Eliquis.  Patient comes from home after her daughter was called patient complaining of shortness of breath (NOT ABDOMINAL PAIN according to CC) that started around 8 AM.  When EMS got there they reported sats of 88% patient was placed on 4 L although it is very difficult to get a good pulse ox given patient does have slightly cold extremities.  Patient states that she is not sure why she is here.  She denies any chest pain, shortness of breath, abdominal pain.  She is able to squeeze bilateral hands and move bilateral legs.  She knows that she is in the hospital but does not know the year.  Patient appears confused and I am unable to get full HPI due to patient's altered mental status.          Past Medical History:  Diagnosis Date  . Arrhythmia    atrial fibrillation  . CHF (congestive heart failure) ([place])   . Dementia ([place])   . Dyslipidemia   . Dysrhythmia   . Ectopic atrial tachycardia ([place])   . Environmental allergies   . H/O scarlet fever   . Heart murmur    IN PAST  . HOH (hard of hearing)   . Hyperlipidemia   . Hypertension   . Peripheral vascular disease ([place])   . Seizure disorder ([place])   . 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 Arnold Long, [place].  . Transient cerebral ischemia     Patient Active Problem List   Diagnosis Date Noted  . Calculus of bile duct with acute cholecystitis and obstruction 06/28/2020  . Chronic diastolic CHF (congestive heart  failure), NYHA class 2 ([place]) 05/04/2020  . Acute CHF (congestive heart failure) ([place]) 04/30/2020  . TIA (transient ischemic attack) 04/30/2020  . Acute respiratory failure ([place]) 04/30/2020  . Persistent atrial fibrillation ([place]) 04/19/2020  . Abnormal glucose 10/17/2018  . Abnormal gait 07/15/2018  . History of radiofrequency ablation (RFA) procedure for cardiac arrhythmia 07/15/2018  . Knee pain 07/15/2018  . Knee stiff 07/15/2018  . B12 deficiency 06/14/2018  . Seizure disorder ([place]) 05/22/2017  . PAD (peripheral artery disease) ([place]) 03/23/2017  . Bilateral carotid artery stenosis 03/23/2017  . Atherosclerosis of artery of extremity with rest pain ([place]) 08/07/2016  . Dementia in Alzheimer's disease ([place]) 07/26/2016  . Alcohol abuse 07/26/2016  . Ischemic leg 07/25/2016  . Pain in limb 07/21/2016  . Closed fracture dislocation of right shoulder joint, initial encounter 04/10/2016  . Hypotension due to drugs 03/08/2016  . Atherosclerosis of native arteries of extremity with intermittent claudication ([place]) 06/30/2015  . Polymyalgia rheumatica ([place]) 10/27/2014  . Atrioventricular nodal re-entry tachycardia ([place]) 10/05/2014  . Lesion of right parietal bone 01/27/2014  . Left sided numbness 01/05/2014  . Medicare annual wellness visit, initial 12/09/2013  . Bleeding 12/09/2013  . Dizziness and giddiness 12/09/2013  . Preop cardiovascular exam 04/21/2013  . Arthritis, degenerative 10/23/2012  . Chest pain 07/22/2012  . SVT (supraventricular tachycardia) ([place])   .  Hypertension   . Hyperlipidemia     Past Surgical History:  Procedure Laterality Date  . ABLATION OF DYSRHYTHMIC FOCUS    . AUGMENTATION MAMMAPLASTY Bilateral   . CATARACT EXTRACTION W/PHACO Left 08/28/2017   Procedure: CATARACT EXTRACTION PHACO AND INTRAOCULAR LENS PLACEMENT (IOC);  Surgeon: Birder Robson, MD;  Location: [facility] ORS;  Service: Ophthalmology;  Laterality: Left;  [place] 00:26 AP% 18.4 CDE 4.85 Fluid pa ck  lot # [id number]  . CORONARY ANGIOPLASTY    . ENDARTERECTOMY FEMORAL Right 08/07/2016   Procedure: ENDARTERECTOMY FEMORAL;  Surgeon: Algernon Huxley, MD;  Location: [facility] ORS;  Service: Vascular;  Laterality: Right;  . ERCP N/A 08/07/2019   Procedure: ENDOSCOPIC RETROGRADE CHOLANGIOPANCREATOGRAPHY (ERCP);  Surgeon: [name], MD;  Location: [facility] ENDOSCOPY;  Service: Endoscopy;  Laterality: N/A;  . EYE SURGERY    . JOINT REPLACEMENT     knee  . KNEE ARTHROSCOPY W/ MENISCAL REPAIR     Right   . PERIPHERAL VASCULAR BALLOON ANGIOPLASTY N/A 07/26/2016   Procedure: Peripheral Vascular Balloon Angioplasty;  Surgeon: Algernon Huxley, MD;  Location: [place] [facility] LAB;  Service: Cardiovascular;  Laterality: N/A;  . PERIPHERAL VASCULAR CATHETERIZATION Right 01/11/2015   Procedure: Lower Extremity Angiography;  Surgeon: Algernon Huxley, MD;  Location: [place] [facility] LAB;  Service: Cardiovascular;  Laterality: Right;  . PERIPHERAL VASCULAR CATHETERIZATION Right 06/21/2015   Procedure: Lower Extremity Angiography;  Surgeon: Algernon Huxley, MD;  Location: [place] [facility] LAB;  Service: Cardiovascular;  Laterality: Right;  . PERIPHERAL VASCULAR CATHETERIZATION Right 06/21/2015   Procedure: Lower Extremity Intervention;  Surgeon: Algernon Huxley, MD;  Location: [place] [facility] LAB;  Service: Cardiovascular;  Laterality: Right;  . PERIPHERAL VASCULAR CATHETERIZATION Right 10/12/2015   Procedure: Lower Extremity Angiography;  Surgeon: Algernon Huxley, MD;  Location: [place] [facility];  Service: Cardiovascular;  Laterality: Right;  . PERIPHERAL VASCULAR CATHETERIZATION Right 10/12/2015   Procedure: Lower Extremity Intervention;  Surgeon: Algernon Huxley, MD;  Location: [place] CV LAB;  Service: Cardiovascular;  Laterality: Right;  . PERIPHERAL VASCULAR CATHETERIZATION Left 02/10/2016   Procedure: Lower Extremity Angiography;  Surgeon: Algernon Huxley, MD;  Location: [place] [facility];  Service: Cardiovascular;  Laterality: Left;  .  PERIPHERAL VASCULAR CATHETERIZATION  02/10/2016   Procedure: Lower Extremity Intervention;  Surgeon: Algernon Huxley, MD;  Location: [place] [facility];  Service: Cardiovascular;;  . PERIPHERAL VASCULAR CATHETERIZATION Right 02/24/2016   Procedure: Lower Extremity Angiography;  Surgeon: Algernon Huxley, MD;  Location: [place] [facility];  Service: Cardiovascular;  Laterality: Right;  . SHOULDER CLOSED REDUCTION Right 04/10/2016   Procedure: CLOSED REDUCTION SHOULDER;  Surgeon: Hessie Knows, MD;  Location: [facility] ORS;  Service: Orthopedics;  Laterality: Right;  . TONSILLECTOMY    . TOTAL KNEE ARTHROPLASTY  2015  . VAGINAL HYSTERECTOMY      Prior to Admission medications   Medication Sig Start Date End Date Taking? Authorizing Provider  acetaminophen (TYLENOL) 500 MG tablet Take 500 mg by mouth every 6 (six) hours as needed for moderate pain or headache.    [provider]  Alpha-Lipoic Acid 600 MG CAPS Take 600 mg by mouth daily. Patient not taking: No sig reported    [provider]  calcium carbonate (OSCAL) 1500 (600 Ca) MG TABS tablet Take 600 mg of elemental calcium by mouth in the morning and at bedtime. Patient not taking: No sig reported    [provider]  carvedilol (COREG) 25  MG tablet Take 25 mg by mouth 2 (two) times daily with a meal.    [provider]  Cholecalciferol (D3-1000) 25 MCG (1000 UT) capsule Take 1,000 Units by mouth daily. Patient not taking: No sig reported    [provider]  cloNIDine (CATAPRES) 0.1 MG tablet Take 0.1 mg by mouth 2 (two) times daily. Patient not taking: Reported on 08/24/2020    [provider]  Coenzyme Q10 (COQ10) 50 MG CAPS Take 50 mg by mouth daily. Patient not taking: No sig reported    [provider]  Cyanocobalamin (B-12 COMPLIANCE INJECTION) 1000 MCG/ML KIT Inject 1,000 mcg as directed every 30 (thirty) days.    [provider]  diltiazem (CARDIZEM CD) 240 MG 24 hr capsule  Take 240 mg by mouth daily. 04/19/20   [provider]  donepezil (ARICEPT) 10 MG tablet Take 10 mg by mouth at bedtime.    [provider]  ELIQUIS 5 MG TABS tablet Take 1 tablet by mouth twice a day Patient taking differently: Take 5 mg by mouth 2 (two) times daily. 07/11/19   Kris Hartmann, NP  furosemide (LASIX) 40 MG tablet Take 1 tablet (40 mg total) by mouth daily. 05/03/20 06/02/20  Wyvonnia Dusky, MD  lidocaine (LIDODERM) 5 % Place 1 patch onto the skin daily. Remove & Discard patch within 12 hours or as directed by MD Patient not taking: No sig reported 02/05/20   Laban Emperor, PA-C  potassium chloride (KLOR-CON) 10 MEQ tablet Take 2 tablets (20 mEq total) by mouth daily. 05/02/20 06/01/20  Wyvonnia Dusky, MD  rosuvastatin (CRESTOR) 10 MG tablet Take 10 mg by mouth daily.    [provider]  TURMERIC PO Take 125 mg by mouth daily. Patient not taking: No sig reported    [provider]  ZINC-VITAMIN C PO Take by mouth. Patient not taking: No sig reported    [provider]    Allergies Penicillins and Lipitor [atorvastatin]  Family History  Problem Relation Age of Onset  . Alcohol abuse Father   . Cancer Father   . Diabetes Paternal Grandfather   . Breast cancer Daughter 26    Social History Social History   Tobacco Use  . Smoking status: Former Smoker    Packs/day: 1.00    Years: 10.00    Pack years: 10.00    Types: Cigarettes    Quit date: 02/23/1981    Years since quitting: 39.5  . Smokeless tobacco: Never Used  Vaping Use  . Vaping Use: Never used  Substance Use Topics  . Alcohol use: No    Comment: rare occ  . Drug use: No      Review of Systems Patient denies any concerns but her think her history is limited due to altered mental status ____________________________________________   PHYSICAL EXAM:  VITAL SIGNS: ED Triage Vitals  Enc Vitals Group     BP 09/05/20 1445 (!) 87/52     Pulse Rate  09/05/20 1445 (!) 54     Resp 09/05/20 1445 (!) 28     Temp 09/05/20 1448 (!) 97.5 F (36.4 C)     Temp Source 09/05/20 1448 Rectal     SpO2 09/05/20 1445 95 %     Weight 09/05/20 1441 142 lb 3.2 oz (64.5 kg)     Height 09/05/20 1441 _0  (1.702 m)     Head Circumference --      Peak Flow --  Pain Score --      Pain Loc --      Pain Edu? --      Excl. in [place]? --     Constitutional: Alert and oriented x2.  Patient appears confused  eyes: Conjunctivae are normal. EOMI. Head: Atraumatic. Nose: No congestion/rhinnorhea. Mouth/Throat: Mucous membranes are moist.   Neck: No stridor. Trachea Midline. FROM Cardiovascular: Irregular, bradycardic grossly normal heart sounds.  Good peripheral circulation. Respiratory: Normal respiratory effort.  No retractions. Lungs CTAB. Gastrointestinal: Soft and nontender. No distention. No abdominal bruits.  Musculoskeletal: No lower extremity tenderness nor edema.  No joint effusions. Neurologic:  Normal speech and language. No gross focal neurologic deficits are appreciated.  Patient able to squeeze bilateral hands and pick up bilateral legs up off the bed.  Patient frequently appears confused Skin:  Skin is warm, dry and intact. No rash noted. Psychiatric: Mood and affect are normal. Speech and behavior are normal.  Patient does appear confused GU: Deferred   ____________________________________________   LABS (all labs ordered are listed, but only abnormal results are displayed)  Labs Reviewed  COMPREHENSIVE METABOLIC PANEL - Abnormal; Notable for the following components:      Result Value   Glucose, Bld 322 (*)    Creatinine, Ser 1.29 (*)    AST 141 (*)    ALT 61 (*)    Total Bilirubin 1.6 (*)    GFR, Estimated 41 (*)    Anion gap 17 (*)    All other components within normal limits  LACTIC ACID, PLASMA - Abnormal; Notable for the following components:   Lactic Acid, Venous 6.5 (*)    All other components within normal limits   LACTIC ACID, PLASMA - Abnormal; Notable for the following components:   Lactic Acid, Venous 3.1 (*)    All other components within normal limits  BRAIN NATRIURETIC PEPTIDE - Abnormal; Notable for the following components:   B Natriuretic Peptide 317.3 (*)    All other components within normal limits  TSH - Abnormal; Notable for the following components:   TSH 10.759 (*)    All other components within normal limits  T4, FREE - Abnormal; Notable for the following components:   Free T4 1.15 (*)    All other components within normal limits  CBG MONITORING, ED - Abnormal; Notable for the following components:   Glucose-Capillary 282 (*)    All other components within normal limits  RESP PANEL BY RT-PCR (FLU A&B, COVID) ARPGX2  CULTURE, BLOOD (ROUTINE X 2)  CULTURE, BLOOD (ROUTINE X 2)  LIPASE, BLOOD  CBC  AMMONIA  ETHANOL  URINE DRUG SCREEN, QUALITATIVE ([facility] ONLY)  PROCALCITONIN  URINALYSIS, COMPLETE (UACMP) WITH MICROSCOPIC  PROCALCITONIN  LACTIC ACID, PLASMA  LACTIC ACID, PLASMA  TROPONIN I (HIGH SENSITIVITY)   ____________________________________________   ED ECG REPORT I, Vanessa Utting, the attending physician, personally viewed and interpreted this ECG.  A. fib rate of 57, no ST elevations, T wave version 2 3, occasional PVCs. ____________________________________________  RADIOLOGY Robert Bellow, personally viewed and evaluated these images (plain radiographs) as part of my medical decision making, as well as reviewing the written report by the radiologist.  ED MD interpretation: Bilateral opacifications  Official radiology report(s): DG Chest Portable 1 View  Result Date: 09/05/2020 CLINICAL DATA:  Shortness of breath EXAM: PORTABLE CHEST 1 VIEW COMPARISON:  April 30, 2020 FINDINGS: Borderline cardiomegaly. Probable trace right pleural effusion. Basilar and perihilar predominant interstitial opacities. Aortic atherosclerosis. The visualized skeletal  structures  are unchanged. IMPRESSION: 1. Borderline cardiomegaly with bibasilar and perihilar predominant interstitial opacities, likely edema. 2. Probable trace right pleural effusion. Electronically Signed   By: Dahlia Bailiff MD   On: 09/05/2020 15:34    ____________________________________________   PROCEDURES  Procedure(s) performed (including Critical Care):  .1-3 Lead EKG Interpretation Performed by: Vanessa [place], MD Authorized by: Vanessa [place], MD     Interpretation: abnormal     ECG rate:  50s   ECG rate assessment: bradycardic     Rhythm: atrial fibrillation     Ectopy: PVCs     Conduction: normal    .Critical Care Performed by: Vanessa [place], MD Authorized by: Vanessa , MD   Critical care provider statement:    Critical care time (minutes):  45   Critical care was necessary to treat or prevent imminent or life-threatening deterioration of the following conditions:  Respiratory failure   Critical care was time spent personally by me on the following activities:  Discussions with consultants, evaluation of patient's response to treatment, examination of patient, ordering and performing treatments and interventions, ordering and review of laboratory studies, ordering and review of radiographic studies, pulse oximetry, re-evaluation of patient's condition, obtaining history from patient or surrogate and review of old charts     ____________________________________________   INITIAL IMPRESSION / [place] / ED COURSE  Ashley Hill was evaluated in Emergency Department on 09/05/2020 for the symptoms described in the history of present illness. She was evaluated in the context of the global COVID-19 pandemic, which necessitated consideration that the patient might be at risk for infection with the SARS-CoV-2 virus that causes COVID-19. Institutional protocols and algorithms that pertain to the evaluation of patients at risk for COVID-19 are in a state of rapid change  based on information released by regulatory bodies including the CDC and federal and state organizations. These policies and algorithms were followed during the patient's care in the ED.    Patient is a 86 year old who comes in hypotensive and confused.  Will get labs to evaluate for [place] MIs, AKI, UTI, ethanol abuse, drug abuse although she declines these.  Patient was reportedly hypoxic in the field was difficult to get a good pulse ox reading.  We will try placing a pulse ox on her nose get a chest x-ray and BNP to evaluate for pulmonary edema.  Possible patient is been over diuresed and that could be causing her hypotension.  Patient is in atrial fibrillation which looks like she has a diagnosis of this but her rates are slower than normal.  Potentially patient may have taken too many medications.  Her abdomen at this time is soft and nontender and she denies any abdominal pain and daughter denies her ever complaining of this contrary to chief complaint  Labs were notable for significantly elevated lactate but there is no other signs of infection.  She is afebrile, no white count and no other sirs criteria.  Patient was given 500 cc of fluid and her blood pressure came up.  However patient remains on oxygen and chest x-ray concerning for pulmonary edema.  Clearly patient has a very difficult picture with fluid balance given the hypotension with the pulmonary edema.  I discussed with the daughter multiple times about my concern that patient is hypoxic requiring admission to the hospital.  She did get a bed and she quickly desatted and went tachycardic to the 130's.  I do not feel it is a  safe dispo home.  Daughter is very concerned given patient has a history of dementia and does not want to stay.  I offered her some medications to help sedate the patient to help facilitate admission.  After many discussions family is willing to stay in the hospital given a little bit of Ativan and Haldol to help relax the  patient.  I do not think CT imaging would change anything given patient is on Eliquis I will suspicion for pulmonary embolism.  COVID swab is negative.  Her lactate came down with the fluids but at this time do not think antibiotics are necessary.  Will discuss with hospital team for admission for CHF and respiratory failure.  Daughter states that patient is at her baseline mental self         ____________________________________________   FINAL CLINICAL IMPRESSION(S) / ED DIAGNOSES   Final diagnoses:  Acute respiratory failure with hypoxia (HCC)  Acute on chronic congestive heart failure, unspecified heart failure type ([place])      MEDICATIONS GIVEN DURING THIS VISIT:  Medications  LORazepam (ATIVAN) injection 0.5 mg (0.5 mg Intravenous Given 09/05/20 1947)  haloperidol lactate (HALDOL) injection 1 mg (1 mg Intravenous Given 09/05/20 1944)  furosemide (LASIX) injection 40 mg (40 mg Intravenous Given 09/05/20 1947)     ED Discharge Orders    None       Note:  This document was prepared using Dragon voice recognition software and may include unintentional dictation errors.   Vanessa [place], MD 09/05/20 2012

## 2020-09-05 NOTE — ED Notes (Signed)
Primary RN informed of critical Lactic Acid level of 6.5

## 2020-09-05 NOTE — ED Notes (Signed)
Pt was 88% on RA, placed on 2 L [place] and came up to 92%. Informed primary RN.

## 2020-09-06 DIAGNOSIS — I509 Heart failure, unspecified: Secondary | ICD-10-CM | POA: Diagnosis not present

## 2020-09-06 DIAGNOSIS — F0281 Dementia in other diseases classified elsewhere with behavioral disturbance: Secondary | ICD-10-CM

## 2020-09-06 DIAGNOSIS — G309 Alzheimer's disease, unspecified: Secondary | ICD-10-CM

## 2020-09-06 DIAGNOSIS — I4891 Unspecified atrial fibrillation: Secondary | ICD-10-CM | POA: Diagnosis not present

## 2020-09-06 DIAGNOSIS — J9601 Acute respiratory failure with hypoxia: Secondary | ICD-10-CM

## 2020-09-06 LAB — BLOOD CULTURE ID PANEL (REFLEXED) - BCID2

## 2020-09-06 LAB — BASIC METABOLIC PANEL
Anion gap: 11 (ref 5–15)
BUN: 14 mg/dL (ref 8–23)
CO2: 25 mmol/L (ref 22–32)
Calcium: 9 mg/dL (ref 8.9–10.3)
Chloride: 104 mmol/L (ref 98–111)
Creatinine, Ser: 0.84 mg/dL (ref 0.44–1.00)
GFR, Estimated: >60 mL/min (ref >60)
Glucose, Bld: 86 mg/dL (ref 70–99)
Potassium: 3.7 mmol/L (ref 3.5–5.1)
Sodium: 140 mmol/L (ref 135–145)

## 2020-09-06 LAB — PROCALCITONIN: Procalcitonin: <0.1 ng/mL

## 2020-09-06 MED ORDER — ACETAMINOPHEN 325 MG PO TABS: 650.0000 mg | ORAL_TABLET | Freq: Four times a day (QID) | ORAL | Status: DC | PRN

## 2020-09-06 NOTE — ED Notes (Addendum)
Pt had removed tele and monitor wires and purewick. Monitor replaced and posey bed alarm placed under patient.  Pt sleeping, repositioned self for comfort, opens eyes to verbal stimuli and light touch.

## 2020-09-06 NOTE — Discharge Instructions (Signed)
Low sodium 2 g diet  Keep log of blood pressure at home review with primary care physician.

## 2020-09-06 NOTE — ED Notes (Signed)
Pt is dry, sleepy. Posey alarm remains in place, attempted again to place cardiac monitor, pt fights sleepily and immediately rips leads off.

## 2020-09-06 NOTE — ED Notes (Addendum)
Patient eating breakfast. Fall precautions in place. Call light in reach.

## 2020-09-06 NOTE — Progress Notes (Signed)
PHARMACY - PHYSICIAN COMMUNICATION CRITICAL VALUE ALERT - BLOOD CULTURE IDENTIFICATION (BCID)  Ashley Hill is an 86 y.o. female who presented to [facility] on 09/05/2020 with a chief complaint of abdominal pain  Assessment:  Blood cultures from 4/24 with GPC 1 of 4 bottles, BCID = streptococcus species (NOT grp A or B, or pneumococcus)  Name of physician (or Provider) Contacted: Dr Eliane Decree  Current antibiotics: none  Changes to prescribed antibiotics recommended:  Recommendations accepted by provider - monitor off antibiotics for suspected contaminate  Results for orders placed or performed during the hospital encounter of 09/05/20  Blood Culture ID Panel (Reflexed) (Collected: 09/05/2020  8:27 PM)  Result Value Ref Range   Enterococcus faecalis NOT DETECTED NOT DETECTED   Enterococcus Faecium NOT DETECTED NOT DETECTED   Listeria monocytogenes NOT DETECTED NOT DETECTED   Staphylococcus species NOT DETECTED NOT DETECTED   Staphylococcus aureus (BCID) NOT DETECTED NOT DETECTED   Staphylococcus epidermidis NOT DETECTED NOT DETECTED   Staphylococcus lugdunensis NOT DETECTED NOT DETECTED   Streptococcus species DETECTED (A) NOT DETECTED   Streptococcus agalactiae NOT DETECTED NOT DETECTED   Streptococcus pneumoniae NOT DETECTED NOT DETECTED   Streptococcus pyogenes NOT DETECTED NOT DETECTED   A.calcoaceticus-baumannii NOT DETECTED NOT DETECTED   Bacteroides fragilis NOT DETECTED NOT DETECTED   Enterobacterales NOT DETECTED NOT DETECTED   Enterobacter cloacae complex NOT DETECTED NOT DETECTED   Escherichia coli NOT DETECTED NOT DETECTED   Klebsiella aerogenes NOT DETECTED NOT DETECTED   Klebsiella oxytoca NOT DETECTED NOT DETECTED   Klebsiella pneumoniae NOT DETECTED NOT DETECTED   Proteus species NOT DETECTED NOT DETECTED   Salmonella species NOT DETECTED NOT DETECTED   Serratia marcescens NOT DETECTED NOT DETECTED   Haemophilus influenzae NOT DETECTED NOT DETECTED   Neisseria  meningitidis NOT DETECTED NOT DETECTED   Pseudomonas aeruginosa NOT DETECTED NOT DETECTED   Stenotrophomonas maltophilia NOT DETECTED NOT DETECTED   Candida albicans NOT DETECTED NOT DETECTED   Candida auris NOT DETECTED NOT DETECTED   Candida glabrata NOT DETECTED NOT DETECTED   Candida krusei NOT DETECTED NOT DETECTED   Candida parapsilosis NOT DETECTED NOT DETECTED   Candida tropicalis NOT DETECTED NOT DETECTED   Cryptococcus neoformans/gattii NOT DETECTED NOT DETECTED    Juliette Alcide, PharmD, BCPS.   Work Cell: [phone] 09/06/2020 12:21 PM

## 2020-09-06 NOTE — ED Notes (Addendum)
Patient walking in hallway. Patient guided back into room. Patients daughter [name] states she is taking the patient home. Charge nurse notified. Pts daughter signed the AMA form.

## 2020-09-06 NOTE — ED Notes (Signed)
Pt remains dry. Continues to remove oxygen and monitoring equipment. Equipment and oxygen replaced.
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
## 2020-09-06 NOTE — ED Notes (Signed)
Cardiac monitor placed back on pt for about 5th time since 0300.

## 2020-09-06 NOTE — ED Notes (Signed)
Pt nearly constantly removing monitoring equipment and oxygen. This rn repeatedly into room to place pt back on oxygen and monitoring equipment. Pt reoriented to place and time by this rn. Pt declines offer to urinate at this time. Pt is currently dry.

## 2020-09-06 NOTE — ED Notes (Signed)
Patient is resting comfortably. Call light in reach. Fall precautions in place. Daughter at bedside.

## 2020-09-06 NOTE — ED Notes (Signed)
Report to addison, rn.  

## 2020-09-06 NOTE — ED Notes (Signed)
Pt attempting to leave room.

## 2020-09-06 NOTE — ED Notes (Signed)
Pt refuses to leave cardiac monitor in place. Pt repeatedly removes oxygen and pulse ox. Oxygen placed multiple times. Will continue to monitor. [place] fall alarm pad in place, door opened.
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
## 2020-09-07 ENCOUNTER — Other Ambulatory Visit: Payer: Self-pay | Admitting: *Deleted

## 2020-09-07 NOTE — Patient Outreach (Signed)
[facility]) Care Management  09/07/2020  Ashley Hill 08-05-1934 [id number]  Telephone Assessment-Successful-HF  RN spoke directly with the pt today who provider an update on her ongoing management of care. 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 States she has been doing well with no acute events. Recovering well from her recent ED visit with no additional breathing issues. Plan of care reviewed and discussed with noted updates accordingly. Continue to encouraged pt on managing her symptoms and pt is aware of who to call with any acute symptoms related.   Will update pt's provider accordingly this month for the quarterly update on pt's disposition with [facility] services.  Will follow up next month with ongoing care management services and assist pt with her ongoing management of care related to her HF.  Goals Addressed            This Visit's Progress   . THN-Track and Manage Fluids and Swelling-Heart Failure   On track    Follow Up Date 10/07/2020  Timeframe:  Long-Range Goal Priority:  Medium Start Date:   05/24/2020                          Expected End Date:  7/292022                     - call office if I gain more than 2 pounds in one day or 5 pounds in one week - do ankle pumps when sitting - keep legs up while sitting - track weight in diary - use salt in moderation - watch for swelling in feet, ankles and legs every day - weigh myself daily    Why is this important?    It is important to check your weight daily and watch how much salt and liquids you have.   It will help you to manage your heart failure.    Notes:  4/26-Pt managing her HF with reported her documented weights at baseline of 136 lbs with no fluid retention since the last conversation with the pt's caregiver daughter Ashley Hill. Pt remains in the GREEN zone with no reported symptoms.  1/10-Educated on discussed interventions noted above to  reduce the risk of hospitalization and exacerbation of symptoms. Will send tool (Calender) and encouraged daily documentation of weights for providers to view on monitoring pt's ongoing HF.     . THN-Track and Manage Symptoms-Heart Failure   On track    Follow Up Date 10/07/2020 Timeframe:  Short-Term Goal Priority:  Medium Start Date:   05/24/2020                          Expected End Date:    10/12/2020                   - begin a heart failure diary - develop a rescue plan - follow rescue plan if symptoms flare-up - know when to call the doctor - track symptoms and what helps feel better or worse    Why is this important?    You will be able to handle your symptoms better if you keep track of them.   Making some simple changes to your lifestyle will help.   Eating healthy is one thing you can do to take good care of yourself.    Notes:  4/26-  Pt states she id doing well with no symptoms of her HF and denies any swelling. Clarified for pt the GREEN zone and verified pt remains in the GREEN zone. Pt reports she is aware of what to do with acute symptoms. Verified medication adherence and pt attending all medical appointments with sufficient transportation. 3/29- Primary caregiver reports pt had a recent visit with her provider and Dr. Judithann Sheen was very please with pt and all labs were good. Only added Voltaren gel for neuropathy pain PRN with no other changes in her medications.Pt continue to weight daily and remain around her baseline weight 140 lbs with no related symptoms or fluid retention. 2/21- POA daughter Ashley Hill indicates pt is doing "great". States pt is ambulating around the block with neighbors and continue to track her weights and aware of who to notify with any symptoms. Utilizing to resources send by this RN case management.       [name], RN Care Management Coordinator [facility] Main Office [phone]

## 2020-09-08 LAB — CULTURE, BLOOD (ROUTINE X 2)

## 2020-09-09 ENCOUNTER — Other Ambulatory Visit: Payer: Self-pay

## 2020-09-09 ENCOUNTER — Ambulatory Visit
Admission: RE | Admit: 2020-09-09 | Discharge: 2020-09-09 | Disposition: A | Discharge status: Home / Self Care | Payer: Medicare Other | Source: Ambulatory Visit | Attending: Internal Medicine | Admitting: Internal Medicine

## 2020-09-09 DIAGNOSIS — Z1231 Encounter for screening mammogram for malignant neoplasm of breast: Secondary | ICD-10-CM

## 2020-09-10 LAB — CULTURE, BLOOD (ROUTINE X 2)
Culture: NO GROWTH
Special Requests: ADEQUATE

## 2020-09-20 ENCOUNTER — Ambulatory Visit: Payer: Medicare Other | Admitting: Family

## 2020-09-20 ENCOUNTER — Telehealth: Payer: Self-pay | Admitting: Family

## 2020-09-20 NOTE — Telephone Encounter (Signed)
Patient did not show for her Heart Failure Clinic appointment on 09/20/20. Will attempt to reschedule.

## 2020-09-20 NOTE — Progress Notes (Deleted)
Patient ID: Ashley Hill, female    DOB: 1935/04/28, 86 y.o.   MRN: [id number]  HPI  Ashley Hill is a 86 y/o female with a history of persistent atrial fibrillation, hyperlipidemia, HTN, SVT, PVD, dementia, seizure disorder, previous tobacco use and chronic heart failure.   Echo report from 04/30/20 reviewed and showed an EF of 65-70% along with mild LAE and mild/moderate MR.   Was in the ED 09/05/20 due to shortness of breath with AMS. Given IVF due to hypotension. Covid was negative. Cardiology consult obtained. Given lasix and symptoms improved and she was released. Admitted 04/30/20 due to shortness of breath. Cardiology consult obtained. Diuresed ~ 4.4L. Discharged after 2 days.   She presents today for a follow-up visit with a chief complaint of   Past Medical History:  Diagnosis Date  . Arrhythmia    atrial fibrillation  . CHF (congestive heart failure) (HCC)   . Dementia (HCC)   . Dyslipidemia   . Dysrhythmia   . Ectopic atrial tachycardia (HCC)   . Environmental allergies   . H/O scarlet fever   . Heart murmur    IN PAST  . HOH (hard of hearing)   . Hyperlipidemia   . Hypertension   . Peripheral vascular disease (HCC)   . Seizure disorder (HCC)   . 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 Harvie Bridge, [place].  . Transient cerebral ischemia    Past Surgical History:  Procedure Laterality Date  . ABLATION OF DYSRHYTHMIC FOCUS    . AUGMENTATION MAMMAPLASTY Bilateral   . CATARACT EXTRACTION W/PHACO Left 08/28/2017   Procedure: CATARACT EXTRACTION PHACO AND INTRAOCULAR LENS PLACEMENT (IOC);  Surgeon: Galen Manila, MD;  Location: [facility] ORS;  Service: Ophthalmology;  Laterality: Left;  [place] 00:26 AP% 18.4 CDE 4.85 Fluid pa ck lot # [id number]  . CORONARY ANGIOPLASTY    . ENDARTERECTOMY FEMORAL Right 08/07/2016   Procedure: ENDARTERECTOMY FEMORAL;  Surgeon: Annice Needy, MD;  Location: [facility] ORS;  Service: Vascular;   Laterality: Right;  . ERCP N/A 08/07/2019   Procedure: ENDOSCOPIC RETROGRADE CHOLANGIOPANCREATOGRAPHY (ERCP);  Surgeon: Midge Minium, MD;  Location: [facility];  Service: Endoscopy;  Laterality: N/A;  . EYE SURGERY    . JOINT REPLACEMENT     knee  . KNEE ARTHROSCOPY W/ MENISCAL REPAIR     Right   . PERIPHERAL VASCULAR BALLOON ANGIOPLASTY N/A 07/26/2016   Procedure: Peripheral Vascular Balloon Angioplasty;  Surgeon: Annice Needy, MD;  Location: [facility] INVASIVE CV LAB;  Service: Cardiovascular;  Laterality: N/A;  . PERIPHERAL VASCULAR CATHETERIZATION Right 01/11/2015   Procedure: Lower Extremity Angiography;  Surgeon: Annice Needy, MD;  Location: [facility] INVAS[facility] CV LAB;  Service: Cardiovascular;  Laterality: Right;  . PERIPHERAL VASCULAR CATHETERIZATION Right 06/21/2015   Procedure: Lower Extremity Angiography;  Surgeon: Annice Needy, MD;  Location: [facility] IN[facility] CV [facility];  Service: Cardiovascular;  Laterality: Right;  . PERIPHERAL VASCULAR CATHETERIZATION Right 06/21/2015   Procedure: Lower Extremity Intervention;  Surgeon: Annice Needy, MD;  Location: [facility] INV[facility] CV LAB;  Service: Cardiovascular;  Laterality: Right;  . PERIPHERAL VASCULAR CATHETERIZATION Right 10/12/2015   Procedure: Lower Extremity Angiography;  Surgeon: Annice Needy, MD;  Location: [facility] INV[facility] CV [facility];  Service: Cardiovascular;  Laterality: Right;  . PERIPHERAL VASCULAR CATHETERIZATION Right 10/12/2015   Procedure: Lower Extremity Intervention;  Surgeon: Annice Needy, MD;  Location: [facility];  Service: Cardiovascular;  Laterality:  Right;  Marland Kitchen PERIPHERAL VASCULAR CATHETERIZATION Left 02/10/2016   Procedure: Lower Extremity Angiography;  Surgeon: Annice Needy, MD;  Location: [facility];  Service: Cardiovascular;  Laterality: Left;  . PERIPHERAL VASCULAR CATHETERIZATION  02/10/2016   Procedure: Lower Extremity Intervention;  Surgeon: Annice Needy, MD;  Location: [facility];  Service: Cardiovascular;;  . PERIPHERAL  VASCULAR CATHETERIZATION Right 02/24/2016   Procedure: Lower Extremity Angiography;  Surgeon: Annice Needy, MD;  Location: [facility];  Service: Cardiovascular;  Laterality: Right;  . SHOULDER CLOSED REDUCTION Right 04/10/2016   Procedure: CLOSED REDUCTION SHOULDER;  Surgeon: Kennedy Bucker, MD;  Location: [facility] ORS;  Service: Orthopedics;  Laterality: Right;  . TONSILLECTOMY    . TOTAL KNEE ARTHROPLASTY  2015  . VAGINAL HYSTERECTOMY     Family History  Problem Relation Age of Onset  . Alcohol abuse Father   . Cancer Father   . Diabetes Paternal Grandfather   . Breast cancer Daughter 55   Social History   Tobacco Use  . Smoking status: Former Smoker    Packs/day: 1.00    Years: 10.00    Pack years: 10.00    Types: Cigarettes    Quit date: 02/23/1981    Years since quitting: 39.6  . Smokeless tobacco: Never Used  Substance Use Topics  . Alcohol use: No    Comment: rare occ   Allergies  Allergen Reactions  . Penicillins Anaphylaxis and Other (See Comments)    Did it involve swelling of the face/tongue/throat, SOB, or low BP? Yes Did it involve sudden or severe rash/hives, skin peeling, or any reaction on the inside of your mouth or nose? No Did you need to seek medical attention at a hospital or doctor's office? Yes When did it last happen?50 + years If all above answers are "NO", may proceed with cephalosporin use.    . Lipitor [Atorvastatin] Other (See Comments)    Muscle cramps in legs     Review of Systems  Constitutional: Negative for appetite change (eats when hungry) and fatigue.  HENT: Positive for rhinorrhea. Negative for congestion and sore throat.   Eyes: Negative.   Respiratory: Negative for cough and shortness of breath.   Cardiovascular: Negative for chest pain, palpitations and leg swelling.  Gastrointestinal: Negative for abdominal distention and abdominal pain.  Endocrine: Negative.   Genitourinary: Negative.   Musculoskeletal: Negative  for back pain and neck pain.  Skin: Negative.   Allergic/Immunologic: Negative.   Neurological: Negative for dizziness and light-headedness.  Hematological: Negative for adenopathy. Does not bruise/bleed easily.  Psychiatric/Behavioral: Negative for dysphoric mood and sleep disturbance. The patient is not nervous/anxious.      Physical Exam Vitals and nursing note reviewed. Exam conducted with a chaperone present (daughter is with patient).  Constitutional:      Appearance: Normal appearance.  HENT:     Head: Normocephalic and atraumatic.  Cardiovascular:     Rate and Rhythm: Tachycardia present. Rhythm irregular.  Pulmonary:     Effort: No respiratory distress.     Breath sounds: No wheezing or rales.  Abdominal:     General: There is no distension.     Palpations: Abdomen is soft.     Tenderness: There is no abdominal tenderness.  Musculoskeletal:        General: No tenderness.     Cervical back: Normal range of motion and neck supple.     Right lower leg: No edema.     Left  lower leg: No edema.  Skin:    General: Skin is warm and dry.  Neurological:     Mental Status: She is alert and oriented to person, place, and time. Mental status is at baseline.  Psychiatric:        Mood and Affect: Mood normal.        Behavior: Behavior normal.    Assessment & Plan:  1: Chronic heart failure with preserved ejection fraction with structural changes (LAE)- - NYHA class I - euvolemic today - not weighing daily but thinks she may have scales at home; instructed to weigh every morning, write the weight down and call for an overnight weight gain of > 2 pounds or a weekly weight gain of > 5 pounds - weight 143 pounds from last visit here 4 months ago - not adding salt but says that she really only eats when she's hungry - trying to be active and doing some walking with her cane - just took her medications when she was in the exam room - BNP 09/05/20 was 317.3  2: HTN- - BP  - saw  PCP ([facility]) 08/10/20 - BMP 09/06/20 reviewed and showed sodium 140, potassium 3.7, creatinine 0.84 and GFR >60  3: Atrial fibrillation- - saw cardiology Gwen Pounds) 07/12/20   4: Dementia- - appears to be stable - using pill box for medications   Medication bottles reviewed.

## 2020-10-14 ENCOUNTER — Other Ambulatory Visit: Payer: Medicare Other | Admitting: *Deleted

## 2020-10-14 NOTE — Patient Outreach (Signed)
[facility]) Care Management  10/14/2020  Ashley Hill Sep 30, 1934 [id number]   Telephone Assessment-Unsuccessful  RN attempted outreach call however unsuccessful. RN was not able to leave a voice message at this time. Will outreach attempts to reach pt's caregiver for update with a follow up call next week.  [name], RN Care Management Coordinator [facility] Main Office [phone]

## 2020-10-19 ENCOUNTER — Other Ambulatory Visit: Payer: Self-pay | Admitting: *Deleted

## 2020-10-19 NOTE — Patient Outreach (Signed)
[facility]) Care Management  10/19/2020  Ashley Hill March 25, 1935 [id number]  Telephone Assessment-Successful-HF  RN spoke with daughter [name] who provided an update on pt's ongoing management of care. States she now has a regimen provided by the pt's CAD provider to give pt when she is in the beginning stages of distress with her breathing. This regimen prevents pt for going to the ED and has helped pt on the last episode a few weeks ago. No additional issues as pt remains in the GREEN zone with her symptoms at this time and weights lingering around 139 lbs with no swelling or related issues.  Will follow up next month with pt's ongoing management of care and update provider at that time on pt's management of care with [facility] services. Plan of care reviewed and discussed with noted updates within the plan of care. Will continue to encourage adherence.   Goals Addressed            This Visit's Progress   . THN-Track and Manage Fluids and Swelling-Heart Failure   On track    Follow Up Date 11/24/2020  Timeframe:  Long-Range Goal Priority:  Medium Start Date:   05/24/2020                          Expected End Date:  7/292022                     - call office if I gain more than 2 pounds in one day or 5 pounds in one week - do ankle pumps when sitting - keep legs up while sitting - track weight in diary - use salt in moderation - watch for swelling in feet, ankles and legs every day - weigh myself daily   Barriers: Health Behaviors  Why is this important?    It is important to check your weight daily and watch how much salt and liquids you have.   It will help you to manage your heart failure.    Notes:  4/26-Pt managing her HF with reported her documented weights at baseline of 136 lbs with no fluid retention since the last conversation with the pt's caregiver daughter [name]. Pt remains in the GREEN zone with no reported symptoms.  1/10-Educated on discussed  interventions noted above to reduce the risk of hospitalization and exacerbation of symptoms. Will send tool (Calender) and encouraged daily documentation of weights for providers to view on monitoring pt's ongoing HF.     . THN-Track and Manage Symptoms-Heart Failure   On track    Follow Up Date 11/24/2020 Timeframe:  Short-Term Goal Priority:  Medium Start Date:   05/24/2020                          Expected End Date:    10/12/2020                   - begin a heart failure diary - develop a rescue plan - follow rescue plan if symptoms flare-up - know when to call the doctor - track symptoms and what helps feel better or worse   Barriers: Health Behaviors  Why is this important?    You will be able to handle your symptoms better if you keep track of them.   Making some simple changes to your lifestyle will help.   Eating healthy is one thing you can  do to take good care of yourself.    Notes:  6/7- Spoke with the primary caregiver daughter [name] who indicated pt had a ED visit in April for breathing issues and this was very distressful for the pt. Therefore pt's CAD provided a plan if this occurs again. If  pt starts to have respiratory distress to provider requested pt to take  "an extra fluid pill and O2". States pt had another episode a few weeks ago and instead of going to the hospital where pt gets very stressed the daughter provided the recommended care and pt recovered in a few hours. Will continue to encouraged adherence to the recommended plan for urent needs however daughter aware of when to seek emergency care for this pt if needed. 4/26- Pt states she id doing well with no symptoms of her HF and denies any swelling. Clarified for pt the GREEN zone and verified pt remains in the GREEN zone. Pt reports she is aware of what to do with acute symptoms. Verified medication adherence and pt attending all medical appointments with sufficient transportation. 3/29- Primary caregiver reports  pt had a recent visit with her provider and Dr. Judithann Sheen was very please with pt and all labs were good. Only added Voltaren gel for neuropathy pain PRN with no other changes in her medications.Pt continue to weight daily and remain around her baseline weight 140 lbs with no related symptoms or fluid retention. 2/21- POA daughter [name] indicates pt is doing "great". States pt is ambulating around the block with neighbors and continue to track her weights and aware of who to notify with any symptoms. Utilizing to resources send by this RN case management.       [name], RN Care Management Coordinator [facility] Main Office [phone]

## 2020-10-20 NOTE — Patient Outreach (Signed)
[facility] Customer service manager [facility]) Care Management  10/20/2020  Ashley Hill [birth year] [id number]  Referral for pharmacy assistance from [name], RN sent to [facility].  [name] [facility] Management Assistant [phone]

## 2020-11-08 ENCOUNTER — Telehealth: Payer: Self-pay

## 2020-11-08 DIAGNOSIS — Z9114 Patient's other noncompliance with medication regimen: Secondary | ICD-10-CM

## 2020-11-08 NOTE — Progress Notes (Signed)
[facility] Customer service manager [facility]                                            [facility] Quality Pharmacy Team    11/08/2020  Ashley Hill [birth year] [id number]                                         [facility])                                            [facility] Quality Pharmacy Team                                        [place] Quality Measure Assessment    11/08/2020  Ashley Hill 08/18/1934 [id number]  Dr. Judithann Sheen,   I am a [facility] clinical pharmacist that received a referral for Ashley Hill for medication assistance for medication non-adherence due to "memory issues". I spoke with the patient's daughter, Ashley Hill, and provided pharmacy options that offer compliance packaging and free delivery. Ashley Hill reports she will have the patient's prescriptions transferred to the pharmacy for compliance packaging.   I provided Ashley Hill with my contact information to follow up with me if there are any issues.    Thank you!   Harlon Flor, PharmD Clinical Pharmacist  [facility] [employer] [phone]

## 2020-11-23 ENCOUNTER — Other Ambulatory Visit: Payer: Self-pay

## 2020-11-24 ENCOUNTER — Encounter: Payer: Self-pay | Admitting: *Deleted

## 2020-12-07 ENCOUNTER — Other Ambulatory Visit: Payer: Self-pay | Admitting: *Deleted

## 2020-12-07 ENCOUNTER — Encounter: Payer: Self-pay | Admitting: *Deleted

## 2020-12-07 NOTE — Patient Outreach (Signed)
[facility]) Care Management  12/07/2020  Ashley Hill 09-18-1934 [id number]  Telephone Assessment-Unsuccessful  RN contacted pt's daughter [name]) however unsuccessful. RN able to leave a HIPAA voice message requesting a call back.   Will attempt another outreach call next week for ongoing [facility] services.  [name], RN Care Management Coordinator [facility] Main Office [phone]

## 2020-12-07 NOTE — Telephone Encounter (Signed)
This encounter was created in error - please disregard.

## 2020-12-09 ENCOUNTER — Other Ambulatory Visit: Payer: Self-pay | Admitting: *Deleted

## 2020-12-09 NOTE — Patient Outreach (Signed)
[facility]) Care Management  12/09/2020  Ashley Hill August 24, 1934 [id number]   Telephone Assessment-Successful-HF  Pt's daughter responded and contacted RN case manager with an update on pt's ongoing management of care. Care plan updated accordingly with no acute issues reported. Pt remains within the GREEN zone with no needs this month.   Will follow up next month and continue to communicate with the pt's provider accordingly.    Goals Addressed             This Visit's Progress    THN-Track and Manage Fluids and Swelling-Heart Failure   On track    Follow Up Date 01/07/2021  Timeframe:  Long-Range Goal Priority:  Medium Start Date:   05/24/2020                          Expected End Date:  03/14/2021                     - call office if I gain more than 2 pounds in one day or 5 pounds in one week - do ankle pumps when sitting - keep legs up while sitting - track weight in diary - use salt in moderation - watch for swelling in feet, ankles and legs every day - weigh myself daily   Barriers: Health Behaviors  Why is this important?   It is important to check your weight daily and watch how much salt and liquids you have.  It will help you to manage your heart failure.    Notes:  7/28-Verified pt doing well and remains in the GREEN zone with no acute symptoms reported by pt's daughter. Will continue to encouraged adherence to lower the risk for HF exacerbation.  4/26-Pt managing her HF with reported her documented weights at baseline of 136 lbs with no fluid retention since the last conversation with the pt's caregiver daughter [name]. Pt remains in the GREEN zone with no reported symptoms.  1/10-Educated on discussed interventions noted above to reduce the risk of hospitalization and exacerbation of symptoms. Will send tool (Calender) and encouraged daily documentation of weights for providers to view on monitoring pt's ongoing HF.      THN-Track and Manage  Symptoms-Heart Failure   On track    Follow Up Date 01/07/2021 Timeframe:  Short-Term Goal Priority:  Medium Start Date:   05/24/2020                          Expected End Date:    01/12/2021                   - begin a heart failure diary - develop a rescue plan - follow rescue plan if symptoms flare-up - know when to call the doctor - track symptoms and what helps feel better or worse   Barriers: Health Behaviors  Why is this important?   You will be able to handle your symptoms better if you keep track of them.  Making some simple changes to your lifestyle will help.  Eating healthy is one thing you can do to take good care of yourself.    Notes:  7/28-Daughter reports pt without symptoms and continues to do well with no acute issues. Continues to have transportation resources and supportive care from friends and family. 6/7- Spoke with the primary caregiver daughter [name] who indicated pt had a ED visit in April  for breathing issues and this was very distressful for the pt. Therefore pt's CAD provided a plan if this occurs again. If  pt starts to have respiratory distress to provider requested pt to take  "an extra fluid pill and O2". States pt had another episode a few weeks ago and instead of going to the hospital where pt gets very stressed the daughter provided the recommended care and pt recovered in a few hours. Will continue to encouraged adherence to the recommended plan for urent needs however daughter aware of when to seek emergency care for this pt if needed. 4/26- Pt states she id doing well with no symptoms of her HF and denies any swelling. Clarified for pt the GREEN zone and verified pt remains in the GREEN zone. Pt reports she is aware of what to do with acute symptoms. Verified medication adherence and pt attending all medical appointments with sufficient transportation. 3/29- Primary caregiver reports pt had a recent visit with her provider and Dr. Judithann Sheen was very please with  pt and all labs were good. Only added Voltaren gel for neuropathy pain PRN with no other changes in her medications.Pt continue to weight daily and remain around her baseline weight 140 lbs with no related symptoms or fluid retention. 2/21- POA daughter [name] indicates pt is doing "great". States pt is ambulating around the block with neighbors and continue to track her weights and aware of who to notify with any symptoms. Utilizing to resources send by this RN case management.         [name], RN Care Management Coordinator [facility] Main Office [phone]

## 2020-12-13 ENCOUNTER — Other Ambulatory Visit: Payer: Self-pay | Admitting: *Deleted

## 2020-12-13 NOTE — Patient Outreach (Signed)
[facility]) Care Management  12/13/2020  Ashley Hill 1935-02-06 [id number]   Care Coordination-Successful  Daughter states pt is current in a hospital in [place] for electrolyte imbalance. Daughter inquiring on [facility]Health arrangements with a local agency. RN inquired on choices as daughter indicated pt has had [facility] in the past. RN contacted [facility] and received contacts for pending services. Provided daughter with fax # for all that is required for [facility] services. Also provided local contact # for [facility] in [place] where the pt resides. Daughter indicated she would call once pt has been discharged from the hospital.  No further inquires or needs at this time.  Will follow up accordingly post-op d/c on pt's ongoing management of care.  [name], RN Care Management Coordinator [facility] Main Office [phone]

## 2020-12-15 ENCOUNTER — Other Ambulatory Visit: Payer: Self-pay | Admitting: *Deleted

## 2020-12-15 NOTE — Patient Outreach (Signed)
[facility]) Care Management  12/15/2020  IGNACIA GENTZLER [birth year] [id number]   Care Coordination  Spoke with pt's daughter concerning her recent discharge from a [facility] hospital indicating all paperwork was sent to pt's primary provider who id recommending SNF placement for pt. RN explained the process maybe not be immediate based upon finding a facility with availability. Daughter also indicates pt has not has any vaccination with no plans to receive the Co-vid vaccination or boosters. Most SNF are requiring all shots including the co-vid boosters. Daughter understandings it may be a waiting list or a facility would be available with no co-vid vaccinations (verbalized an understanding). Pt has pending appointment on Friday with her provider's office. Will determine at this time for [facility] or request the provider to completed an FL2 for SNF if applicable.   [facility] will follow at that time on the final decision and assist further via referral to CSW for placement if pt qualifies for placement at that time.  [name], RN Care Management Coordinator [facility] Main Office [phone]

## 2020-12-16 ENCOUNTER — Emergency Department: Payer: Medicare Other

## 2020-12-16 ENCOUNTER — Other Ambulatory Visit: Payer: Self-pay

## 2020-12-16 ENCOUNTER — Emergency Department
Admission: EM | Admit: 2020-12-16 | Discharge: 2020-12-16 | Disposition: A | Discharge status: Home / Self Care | Payer: Medicare Other | Attending: Emergency Medicine | Admitting: Emergency Medicine

## 2020-12-16 ENCOUNTER — Ambulatory Visit: Payer: Medicare Other | Admitting: *Deleted

## 2020-12-16 DIAGNOSIS — I4891 Unspecified atrial fibrillation: Secondary | ICD-10-CM | POA: Diagnosis not present

## 2020-12-16 DIAGNOSIS — Z955 Presence of coronary angioplasty implant and graft: Secondary | ICD-10-CM | POA: Diagnosis not present

## 2020-12-16 DIAGNOSIS — R4182 Altered mental status, unspecified: Secondary | ICD-10-CM | POA: Insufficient documentation

## 2020-12-16 DIAGNOSIS — Z20822 Contact with and (suspected) exposure to covid-19: Secondary | ICD-10-CM | POA: Diagnosis not present

## 2020-12-16 DIAGNOSIS — G309 Alzheimer's disease, unspecified: Secondary | ICD-10-CM | POA: Diagnosis not present

## 2020-12-16 DIAGNOSIS — R001 Bradycardia, unspecified: Secondary | ICD-10-CM

## 2020-12-16 DIAGNOSIS — I5033 Acute on chronic diastolic (congestive) heart failure: Secondary | ICD-10-CM | POA: Diagnosis not present

## 2020-12-16 DIAGNOSIS — I110 Hypertensive heart disease with heart failure: Secondary | ICD-10-CM | POA: Diagnosis not present

## 2020-12-16 DIAGNOSIS — F0280 Dementia in other diseases classified elsewhere without behavioral disturbance: Secondary | ICD-10-CM | POA: Diagnosis not present

## 2020-12-16 DIAGNOSIS — Z7901 Long term (current) use of anticoagulants: Secondary | ICD-10-CM | POA: Insufficient documentation

## 2020-12-16 DIAGNOSIS — F0390 Unspecified dementia without behavioral disturbance: Secondary | ICD-10-CM

## 2020-12-16 DIAGNOSIS — Z87891 Personal history of nicotine dependence: Secondary | ICD-10-CM | POA: Insufficient documentation

## 2020-12-16 DIAGNOSIS — Z79899 Other long term (current) drug therapy: Secondary | ICD-10-CM | POA: Insufficient documentation

## 2020-12-16 DIAGNOSIS — Z8673 Personal history of transient ischemic attack (TIA), and cerebral infarction without residual deficits: Secondary | ICD-10-CM | POA: Diagnosis not present

## 2020-12-16 DIAGNOSIS — Z96659 Presence of unspecified artificial knee joint: Secondary | ICD-10-CM | POA: Insufficient documentation

## 2020-12-16 LAB — CBC WITH DIFFERENTIAL/PLATELET
Abs Immature Granulocytes: 0.06 10*3/uL (ref 0.00–0.07)
Basophils Absolute: 0 10*3/uL (ref 0.0–0.1)
Basophils Relative: 0 %
Eosinophils Absolute: 0.1 10*3/uL (ref 0.0–0.5)
Eosinophils Relative: 1 %
HCT: 39.4 % (ref 36.0–46.0)
Hemoglobin: 13 g/dL (ref 12.0–15.0)
Immature Granulocytes: 1 %
Lymphocytes Relative: 15 %
Lymphs Abs: 1.1 10*3/uL (ref 0.7–4.0)
MCH: 32.3 pg (ref 26.0–34.0)
MCHC: 33 g/dL (ref 30.0–36.0)
MCV: 98 fL (ref 80.0–100.0)
Monocytes Absolute: 0.6 10*3/uL (ref 0.1–1.0)
Monocytes Relative: 9 %
Neutro Abs: 5.4 10*3/uL (ref 1.7–7.7)
Neutrophils Relative %: 74 %
Platelets: 170 10*3/uL (ref 150–400)
RBC: 4.02 MIL/uL (ref 3.87–5.11)
RDW: 13.2 % (ref 11.5–15.5)
WBC: 7.2 10*3/uL (ref 4.0–10.5)
nRBC: 0.4 % — ABNORMAL HIGH (ref 0.0–0.2)

## 2020-12-16 LAB — COMPREHENSIVE METABOLIC PANEL
ALT: 130 U/L — ABNORMAL HIGH (ref 0–44)
AST: 85 U/L — ABNORMAL HIGH (ref 15–41)
Albumin: 3.6 g/dL (ref 3.5–5.0)
Alkaline Phosphatase: 62 U/L (ref 38–126)
Anion gap: 10 (ref 5–15)
BUN: 19 mg/dL (ref 8–23)
CO2: 25 mmol/L (ref 22–32)
Calcium: 8.6 mg/dL — ABNORMAL LOW (ref 8.9–10.3)
Chloride: 104 mmol/L (ref 98–111)
Creatinine, Ser: 1.12 mg/dL — ABNORMAL HIGH (ref 0.44–1.00)
GFR, Estimated: 49 mL/min — ABNORMAL LOW (ref >60)
Glucose, Bld: 190 mg/dL — ABNORMAL HIGH (ref 70–99)
Potassium: 4.3 mmol/L (ref 3.5–5.1)
Sodium: 139 mmol/L (ref 135–145)
Total Bilirubin: 1.1 mg/dL (ref 0.3–1.2)
Total Protein: 5.8 g/dL — ABNORMAL LOW (ref 6.5–8.1)

## 2020-12-16 LAB — MAGNESIUM: Magnesium: 1.6 mg/dL — ABNORMAL LOW (ref 1.7–2.4)

## 2020-12-16 LAB — BRAIN NATRIURETIC PEPTIDE: B Natriuretic Peptide: 456 pg/mL — ABNORMAL HIGH (ref 0.0–100.0)

## 2020-12-16 LAB — TROPONIN I (HIGH SENSITIVITY)
Troponin I (High Sensitivity): 46 ng/L — ABNORMAL HIGH (ref <18)
Troponin I (High Sensitivity): 29 ng/L — ABNORMAL HIGH (ref <18)

## 2020-12-16 LAB — TSH: TSH: 4.484 u[IU]/mL (ref 0.350–4.500)

## 2020-12-16 LAB — RESP PANEL BY RT-PCR (FLU A&B, COVID) ARPGX2
Influenza A by PCR: NEGATIVE
Influenza B by PCR: NEGATIVE
SARS Coronavirus 2 by RT PCR: NEGATIVE

## 2020-12-16 NOTE — ED Notes (Signed)
Attempted to ambulate pt with walker. Pt became very dizzy at this time, and was unable to ambulate. Will notify MD.

## 2020-12-16 NOTE — ED Provider Notes (Signed)
Patient received in signout from Dr. Antoine Primas pending repeat troponin and blood work.  Briefly, patient has had episodic altered mentation and dizziness, concerning for symptomatic rated cardia as she was found in a sinus bradycardia by EMS today.  While in the ED on telemetry she has had heart rates as low as the mid 40s, but largely stayed in the mid 50s/low 60s.  She has been pleasantly demented and offers no complaints.  While she became dizzy after standing from a supine position, after sitting for a while, I stand her back up and she ambulates with a walker quite well.  Daughters are reassured by this.  I discussed plan of care with the daughters and they would prefer to attempt outpatient management at this time.  They have an appointment tomorrow morning with her PCP and as indicated by Dr. Antoine Primas, patient can be seen by Dr. Gwen Pounds (cardiology) tomorrow.  Return precautions were discussed prior to discharge.   Clinical Course as of 12/16/20 1826  Thu Dec 16, 2020  1721 Reassessed and discussed plan of care with patient's daughter at the bedside.  Shared decision making is planned to perform a second troponin and likely discharge with follow-up tomorrow with Dr. Gwen Pounds.  We discussed the possibility of medical observation admission for symptomatic bradycardia, particularly at the second opponent is higher and patient has further runs of significant bradycardia. [DS]  1824 Reassessed.  Got the patient up and she ambulated with a walker.  Daughters report that she looks much better.  We discussed observation admission versus going home and seeing her PCP and cardiologist tomorrow.  They prefer to go home this evening.  We discussed return precautions. [DS]    Clinical Course User Index [DS] Delton Prairie, MD      Delton Prairie, MD 12/16/20 [phone]

## 2020-12-16 NOTE — ED Provider Notes (Signed)
[facility] Emergency Department Provider Note  ____________________________________________   Event Date/Time   First MD Initiated Contact with Patient 12/16/20 1432     (approximate)  I have reviewed the triage vital signs and the nursing notes.   HISTORY  Chief Complaint Altered Mental Status (Pt had syncopal episode over weekend and became more altered today. Was hypoxic at home for daughters on EMS arrival, hr 33. Given 0.5 mg atropine PTA. Pt alert to person and place.)   HPI Ashley Hill is a 86 y.o. female with a PMH of PVD, HTN, HDL, accessible A. fib on etizolam and Coreg, CHF, dementia, SVT, seizure disorder and TIA as well as recent syncopal episode on 7/30 evaluated outside hospital reportedly patient was found to have low potassium and magnesium who presents via EMS from home after family felt patient was more confused than usual and had a low SPO2 on home pulse ox.  She reportedly had just restarted her Coreg and diltiazem today after being off them for couple days.  Patient states he does not remember exactly what happened this morning.  She denies any current chest pain, nausea, shortness of breath or any other current symptoms although is not completely oriented and unable to provide any additional history regarding recent events.          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 Arnold Long, [place].   Transient cerebral ischemia     Patient Active Problem List   Diagnosis  Date Noted   CHF (congestive heart failure) ([place]) 09/05/2020   Lactic acidosis 09/05/2020   Dementia with behavioral disturbance ([place]) 09/05/2020   Chronic anticoagulation 09/05/2020   Calculus of bile duct with acute cholecystitis and obstruction 06/28/2020   Acute on chronic diastolic CHF (congestive heart failure) ([place]) 05/04/2020   Acute CHF (congestive heart failure) ([place]) 04/30/2020   TIA (transient ischemic attack) 04/30/2020   Acute respiratory failure ([place]) 04/30/2020   Persistent atrial fibrillation ([place]) 04/19/2020   Abnormal glucose 10/17/2018   Abnormal gait 07/15/2018   History of radiofrequency ablation (RFA) procedure for cardiac arrhythmia 07/15/2018   Knee pain 07/15/2018   Knee stiff 07/15/2018   B12 deficiency 06/14/2018   Seizure disorder ([place]) 05/22/2017   PAD (peripheral artery disease) ([place]) 03/23/2017   Bilateral carotid artery stenosis 03/23/2017   Atherosclerosis of artery of extremity with rest pain ([place]) 08/07/2016   Dementia in Alzheimer's disease ([place]) 07/26/2016   Alcohol abuse 07/26/2016   Ischemic leg 07/25/2016   Pain in limb 07/21/2016   Closed fracture dislocation of right shoulder joint, initial encounter 04/10/2016   Hypotension due to drugs 03/08/2016   Atherosclerosis of native arteries of extremity with intermittent claudication ([place]) 06/30/2015   Polymyalgia rheumatica ([place]) 10/27/2014   Atrioventricular nodal re-entry tachycardia ([place]) 10/05/2014   Lesion of right parietal bone 01/27/2014   Left sided numbness 01/05/2014   Medicare  annual wellness visit, initial 12/09/2013   Bleeding 12/09/2013   Dizziness and giddiness 12/09/2013   Preop cardiovascular exam 04/21/2013   Arthritis, degenerative 10/23/2012   Chest pain 07/22/2012   Atrial fibrillation with slow ventricular response ([place])    Hypertension    Hyperlipidemia     Past Surgical History:  Procedure Laterality Date   ABLATION OF DYSRHYTHMIC FOCUS     AUGMENTATION  MAMMAPLASTY Bilateral    CATARACT EXTRACTION W/PHACO Left 08/28/2017   Procedure: CATARACT EXTRACTION PHACO AND INTRAOCULAR LENS PLACEMENT ([place]);  Surgeon: Birder Robson, MD;  Location: [facility] ORS;  Service: Ophthalmology;  Laterality: Left;  [place] 00:26 AP% 18.4 CDE 4.85 Fluid pa ck lot # [id number]   CORONARY ANGIOPLASTY     ENDARTERECTOMY FEMORAL Right 08/07/2016   Procedure: ENDARTERECTOMY FEMORAL;  Surgeon: Algernon Huxley, MD;  Location: [facility] ORS;  Service: Vascular;  Laterality: Right;   ERCP N/A 08/07/2019   Procedure: ENDOSCOPIC RETROGRADE CHOLANGIOPANCREATOGRAPHY (ERCP);  Surgeon: [name], MD;  Location: [facility] ENDOSCOPY;  Service: Endoscopy;  Laterality: N/A;   EYE SURGERY     JOINT REPLACEMENT     knee   KNEE ARTHROSCOPY W/ MENISCAL REPAIR     Right    PERIPHERAL VASCULAR BALLOON ANGIOPLASTY N/A 07/26/2016   Procedure: Peripheral Vascular Balloon Angioplasty;  Surgeon: Algernon Huxley, MD;  Location: [place] CV LAB;  Service: Cardiovascular;  Laterality: N/A;   PERIPHERAL VASCULAR CATHETERIZATION Right 01/11/2015   Procedure: Lower Extremity Angiography;  Surgeon: Algernon Huxley, MD;  Location: [place] CV LAB;  Service: Cardiovascular;  Laterality: Right;   PERIPHERAL VASCULAR CATHETERIZATION Right 06/21/2015   Procedure: Lower Extremity Angiography;  Surgeon: Algernon Huxley, MD;  Location: [place] CV LAB;  Service: Cardiovascular;  Laterality: Right;   PERIPHERAL VASCULAR CATHETERIZATION Right 06/21/2015   Procedure: Lower Extremity Intervention;  Surgeon: Algernon Huxley, MD;  Location: [place] CV LAB;  Service: Cardiovascular;  Laterality: Right;   PERIPHERAL VASCULAR CATHETERIZATION Right 10/12/2015   Procedure: Lower Extremity Angiography;  Surgeon: Algernon Huxley, MD;  Location: [place] CV LAB;  Service: Cardiovascular;  Laterality: Right;   PERIPHERAL VASCULAR CATHETERIZATION Right 10/12/2015   Procedure: Lower Extremity Intervention;  Surgeon: Algernon Huxley, MD;  Location:  [place] CV LAB;  Service: Cardiovascular;  Laterality: Right;   PERIPHERAL VASCULAR CATHETERIZATION Left 02/10/2016   Procedure: Lower Extremity Angiography;  Surgeon: Algernon Huxley, MD;  Location: [place] CV LAB;  Service: Cardiovascular;  Laterality: Left;   PERIPHERAL VASCULAR CATHETERIZATION  02/10/2016   Procedure: Lower Extremity Intervention;  Surgeon: Algernon Huxley, MD;  Location: [place] CV LAB;  Service: Cardiovascular;;   PERIPHERAL VASCULAR CATHETERIZATION Right 02/24/2016   Procedure: Lower Extremity Angiography;  Surgeon: Algernon Huxley, MD;  Location: [place] CV LAB;  Service: Cardiovascular;  Laterality: Right;   SHOULDER CLOSED REDUCTION Right 04/10/2016   Procedure: CLOSED REDUCTION SHOULDER;  Surgeon: Hessie Knows, MD;  Location: [facility] ORS;  Service: Orthopedics;  Laterality: Right;   TONSILLECTOMY     TOTAL KNEE ARTHROPLASTY  2015   VAGINAL HYSTERECTOMY      Prior to Admission medications   Medication Sig Start Date End Date Taking? Authorizing Provider  acetaminophen (TYLENOL) 500 MG tablet Take 500 mg by mouth every 6 (six) hours as needed for moderate pain or headache.    [provider]  carvedilol (COREG) 25 MG tablet Take 25 mg by mouth 2 (two) times daily with a meal.  [provider]  cloNIDine (CATAPRES) 0.1 MG tablet Take 0.1 mg by mouth 2 (two) times daily.    [provider]  Cyanocobalamin (B-12 COMPLIANCE INJECTION) 1000 MCG/ML KIT Inject 1,000 mcg as directed every 30 (thirty) days.    [provider]  diltiazem (CARDIZEM CD) 240 MG 24 hr capsule Take 240 mg by mouth daily. 04/19/20   [provider]  donepezil (ARICEPT) 10 MG tablet Take 10 mg by mouth at bedtime.    [provider]  ELIQUIS 5 MG TABS tablet Take 1 tablet by mouth twice a day Patient taking differently: Take 5 mg by mouth 2 (two) times daily. 07/11/19   Kris Hartmann, NP  furosemide (LASIX) 40 MG tablet Take 1 tablet (40  mg total) by mouth daily. 05/03/20 06/02/20  Wyvonnia Dusky, MD  potassium chloride (KLOR-CON) 10 MEQ tablet Take 2 tablets (20 mEq total) by mouth daily. 05/02/20 06/01/20  Wyvonnia Dusky, MD  rosuvastatin (CRESTOR) 10 MG tablet Take 10 mg by mouth daily.    [provider]    Allergies Penicillins and Lipitor [atorvastatin]  Family History  Problem Relation Age of Onset   Alcohol abuse Father    Cancer Father    Diabetes Paternal Grandfather    Breast cancer Daughter 69    Social History Social History   Tobacco Use   Smoking status: Former    Packs/day: 1.00    Years: 10.00    Pack years: 10.00    Types: Cigarettes    Quit date: 02/23/1981    Years since quitting: 39.8   Smokeless tobacco: Never  Vaping Use   Vaping Use: Never used  Substance Use Topics   Alcohol use: No    Comment: rare occ   Drug use: No    Review of Systems  Review of Systems  Unable to perform ROS: Dementia     ____________________________________________   PHYSICAL EXAM:  VITAL SIGNS: ED Triage Vitals  Enc Vitals Group     BP      Pulse      Resp      Temp      Temp src      SpO2      Weight      Height      Head Circumference      Peak Flow      Pain Score      Pain Loc      Pain Edu?      Excl. in [place]?    Vitals:   12/16/20 1438  BP: (!) 130/92  Pulse: (!) 57  Resp: 18  Temp: 98.2 F (36.8 C)  SpO2: 97%   Physical Exam Vitals and nursing note reviewed.  Constitutional:      General: She is not in acute distress.    Appearance: She is well-developed.  HENT:     Head: Normocephalic and atraumatic.     Right Ear: External ear normal.     Left Ear: External ear normal.     Nose: Nose normal.  Eyes:     Conjunctiva/sclera: Conjunctivae normal.  Cardiovascular:     Rate and Rhythm: Regular rhythm. Bradycardia present.     Heart sounds: No murmur heard. Pulmonary:     Effort: Pulmonary effort is normal. No respiratory distress.     Breath  sounds: Normal breath sounds.  Abdominal:     Palpations: Abdomen is soft.     Tenderness: There is no abdominal  tenderness.  Musculoskeletal:     Cervical back: Neck supple.     Right lower leg: No edema.     Left lower leg: No edema.  Skin:    General: Skin is warm and dry.  Neurological:     Mental Status: She is alert. Mental status is at baseline. She is disoriented and confused.  Psychiatric:        Mood and Affect: Mood normal.    PERRLA.  EOMI.  Patient has symmetric strength in upper and lower extremities.  Sensation intact to light touch to lower extremities. ____________________________________________   LABS (all labs ordered are listed, but only abnormal results are displayed)  Labs Reviewed  CBC WITH DIFFERENTIAL/PLATELET - Abnormal; Notable for the following components:      Result Value   nRBC 0.4 (*)    All other components within normal limits  RESP PANEL BY RT-PCR (FLU A&B, COVID) ARPGX2  COMPREHENSIVE METABOLIC PANEL  MAGNESIUM  TSH  BRAIN NATRIURETIC PEPTIDE  TROPONIN I (HIGH SENSITIVITY)   ____________________________________________  EKG  A. fib with a rate of 59 some artifact in lead I and lead II is low amplitude throughout.  Nonspecific change in the anterior inferior lateral leads. ____________________________________________  RADIOLOGY  ED MD interpretation: X-ray shows stable cardiomegaly without overt edema, focal consolidation, effusion, pneumothorax or other clear acute intrathoracic process.  Official radiology report(s): DG Chest Portable 1 View  Result Date: 12/16/2020 CLINICAL DATA:  Bradycardia. EXAM: PORTABLE CHEST 1 VIEW COMPARISON:  09/05/2020 FINDINGS: Midline trachea. Numerous leads and wires project over the chest. Mild cardiomegaly. Atherosclerosis in the transverse aorta. Mild right hemidiaphragm elevation. No pleural effusion or pneumothorax. Interstitial edema has resolved. Mild right infrahilar volume loss is favored to be  related to atelectasis in the setting of diaphragmatic elevation. IMPRESSION: No acute cardiopulmonary disease. Cardiomegaly without congestive failure. Aortic Atherosclerosis (ICD10-I70.0). Electronically Signed   By: Abigail Miyamoto M.D.   On: 12/16/2020 15:24    ____________________________________________   PROCEDURES  Procedure(s) performed (including Critical Care):  .1-3 Lead EKG Interpretation  Date/Time: 12/16/2020 3:26 PM Performed by: Lucrezia Starch, MD Authorized by: Lucrezia Starch, MD     Interpretation: non-specific     ECG rate assessment: bradycardic     Rhythm: atrial fibrillation     Ectopy: none     ____________________________________________   INITIAL IMPRESSION / ASSESSMENT AND PLAN / ED COURSE      Presents with above to history exam for assessment of some acute on chronic altered mental status in the setting of low blood pressure and heart rate as well as low SPO2 nursing home pulse ox with family this morning.  Patient is unemployed and history and arrival secondary dementia she states she does not know why she is emergency room but denies any acute symptoms.  Per EMS she was bradycardic with heart rate of 37 on arrival which improved to the 50s after 0.5 mg of atropine was given.  Her blood pressure was initially soft as well and improved with 500 cc of normal saline.  On arrival patient's heart rate is 57, BP is 130/92 with otherwise stable vital signs on room air.  She is afebrile.  Was able to speak with her daughter states the patient did resume taking her Coreg and diltiazem 2 days ago when she returned from [place].  She was recently hospitalized for similar episode and was found to have very low electrolytes at that time.  She was hospitalized in [place].  Differential includes possible symptomatic bradycardia subsequent hypotension caused by Coreg and diltiazem restarted today.  Additional differential includes arrhythmia, ACS, metabolic derangements,  anemia, sepsis and thyroid derangements.  No historical or exam features to suggest recent trauma and I have low suspicion for toxic ingestion.  Low suspicion for PE as daughter state patient is fairly consistent with her Eliquis.  Discussed with Dr. Jose Persia on-call cardiologist consulted for symptomatic bradycardia stated if patient was hospitalized he could see her in the morning and if discharged would see in clinic tomorrow but did not advise any other acute interventions at this time.  CBC shows no leukocytosis or acute anemia.  X-ray shows stable cardiomegaly without overt edema, focal consolidation, effusion, pneumothorax or other clear acute intrathoracic process.     ____________________________________________   FINAL CLINICAL IMPRESSION(S) / ED DIAGNOSES  Final diagnoses:  Symptomatic bradycardia  Dementia without behavioral disturbance, unspecified dementia type ([place])    Medications - No data to display   ED Discharge Orders     None        Note:  This document was prepared using Dragon voice recognition software and may include unintentional dictation errors.    Lucrezia Starch, MD 12/16/20 [phone]

## 2020-12-17 ENCOUNTER — Other Ambulatory Visit: Payer: Self-pay | Admitting: *Deleted

## 2020-12-17 NOTE — Patient Outreach (Signed)
[facility]) Care Management  12/17/2020  Ashley Hill 09-16-1934 [id number]   Telephone Assessment-Unsuccessful   RN follow up as planned to inquire if pt's hospital follow up appointments suggest SNF instead of the initial plan or [facility]. Note [facility] information provided to the daughter [name] telephonic for [facility] in [place] along with the fax number and all the required information from the hospital in [place] to provided directly to the agency for pending services. If hospital follow up appointment with Dr. Judithann Sheen office suggest SNF. [facility] will put in a referral to CSW for placement via SNF level of care. Daughter is aware this maybe a long process. In addition to pt meeting the criteria for facility placement.  RN able to leave HIPAA approved voice message requesting a call back to the [facility] for RN case managers coverage for the next week if further assistance is needed.  [name], RN Care Management Coordinator [facility] Main Office [phone]

## 2021-01-07 ENCOUNTER — Other Ambulatory Visit: Payer: Self-pay | Admitting: *Deleted

## 2021-01-07 NOTE — Patient Outreach (Signed)
[facility]) Care Management  01/07/2021  Ashley Hill 1935/03/26 [id number]   Telephone Assessment-Successful-HF  RN spoke with pt's daughter Ashley Hill who provided an update on pt's ongoing management of care. Plan of care discussed with noted updates within the plan of care. Pt without ay related HF symptoms and continues to do well with her ongoing recovery.   Much discussed concerning possible consideration for a higher level of care for ALF. Family viewing several with plans for an overnight stay at [facility] and/or [facility]. RN discussed the process and encouraged both the pt and daughter to visit the facility and inquire directly with the admission coordinator.   Will follow up next month with pt's ongoing management of care and inquire further on pt's process in possible pursuit of future placement.   Goals Addressed             This Visit's Progress    THN-Track and Manage Fluids and Swelling-Heart Failure   On track    Follow Up Date 02/02/2021  Timeframe:  Long-Range Goal Priority:  Medium Start Date:   05/24/2020                          Expected End Date:  03/14/2021                     - call office if I gain more than 2 pounds in one day or 5 pounds in one week - do ankle pumps when sitting - keep legs up while sitting - track weight in diary - use salt in moderation - watch for swelling in feet, ankles and legs every day - weigh myself daily   Barriers: Health Behaviors  Why is this important?   It is important to check your weight daily and watch how much salt and liquids you have.  It will help you to manage your heart failure.    Notes:  7/28-Verified pt doing well and remains in the GREEN zone with no acute symptoms reported by pt's daughter. Will continue to encouraged adherence to lower the risk for HF exacerbation.  4/26-Pt managing her HF with reported her documented weights at baseline of 136 lbs with no fluid retention since  the last conversation with the pt's caregiver daughter Ashley Hill. Pt remains in the GREEN zone with no reported symptoms.  1/10-Educated on discussed interventions noted above to reduce the risk of hospitalization and exacerbation of symptoms. Will send tool (Calender) and encouraged daily documentation of weights for providers to view on monitoring pt's ongoing HF.      THN-Track and Manage Symptoms-Heart Failure   On track    Follow Up Date 02/02/2021 Timeframe:  Short-Term Goal Priority:  Medium Start Date:   05/24/2020                          Expected End Date:    02/11/2021                   - begin a heart failure diary - develop a rescue plan - follow rescue plan if symptoms flare-up - know when to call the doctor - track symptoms and what helps feel better or worse   Barriers: Health Behaviors  Why is this important?   You will be able to handle your symptoms better if you keep track of them.  Making some simple changes to your  lifestyle will help.  Eating healthy is one thing you can do to take good care of yourself.    Notes:  8/26- Reports no acute symptoms related to her HF. Pt's daughter Ashley Hill indicates pt is doing well and remains in the GREEN zone.  Pt is maintaining her weight with no swelling or related symptoms.                    7/28-Daughter reports pt without symptoms and continues to do well with no acute issues. Continues to have transportation resources and supportive care from friends and family. 6/7- Spoke with the primary caregiver daughter Ashley Hill who indicated pt had a ED visit in April for breathing issues and this was very distressful for the pt. Therefore pt's CAD provided a plan if this occurs again. If  pt starts to have respiratory distress to provider requested pt to take  "an extra fluid pill and O2". States pt had another episode a few weeks ago and instead of going to the hospital where pt gets very stressed the daughter provided the recommended care and pt  recovered in a few hours. Will continue to encouraged adherence to the recommended plan for urent needs however daughter aware of when to seek emergency care for this pt if needed. 4/26- Pt states she id doing well with no symptoms of her HF and denies any swelling. Clarified for pt the GREEN zone and verified pt remains in the GREEN zone. Pt reports she is aware of what to do with acute symptoms. Verified medication adherence and pt attending all medical appointments with sufficient transportation. 3/29- Primary caregiver reports pt had a recent visit with her provider and Dr. Judithann Sheen was very please with pt and all labs were good. Only added Voltaren gel for neuropathy pain PRN with no other changes in her medications.Pt continue to weight daily and remain around her baseline weight 140 lbs with no related symptoms or fluid retention. 2/21- POA daughter Ashley Hill indicates pt is doing "great". States pt is ambulating around the block with neighbors and continue to track her weights and aware of who to notify with any symptoms. Utilizing to resources send by this RN case management.         [name], RN Care Management Coordinator [facility] Main Office [phone]

## 2021-02-02 ENCOUNTER — Other Ambulatory Visit: Payer: Self-pay | Admitting: *Deleted

## 2021-02-02 NOTE — Patient Outreach (Signed)
[facility]) Care Management  02/02/2021  PALIN TRISTAN Nov 18, 1934 [id number]   Telephone Assessment-Unsuccessful  RN attempted outreach call today however unsuccessful to pt's daughter [name] due to pt's memory status.  RN left a HIPAA approved voice message requesting a call back.  Will rescheduled another outreach attempt over the next week for updated management of care.  [name], RN Care Management Coordinator [facility] Main Office [phone]

## 2021-02-03 ENCOUNTER — Other Ambulatory Visit: Payer: Self-pay | Admitting: *Deleted

## 2021-02-03 NOTE — Patient Outreach (Signed)
[facility]) Care Management  02/03/2021  Ashley Hill 08-Nov-1934 [id number]  Telephone Assessment-Successful-HF  RN received information from pt's daughter today with update on pt's ongoing management of care. Plan of care updated accordingly with all reported information on pt's progress.   Will follow up next month on pt's ongoing management of care. Note pt recent [place] and continue to recover well with no residual symptoms mentioned at this time.   Goals Addressed             This Visit's Progress    THN-Track and Manage Fluids and Swelling-Heart Failure   On track    Follow Up Date 03/04/2021  Timeframe:  Long-Range Goal Priority:  Medium Start Date:   05/24/2020                          Expected End Date:  03/14/2021                     - call office if I gain more than 2 pounds in one day or 5 pounds in one week - do ankle pumps when sitting - keep legs up while sitting - track weight in diary - use salt in moderation - watch for swelling in feet, ankles and legs every day - weigh myself daily   Barriers: Health Behaviors  Why is this important?   It is important to check your weight daily and watch how much salt and liquids you have.  It will help you to manage your heart failure.    Notes:  7/28-Verified pt doing well and remains in the GREEN zone with no acute symptoms reported by pt's daughter. Will continue to encouraged adherence to lower the risk for HF exacerbation.  4/26-Pt managing her HF with reported her documented weights at baseline of 136 lbs with no fluid retention since the last conversation with the pt's caregiver daughter Ashley Hill. Pt remains in the GREEN zone with no reported symptoms.  1/10-Educated on discussed interventions noted above to reduce the risk of hospitalization and exacerbation of symptoms. Will send tool (Calender) and encouraged daily documentation of weights for providers  to view on monitoring pt's ongoing HF.      THN-Track and Manage Symptoms-Heart Failure   On track    Follow Up Date 03/04/2021 Timeframe:  Short-Term Goal Priority:  Medium Start Date:   05/24/2020                          Expected End Date:    03/14/2021                   - begin a heart failure diary - develop a rescue plan - follow rescue plan if symptoms flare-up - know when to call the doctor - track symptoms and what helps feel better or worse   Barriers: Health Behaviors  Why is this important?   You will be able to handle your symptoms better if you keep track of them.  Making some simple changes to your lifestyle will help.  Eating healthy is one thing you can do to take good care of yourself.    Notes:  9/22-Caregiver reports pt's weights are within her normal baseline of  130-132 lbs with no acute or related symptoms. Taking all medications as prescribed and doing well with no residual symptoms of HF.  8/26- Reports no acute symptoms related to her HF. Pt's daughter Ashley Hill indicates pt is doing well and remains in the GREEN zone.  Pt is maintaining her weight with no swelling or related symptoms.                    7/28-Daughter reports pt without symptoms and continues to do well with no acute issues. Continues to have transportation resources and supportive care from friends and family. 6/7- Spoke with the primary caregiver daughter Ashley Hill who indicated pt had a ED visit in April for breathing issues and this was very distressful for the pt. Therefore pt's CAD provided a plan if this occurs again. If  pt starts to have respiratory distress to provider requested pt to take  "an extra fluid pill and O2". States pt had another episode a few weeks ago and instead of going to the hospital where pt gets very stressed the daughter provided the recommended care and pt recovered in a few hours. Will continue to encouraged adherence to the recommended plan for urent needs however daughter  aware of when to seek emergency care for this pt if needed. 4/26- Pt states she id doing well with no symptoms of her HF and denies any swelling. Clarified for pt the GREEN zone and verified pt remains in the GREEN zone. Pt reports she is aware of what to do with acute symptoms. Verified medication adherence and pt attending all medical appointments with sufficient transportation. 3/29- Primary caregiver reports pt had a recent visit with her provider and Dr. Judithann Sheen was very please with pt and all labs were good. Only added Voltaren gel for neuropathy pain PRN with no other changes in her medications.Pt continue to weight daily and remain around her baseline weight 140 lbs with no related symptoms or fluid retention. 2/21- POA daughter Ashley Hill indicates pt is doing "great". States pt is ambulating around the block with neighbors and continue to track her weights and aware of who to notify with any symptoms. Utilizing to resources send by this RN case management.         Ashley Cousin, RN Care Management Coordinator [facility] Main Office [phone]

## 2021-02-08 ENCOUNTER — Ambulatory Visit: Payer: Self-pay | Admitting: *Deleted

## 2021-03-04 ENCOUNTER — Other Ambulatory Visit: Payer: Self-pay | Admitting: *Deleted

## 2021-03-04 NOTE — Patient Outreach (Signed)
[facility] Care Management  03/04/2021  AMERIA SANJURJO 1934/06/19 [id number]   Telephone Assessment-Unsuccessful  RN attempted outreach call today however unsuccessful. RN able to leave a HIPAA approved voice message requesting a call back.  Will attempted another outreach over the next week for services.  [name], RN Care Management Coordinator [facility] Main Office [phone]

## 2021-03-10 ENCOUNTER — Other Ambulatory Visit: Payer: Self-pay | Admitting: *Deleted

## 2021-03-10 NOTE — Patient Outreach (Signed)
[facility]) Care Management  03/10/2021  Ashley Hill 01-08-1935 [id number]   Telephone Assessment-Successful-HF  RN spoke with daughter [name] who provided an update on pt's ongoing management of care. Plan of care discussed and updated accordingly with progress and issues. Daughter continue to see progress with pt and educated well enough to recognize related symptoms and when to contact pt's provided accordingly.  Will send update to pt's provider on pt's disposition with [facility] and follow up  next month as requested.   Goals Addressed             This Visit's Progress    THN-Track and Manage Fluids and Swelling-Heart Failure   On track    Follow Up Date 04/12/2021  Timeframe:  Long-Range Goal Priority:  Medium Start Date:   05/24/2020                          Expected End Date:  04/13/2021                     - call office if I gain more than 2 pounds in one day or 5 pounds in one week - do ankle pumps when sitting - keep legs up while sitting - track weight in diary - use salt in moderation - watch for swelling in feet, ankles and legs every day - weigh myself daily   Barriers: Health Behaviors  Why is this important?   It is important to check your weight daily and watch how much salt and liquids you have.  It will help you to manage your heart failure.    Notes:  10/25- Reports daily weights of 130-132 with no encountered symptoms. Pt ambulatory with her outings enjoying time well time with her family. Pt remains in the GREEN zone with her HF symptoms. 7/28-Verified pt doing well and remains in the GREEN zone with no acute symptoms reported by pt's daughter. Will continue to encouraged adherence to lower the risk for HF exacerbation.  4/26-Pt managing her HF with reported her documented weights at baseline of 136 lbs with no fluid retention since the last conversation with the pt's caregiver daughter [name]. Pt remains in the GREEN zone with no reported  symptoms.  1/10-Educated on discussed interventions noted above to reduce the risk of hospitalization and exacerbation of symptoms. Will send tool (Calender) and encouraged daily documentation of weights for providers to view on monitoring pt's ongoing HF.      THN-Track and Manage Symptoms-Heart Failure   On track    Follow Up Date 04/12/2021 Timeframe:  Short-Term Goal Priority:  Medium Start Date:   05/24/2020                          Expected End Date:    04/13/2021                   - begin a heart failure diary - develop a rescue plan - follow rescue plan if symptoms flare-up - know when to call the doctor - track symptoms and what helps feel better or worse   Barriers: Health Behaviors  Why is this important?   You will be able to handle your symptoms better if you keep track of them.  Making some simple changes to your lifestyle will help.  Eating healthy is one thing you can do to take good care of yourself.  Notes:  10/25-Daughter states pt remains in the GREEN zone with no acute symptoms. Consistent with her weighs at 130-132 with no fluid retention or swelling. [name] very aware of symptoms and what to do with acute encounters.  9/22-Caregiver reports pt's weights are within her normal baseline of  130-132 lbs with no acute or related symptoms. Taking all medications as prescribed and doing well with no residual symptoms of HF.  8/26- Reports no acute symptoms related to her HF. Pt's daughter [name] indicates pt is doing well and remains in the GREEN zone.  Pt is maintaining her weight with no swelling or related symptoms.                    7/28-Daughter reports pt without symptoms and continues to do well with no acute issues. Continues to have transportation resources and supportive care from friends and family. 6/7- Spoke with the primary caregiver daughter [name] who indicated pt had a ED visit in April for breathing issues and this was very distressful for the pt.  Therefore pt's CAD provided a plan if this occurs again. If  pt starts to have respiratory distress to provider requested pt to take  "an extra fluid pill and O2". States pt had another episode a few weeks ago and instead of going to the hospital where pt gets very stressed the daughter provided the recommended care and pt recovered in a few hours. Will continue to encouraged adherence to the recommended plan for urent needs however daughter aware of when to seek emergency care for this pt if needed. 4/26- Pt states she id doing well with no symptoms of her HF and denies any swelling. Clarified for pt the GREEN zone and verified pt remains in the GREEN zone. Pt reports she is aware of what to do with acute symptoms. Verified medication adherence and pt attending all medical appointments with sufficient transportation. 3/29- Primary caregiver reports pt had a recent visit with her provider and Dr. Judithann Sheen was very please with pt and all labs were good. Only added Voltaren gel for neuropathy pain PRN with no other changes in her medications.Pt continue to weight daily and remain around her baseline weight 140 lbs with no related symptoms or fluid retention. 2/21- POA daughter [name] indicates pt is doing "great". States pt is ambulating around the block with neighbors and continue to track her weights and aware of who to notify with any symptoms. Utilizing to resources send by this RN case management.         [name], RN Care Management Coordinator [facility] Main Office [phone]

## 2021-04-12 ENCOUNTER — Other Ambulatory Visit: Payer: Self-pay | Admitting: *Deleted

## 2021-04-12 NOTE — Patient Outreach (Signed)
[facility]) Care Management  04/12/2021  Ashley Hill 1934-09-09 [id number]  Telephone Assessment-Successful  RN spoke with the pt's daughter Ashley Hill with all updates. Reviewed and discussed the current plan of care and pt's progress. Daughter states pt is doing extremely well with no acute issues presented. States pt continue to stay around her baseline weight at 130 lbs with related symptoms concerning her HF. No new issues to address at this time.  Will follow up once again next month on pt's ongoing progress related to her self management of care.   Goals Addressed             This Visit's Progress    THN-Track and Manage Fluids and Swelling-Heart Failure   On track    Follow Up Date 05/12/2021  Timeframe:  Long-Range Goal Priority:  Medium Start Date:   05/24/2020                          Expected End Date:  06/14/2021                    - call office if I gain more than 2 pounds in one day or 5 pounds in one week - do ankle pumps when sitting - keep legs up while sitting - track weight in diary - use salt in moderation - watch for swelling in feet, ankles and legs every day - weigh myself daily   Barriers: Health Behaviors  Why is this important?   It is important to check your weight daily and watch how much salt and liquids you have.  It will help you to manage your heart failure.    Notes:  10/25- Reports daily weights of 130-132 with no encountered symptoms. Pt ambulatory with her outings enjoying time well time with her family. Pt remains in the GREEN zone with her HF symptoms. 7/28-Verified pt doing well and remains in the GREEN zone with no acute symptoms reported by pt's daughter. Will continue to encouraged adherence to lower the risk for HF exacerbation.  4/26-Pt managing her HF with reported her documented weights at baseline of 136 lbs with no fluid retention since the last conversation with the pt's caregiver daughter Ashley Hill. Pt remains in the  GREEN zone with no reported symptoms.  1/10-Educated on discussed interventions noted above to reduce the risk of hospitalization and exacerbation of symptoms. Will send tool (Calender) and encouraged daily documentation of weights for providers to view on monitoring pt's ongoing HF.      THN-Track and Manage Symptoms-Heart Failure   On track    Follow Up Date 05/12/2021 Timeframe:  Short-Term Goal Priority:  Medium Start Date:   05/24/2020                          Expected End Date:    05/13/2021                   - begin a heart failure diary - develop a rescue plan - follow rescue plan if symptoms flare-up - know when to call the doctor - track symptoms and what helps feel better or worse   Barriers: Health Behaviors  Why is this important?   You will be able to handle your symptoms better if you keep track of them.  Making some simple changes to your lifestyle will help.  Eating healthy is one thing you can do  to take good care of yourself.    Notes:  11/29- Caregiver daughter Ashley Hill reports pt continues to do well with no acute issues or events. Pt remains in the GREEN zone weighing at baseline of 130 lbs with no related symptoms. 10/25-Daughter states pt remains in the GREEN zone with no acute symptoms. Consistent with her weighs at 130-132 with no fluid retention or swelling. Ashley Hill very aware of symptoms and what to do with acute encounters.  9/22-Caregiver reports pt's weights are within her normal baseline of  130-132 lbs with no acute or related symptoms. Taking all medications as prescribed and doing well with no residual symptoms of HF.  8/26- Reports no acute symptoms related to her HF. Pt's daughter Ashley Hill indicates pt is doing well and remains in the GREEN zone.  Pt is maintaining her weight with no swelling or related symptoms.                    7/28-Daughter reports pt without symptoms and continues to do well with no acute issues. Continues to have transportation resources  and supportive care from friends and family. 6/7- Spoke with the primary caregiver daughter Ashley Hill who indicated pt had a ED visit in April for breathing issues and this was very distressful for the pt. Therefore pt's CAD provided a plan if this occurs again. If  pt starts to have respiratory distress to provider requested pt to take  "an extra fluid pill and O2". States pt had another episode a few weeks ago and instead of going to the hospital where pt gets very stressed the daughter provided the recommended care and pt recovered in a few hours. Will continue to encouraged adherence to the recommended plan for urent needs however daughter aware of when to seek emergency care for this pt if needed. 4/26- Pt states she id doing well with no symptoms of her HF and denies any swelling. Clarified for pt the GREEN zone and verified pt remains in the GREEN zone. Pt reports she is aware of what to do with acute symptoms. Verified medication adherence and pt attending all medical appointments with sufficient transportation. 3/29- Primary caregiver reports pt had a recent visit with her provider and Dr. Judithann Sheen was very please with pt and all labs were good. Only added Voltaren gel for neuropathy pain PRN with no other changes in her medications.Pt continue to weight daily and remain around her baseline weight 140 lbs with no related symptoms or fluid retention. 2/21- POA daughter Ashley Hill indicates pt is doing "great". States pt is ambulating around the block with neighbors and continue to track her weights and aware of who to notify with any symptoms. Utilizing to resources send by this RN case management.         [name], RN Care Management Coordinator [facility] Main Office [phone]

## 2021-05-12 ENCOUNTER — Other Ambulatory Visit: Payer: Self-pay | Admitting: *Deleted

## 2021-05-12 NOTE — Patient Outreach (Signed)
[facility]) Care Management  05/12/2021  TAWSHA TERRERO Feb 04, 1935 [id number]   Telephone Assessment-Unsuccessful   RN attempted outreach call today however unsuccessful. RN able to leave a HIPAA approved voice message requesting a call back.  Will attempt another outreach over the next week for ongoing case management services.  [name], RN Care Management Coordinator [facility] Main Office [phone]

## 2021-05-18 ENCOUNTER — Other Ambulatory Visit: Payer: Self-pay | Admitting: *Deleted

## 2021-05-18 NOTE — Patient Outreach (Addendum)
[facility]) Care Management  05/18/2021  Ashley Hill [birth year] [id number]   Telephone Assessment-Unsuccessful  RN attempted outreach call today however unsuccessful. RN able to leave a HIPAA approved voice message to pt's daughter cell [name]) requesting a call back.  RN will continue to follow up  for ongoing [facility] services and attempt another outreach call over the next week.  [name], RN Care Management Coordinator [facility] Main Office [phone]

## 2021-05-24 ENCOUNTER — Other Ambulatory Visit: Payer: Self-pay | Admitting: *Deleted

## 2021-05-24 NOTE — Patient Outreach (Signed)
[facility]) Care Management  05/24/2021  Ashley Hill Dec 09, 1934 [id number]   Telephone Assessment-Successful  RN spoke with pt's daughter Ashley Hill who indicates pt is doing well with no acute needs at this time.  Family seeking memory placement for pt wit a pending evaluation with [facility] on Friday for pending placement. Pt continue to follow the current plan of care however will follow up next month concerning pt's progress with pending placement.  Will follow up next month on pt's disposition with placement and communicate with the provider on pt's disposition with [facility] services.   Goals Addressed             This Visit's Progress    THN-Track and Manage Fluids and Swelling-Heart Failure   On track    Follow Up Date 06/27/2021 Timeframe:  Long-Range Goal Priority:  Medium Start Date:   05/24/2020                          Expected End Date:  09/09/2021                    - call office if I gain more than 2 pounds in one day or 5 pounds in one week - do ankle pumps when sitting - keep legs up while sitting - track weight in diary - use salt in moderation - watch for swelling in feet, ankles and legs every day - weigh myself daily   Barriers: Health Behaviors  Why is this important?   It is important to check your weight daily and watch how much salt and liquids you have.  It will help you to manage your heart failure.    Notes:  1/10-Daughter reports pt continue to be adherent to the discussed plan of care. States pt's HF is being managed well with no acute events or related symptoms. States pt is exercises more with daily walks and pt does not have any swelling as a result. Will continue to encourage adherence to avoid acute issues from occurring. 10/25- Reports daily weights of 130-132 with no encountered symptoms. Pt ambulatory with her outings enjoying time well time with her family. Pt remains in the GREEN zone with her HF symptoms. 7/28-Verified pt doing  well and remains in the GREEN zone with no acute symptoms reported by pt's daughter. Will continue to encouraged adherence to lower the risk for HF exacerbation.  4/26-Pt managing her HF with reported her documented weights at baseline of 136 lbs with no fluid retention since the last conversation with the pt's caregiver daughter Ashley Hill. Pt remains in the GREEN zone with no reported symptoms.  1/10-Educated on discussed interventions noted above to reduce the risk of hospitalization and exacerbation of symptoms. Will send tool (Calender) and encouraged daily documentation of weights for providers to view on monitoring pt's ongoing HF.      THN-Track and Manage Symptoms-Heart Failure   On track    Follow Up Date 06/27/2021 Timeframe:  Short-Term Goal Priority:  Medium Start Date:   05/24/2020                          Expected End Date:    07/12/2021           - begin a heart failure diary - develop a rescue plan - follow rescue plan if symptoms flare-up - know when to call the doctor - track symptoms and what helps  feel better or worse   Barriers: Health Behaviors  Why is this important?   You will be able to handle your symptoms better if you keep track of them.  Making some simple changes to your lifestyle will help.  Eating healthy is one thing you can do to take good care of yourself.    Notes:  1/10-Daughter Ashley Hill indicates pt is doing well with no acute needs. Pt remains in the GREEN zone with no acute issues or needs. Weight reported at 132 lbs with no swelling or related issues.  11/29- Caregiver daughter Ashley Hill reports pt continues to do well with no acute issues or events. Pt remains in the GREEN zone weighing at baseline of 130 lbs with no related symptoms. 10/25-Daughter states pt remains in the GREEN zone with no acute symptoms. Consistent with her weighs at 130-132 with no fluid retention or swelling. Ashley Hill very aware of symptoms and what to do with acute encounters.  9/22-Caregiver  reports pt's weights are within her normal baseline of  130-132 lbs with no acute or related symptoms. Taking all medications as prescribed and doing well with no residual symptoms of HF.  8/26- Reports no acute symptoms related to her HF. Pt's daughter Ashley Hill indicates pt is doing well and remains in the GREEN zone.  Pt is maintaining her weight with no swelling or related symptoms.                    7/28-Daughter reports pt without symptoms and continues to do well with no acute issues. Continues to have transportation resources and supportive care from friends and family. 6/7- Spoke with the primary caregiver daughter Ashley Hill who indicated pt had a ED visit in April for breathing issues and this was very distressful for the pt. Therefore pt's CAD provided a plan if this occurs again. If  pt starts to have respiratory distress to provider requested pt to take  "an extra fluid pill and O2". States pt had another episode a few weeks ago and instead of going to the hospital where pt gets very stressed the daughter provided the recommended care and pt recovered in a few hours. Will continue to encouraged adherence to the recommended plan for urent needs however daughter aware of when to seek emergency care for this pt if needed. 4/26- Pt states she id doing well with no symptoms of her HF and denies any swelling. Clarified for pt the GREEN zone and verified pt remains in the GREEN zone. Pt reports she is aware of what to do with acute symptoms. Verified medication adherence and pt attending all medical appointments with sufficient transportation. 3/29- Primary caregiver reports pt had a recent visit with her provider and Dr. Doy Hutching was very please with pt and all labs were good. Only added Voltaren gel for neuropathy pain PRN with no other changes in her medications.Pt continue to weight daily and remain around her baseline weight 140 lbs with no related symptoms or fluid retention. 2/21- POA daughter Ashley Hill  indicates pt is doing "great". States pt is ambulating around the block with neighbors and continue to track her weights and aware of who to notify with any symptoms. Utilizing to resources send by this RN case management.         Raina Mina, RN Care Management Coordinator [place] Office [phone]

## 2021-06-27 ENCOUNTER — Other Ambulatory Visit: Payer: Self-pay | Admitting: *Deleted

## 2021-06-27 NOTE — Patient Instructions (Signed)
Visit Information  Thank you for taking time to visit with me today. Please don't hesitate to contact me if I can be of assistance to you before our next scheduled telephone appointment.  Following are the goals we discussed today:  Take all medications as prescribed Attend all scheduled provider appointments Call pharmacy for medication refills 3-7 days in advance of running out of medications Attend church or other social activities Perform all self care activities independently  Perform IADL's (shopping, preparing meals, housekeeping, managing finances) independently Call provider office for new concerns or questions  call office if I gain more than 2 pounds in one day or 5 pounds in one week do ankle pumps when sitting keep legs up while sitting meet with dietitian track weight in diary use salt in moderation watch for swelling in feet, ankles and legs every day weigh myself daily begin a heart failure diary bring diary to all appointments develop a rescue plan follow rescue plan if symptoms flare-up eat more whole grains, fruits and vegetables, lean meats and healthy fats know when to call the doctor:based upon the provided education related to the RED or related symptoms that become acute  track symptoms and what helps feel better or worse dress right for the weather, hot or cold

## 2021-06-27 NOTE — Patient Outreach (Signed)
[facility]) Care Management Telephonic RN Care Manager Note   06/27/2021 Name:  Ashley Hill MRN:  [id number] DOB:  03-22-1935   Subjective: Ashley Hill is an 87 y.o. year old female who is a primary patient of Sparks, Leonie Douglas, MD. The care management team was consulted for assistance with care management and/or care coordination needs.    Telephonic RN Care Manager completed Telephone Visit today.  Objective:   Medications Reviewed Today     Reviewed by Arby Barrette, CPhT (Pharmacy Technician) on 12/16/20 at 1726  Med List Status: Complete   Medication Order Taking? Sig Documenting Provider Last Dose Status Informant  acetaminophen (TYLENOL) 500 MG tablet [id number] No Take 500 mg by mouth every 6 (six) hours as needed for moderate pain or headache. [provider] unknown prn Active Child  carvedilol (COREG) 25 MG tablet [id number] Yes Take 25 mg by mouth 2 (two) times daily with a meal. [provider] 12/16/2020 0900 Active Child  cloNIDine (CATAPRES) 0.1 MG tablet [id number] Yes Take 0.1 mg by mouth 2 (two) times daily. [provider] 12/16/2020 0900 Active Child  Cyanocobalamin (B-12 COMPLIANCE INJECTION) 1000 MCG/ML KIT [id number]  Inject 1,000 mcg as directed every 30 (thirty) days. [provider]  Active Child  diltiazem (CARDIZEM CD) 240 MG 24 hr capsule [id number] Yes Take 240 mg by mouth daily. [provider] 12/16/2020 0900 Active Child  donepezil (ARICEPT) 10 MG tablet [id number] Yes Take 10 mg by mouth at bedtime. [provider] 12/16/2020 0900 Active Child           Med Note Derrek Gu   Fri Apr 30, 2020  3:51 PM) Patient's daughter doesn't think patient is still taking.  ELIQUIS 5 MG TABS tablet [id number] Yes Take 1 tablet by mouth twice a day  Patient taking differently: Take 5 mg by mouth 2 (two) times daily.   Kris Hartmann, NP 12/16/2020 0900 Active Child           Med Note Arby Barrette   Fri Apr 30, 2020  3:17 PM)    furosemide (LASIX) 40 MG tablet [id number] Yes Take 1 tablet (40 mg total) by mouth daily. Wyvonnia Dusky, MD 12/16/2020 0900 Active Child  potassium chloride (KLOR-CON) 10 MEQ tablet [id number] Yes Take 2 tablets (20 mEq total) by mouth daily. Wyvonnia Dusky, MD 12/16/2020 0900 Active Child           Med Note Arby Barrette   Thu Dec 16, 2020  5:26 PM) Patient takes 10 mEq twice daily  rosuvastatin (CRESTOR) 10 MG tablet [id number] Yes Take 10 mg by mouth daily. [provider] 12/16/2020 0900 Active Child             SDOH:  (Social Determinants of Health) assessments and interventions performed:     Care Plan  Review of patient past medical history, allergies, medications, health status, including review of consultants reports, laboratory and other test data, was performed as part of comprehensive evaluation for care management services.   Care Plan : Heart Failure (Adult)  Updates made by Tobi Bastos, RN since 06/27/2021 12:00 AM     Problem: Symptom Exacerbation (Heart Failure)   Priority: Medium     Goal: Symptom Exacerbation Prevented or Minimized Completed 06/27/2021  Start Date: 05/24/2020  Expected End Date: 07/12/2021  Recent Progress: On track  Priority: Medium  Note:   Evidence-based guidance:  Perform or review cognitive  and/or health literacy screening.  Assess understanding of adherence and barriers to treatment plan, as well as lifestyle changes; develop strategies to address barriers.  Establish a mutually-agreed-upon early intervention process to communicate with primary care provider when signs/symptoms worsen.  Facilitate timely posthospital discharge or emergency department treatment that includes intensive follow-up via telephone calls, home visit, telehealth monitoring and care at multidisciplinary heart failure clinic.  Adjust frequency and intensity of follow-up based on presentation, number  of emergency department visits, hospital admissions and frequency and severity of symptom exacerbation.  Facilitate timely visit, usually within 1 week, with primary care provider following hospital discharge.  Collaborate with clinical pharmacist to address adverse drug reactions, drug interactions, subtherapeutic dosage, patient and family education.  Regularly screen for presence of depressive symptoms using a validated tool; consider pharmacologic therapy and/or referral for cognitive behavioral therapy when present.  Refer to community-based services, such as a heart failure support group, community Economist or peer support program.  Review immunization status; arrange receipt of needed vaccinations.  Prepare patient for home oxygen use based on signs/symptoms.   Notes:  01/27/7828-FAOZHYQMV due to duplicate goal     Task: Identify and Minimize Risk of Heart Failure Exacerbation Completed 06/27/2021  Due Date: 07/12/2021  Priority: Routine  Note:   Care Management Activities:    - barriers to lifestyle changes reviewed and addressed - cognitive screening completed and reviewed - depression screen reviewed - healthy lifestyle promoted - medication-adherence assessment completed - rescue (action) plan developed - rescue (action) plan reviewed - self-awareness of signs/symptoms of worsening disease encouraged  Notes:  1/10-Daughter states pt continues to do well with no acute issues. Pt remains in the GREEN zone with weights around 132 lbs. Will continue to encouraged adherence with ongoing monitoring.  11/29-Daughter continue to verify pt remains in the GREEN zone with no acute symptoms of related issues. Remains at her baseline weights at 130 lbs with no complications.  10/25- Verified from caregiver daughter Ashley Hill reports pt remains in the GREEN zone with no acute symptoms. Consistent with her weighs at 130-132 with no fluid retention or swelling. Ashley Hill very aware of symptoms and  what to do with acute encounters.  9/22- Pt remains in the GREEN zone with no acute symptoms and continue to manager her HF with assistance from her daughters. Pt continue to recover well her recent hospitalization last month. 8/26-Pt continue to do well with no acute symptoms. Pt's daughter Ashley Hill reports pt is doing well and remains in the GREEN zone maintaining her weight with no swelling or related symptoms.  7/28 Remains without symptoms and in the GREEN zone reports daughter. Will continue to encourage adherence.  6/7-Pt currently in the GREEN zone with no acute symptoms today however a few weeks ago express pt had an episode with her breathing but has received permission to take an extra fluid medication and use Home O2 if necessary. Will encourage adherence in managing her ongoing HF. Weights reported at 139 lbs below baseline with no additional symptoms.  4/26-Pt continue to do well with her daily weights reported at 136 lbs with no fluid retention or related issues of symptoms. Will continue to encourage ongoing adherence in managing this condition. Pt aware when to seek medical attention with any precipitating symptoms. 3/29-Pt doing well with a good report from her PCP as pt remains asymptomatic with no swelling or related symptoms. Reports weights around the 140's with no 3 lbs overnight or 5 lbs within the one week. 2/21-All  discussed above related to interventions and goals. POA remains receptive to ongoing education with the pt on these topics discussed today. Rescue action review and discussed related to symptoms management. Encouraged adherence to all discussed.    Problem: Disease Progression (Heart Failure)   Priority: Medium     Long-Range Goal: Comorbidities Identified and Managed Completed 06/27/2021  Start Date: 05/24/2020  Expected End Date: 09/09/2021  Recent Progress: On track  Priority: Medium  Note:   Evidence-based guidance:  Assess and address signs/symptoms of  comorbidity, including dyslipidemia, diabetes, iron deficiency, gout, arthritis, dysrhythmia, hypertension, cachexia, coronary artery disease, kidney dysfunction and lung disease.  Prepare patient for laboratory and diagnostic exams based on risk and presentation.  Prepare for use of pharmacologic therapy that may include statin, angiotensin converting enzyme (ACE) inhibitor, angiotensin receptor blocker (ARB), beta-blocker, digoxin, antidysrhythmic, diuretic or omega-3 fatty acid.  Monitor side effects and anticipate need for periodic adjustments.  Prepare patient for potential invasive treatment, such as implantable cardioverter-defibrillator, cardiac resynchronization therapy or heart transplant as disease progresses.   Notes:   0/27/7412-INOMVEHMC due to duplicate goal     Task: Identify and Manage Comorbidities Completed 06/27/2021  Due Date: 09/09/2021  Priority: Routine  Note:   Care Management Activities:    - medication side effects monitored and managed - signs/symptoms of comorbidities identified    Notes:  1/10- Daughter provided update today indicating pt continue to do well with no issues related to her other comorbidities. Pt continue to have a good support system. Daughter states the family is seeking information concerning memory care units for pt's level of care. States Ashley Hill will evaluate pt this Friday for placement. 10/25- Verified pt taking all her medications as prescribed. Reports some dizziness however easy resolved as pt was not consuming enough with her meal plans. No additional issues with her eating habits or related symptoms. 7/28 Pt continue to improve based upon the current plan of care. Will continue to offer needed resources for her ongoing management of care. 4/26-Will continue to extend this goal and encouraged ongoing adherence with her daily monitoring and manage her symptoms accordingly. Will verify pt continues to take all her prescribed medications  with no acute delays. 2/21-Verified pt continues to take all prescribed medications with sufficient refills/supplies. No signs/symptoms related to any comorbidities encountered over the last month.    Care Plan : RN Care Manger Plan of Care  Updates made by Tobi Bastos, RN since 06/27/2021 12:00 AM     Problem: Knowledge Deficit related to CHF management and care coordination needs   Priority: High     Long-Range Goal: Establish plan of care for managing complex care coordination needs in patient with CHF   Start Date: 06/27/2021  Expected End Date: 01/12/2022  Priority: High  Note:   Current Barriers:  Knowledge Deficits related to plan of care for management of CHF   RNCM Clinical Goal(s):  Patient will verbalize understanding of plan for management of CHF as evidenced by reports received from the primary caregiver daughter Ashley Hill  through collaboration with Consulting civil engineer, provider, and care team.   Interventions: Inter-disciplinary care team collaboration (see longitudinal plan of care) Evaluation of current treatment plan related to  self management and patient's adherence to plan as established by provider   Heart Failure Interventions:  (Status:  New goal. and Goal on track:  Yes.) Long Term Goal Basic overview and discussion of pathophysiology of Heart Failure reviewed Provided education on low sodium diet Reviewed  Heart Failure Action Plan in depth and provided written copy Assessed need for readable accurate scales in home Provided education about placing scale on hard, flat surface Advised patient to weigh each morning after emptying bladder Discussed importance of daily weight and advised patient to weigh and record daily Reviewed role of diuretics in prevention of fluid overload and management of heart failure; Discussed the importance of keeping all appointments with provider Screening for signs and symptoms of depression related to chronic disease state   Assessed social determinant of health barriers   Daughter reports she has obtain 2 hired two caregiver along with rotation with family members to address pt's ongoing care throughout the day. Pt adjust fairly well with no missed medication and continue to attend all medical appointments with no delays. Daughter has also requested from the provider a social worker  Engineer, maintenance (IT)) who has visited the home with suggestive resources [place] who introduced Respite care. Daughter has pursued further and obtained 15 hrs if needed along with the 11 hours covered by private hired throughout the week again along with family. Reports pt continues to maintain her baseline weight with no acute or precipitating symptoms of HF since the last conversation last month.  Other topics discussed along with reiteration on the current plan of care related to H. Pt continue to be adherent to all discussed with family assistance. Patient Goals/Self-Care Activities: Take all medications as prescribed Attend all scheduled provider appointments Call pharmacy for medication refills 3-7 days in advance of running out of medications Attend church or other social activities Perform all self care activities independently  Perform IADL's (shopping, preparing meals, housekeeping, managing finances) independently Call provider office for new concerns or questions  call office if I gain more than 2 pounds in one day or 5 pounds in one week do ankle pumps when sitting keep legs up while sitting meet with dietitian track weight in diary use salt in moderation watch for swelling in feet, ankles and legs every day weigh myself daily begin a heart failure diary bring diary to all appointments develop a rescue plan follow rescue plan if symptoms flare-up eat more whole grains, fruits and vegetables, lean meats and healthy fats know when to call the doctor:based upon the provided education related to the RED or related symptoms  that become acute  track symptoms and what helps feel better or worse dress right for the weather, hot or cold  Follow Up Plan:  Telephone follow up appointment with care management team member scheduled for:  March 2023        Raina Mina, RN Care Management Coordinator [place] Office [phone]

## 2021-07-25 ENCOUNTER — Other Ambulatory Visit: Payer: Self-pay | Admitting: *Deleted

## 2021-07-25 NOTE — Patient Outreach (Signed)
[facility]) Care Management Telephonic RN Care Manager Note   07/25/2021 Name:  Ashley Hill MRN:  [id number] DOB:  1935/02/25  Summary: Pt encountered incident with lower legs swelling for a few days. Daughter [name]) reports this quickly resolved with no additional issues. Pt did not need additional medication however daughter indicated pt's provider has given her permission to give additional Lasix if needed with such encounters. Pt is in the GREEN zone and remains a asymptomatic.  Recommendations/Changes made from today's visit: Will continue to encourage daily weights and educate the new caregivers in the home on what to do if acute symptoms should occur.   Subjective: Ashley Hill is an 87 y.o. year old female who is a primary patient of Sparks, Duane Lope, MD. The care management team was consulted for assistance with care management and/or care coordination needs.    Telephonic RN Care Manager completed Telephone Visit today.  Objective:   Medications Reviewed Today     Reviewed by Sharia Reeve, CPhT (Pharmacy Technician) on 12/16/20 at 1726  Med List Status: Complete   Medication Order Taking? Sig Documenting Provider Last Dose Status Informant  acetaminophen (TYLENOL) 500 MG tablet [id number] No Take 500 mg by mouth every 6 (six) hours as needed for moderate pain or headache. [provider] unknown prn Active Child  carvedilol (COREG) 25 MG tablet [id number] Yes Take 25 mg by mouth 2 (two) times daily with a meal. [provider] 12/16/2020 0900 Active Child  cloNIDine (CATAPRES) 0.1 MG tablet [id number] Yes Take 0.1 mg by mouth 2 (two) times daily. [provider] 12/16/2020 0900 Active Child  Cyanocobalamin (B-12 COMPLIANCE INJECTION) 1000 MCG/ML KIT [id number]  Inject 1,000 mcg as directed every 30 (thirty) days. [provider]  Active Child  diltiazem (CARDIZEM CD) 240 MG 24 hr capsule [id number] Yes Take 240 mg by mouth  daily. [provider] 12/16/2020 0900 Active Child  donepezil (ARICEPT) 10 MG tablet [id number] Yes Take 10 mg by mouth at bedtime. [provider] 12/16/2020 0900 Active Child           Med Note Marchelle Folks   Fri Apr 30, 2020  3:51 PM) Patient's daughter doesn't think patient is still taking.  ELIQUIS 5 MG TABS tablet [id number] Yes Take 1 tablet by mouth twice a day  Patient taking differently: Take 5 mg by mouth 2 (two) times daily.   Georgiana Spinner, NP 12/16/2020 0900 Active Child           Med Note Sharia Reeve   Fri Apr 30, 2020  3:17 PM)    furosemide (LASIX) 40 MG tablet [id number] Yes Take 1 tablet (40 mg total) by mouth daily. Charise Killian, MD 12/16/2020 0900 Active Child  potassium chloride (KLOR-CON) 10 MEQ tablet [id number] Yes Take 2 tablets (20 mEq total) by mouth daily. Charise Killian, MD 12/16/2020 0900 Active Child           Med Note Sharia Reeve   Thu Dec 16, 2020  5:26 PM) Patient takes 10 mEq twice daily  rosuvastatin (CRESTOR) 10 MG tablet [id number] Yes Take 10 mg by mouth daily. [provider] 12/16/2020 0900 Active Child             SDOH:  (Social Determinants of Health) assessments and interventions performed:     Care Plan  Review of patient past medical history, allergies, medications, health status, including review of consultants reports, laboratory and other  test data, was performed as part of comprehensive evaluation for care management services.   Care Plan : RN Care Manger Plan of Care  Updates made by Alejandro Mulling, RN since 07/25/2021 12:00 AM     Problem: Knowledge Deficit related to CHF management and care coordination needs   Priority: High     Long-Range Goal: Establish plan of care for managing complex care coordination needs in patient with CHF   Start Date: 06/27/2021  Expected End Date: 01/12/2022  This Visit's Progress: On track  Priority: High  Note:   Current Barriers:   Knowledge Deficits related to plan of care for management of CHF   RNCM Clinical Goal(s):  Patient will verbalize understanding of plan for management of CHF as evidenced by reports received from the primary caregiver daughter [name]  through collaboration with Medical illustrator, provider, and care team.   Interventions: Inter-disciplinary care team collaboration (see longitudinal plan of care) Evaluation of current treatment plan related to  self management and patient's adherence to plan as established by provider   Heart Failure Interventions:  (Status:  New goal. and Goal on track:  Yes.) Long Term Goal Basic overview and discussion of pathophysiology of Heart Failure reviewed Provided education on low sodium diet Reviewed Heart Failure Action Plan in depth and provided written copy Assessed need for readable accurate scales in home Provided education about placing scale on hard, flat surface Advised patient to weigh each morning after emptying bladder Discussed importance of daily weight and advised patient to weigh and record daily Reviewed role of diuretics in prevention of fluid overload and management of heart failure; Discussed the importance of keeping all appointments with provider Screening for signs and symptoms of depression related to chronic disease state  Assessed social determinant of health barriers   06/27/2021 Update: Daughter reports she has obtain 2 hired two caregiver along with rotation with family members to address pt's ongoing care throughout the day. Pt adjust fairly well with no missed medication and continue to attend all medical appointments with no delays. Daughter has also requested from the provider a social worker  Product/process development scientist) who has visited the home with suggestive resources [facility] who introduced Respite care. Daughter has pursued further and obtained 15 hrs if needed along with the 11 hours covered by private hired throughout the week again along  with family. Reports pt continues to maintain her baseline weight with no acute or precipitating symptoms of HF since the last conversation last month.  Other topics discussed along with reiteration on the current plan of care related to H. Pt continue to be adherent to all discussed with family assistance.  07/25/2021 Update: Spoke with daughter [name]) who reports pt having some swelling 2 weeks ago and she intervened with closer monitoring, elevating her legs and pt dropped to her baseline weight in a few days with no additional issues. Caregiver has permission to administer extra medication (Lasix) but the issue resolved and pt did not require additional medication. Pt is otherwise doing well with private hired caregivers in the home daily. Pt currently in the GREEN zone and continue to follow the plan of care discussed today with no other needs. Will continue to encouraged ongoing management of care related to HF with elevating her legs and early interventions to prevent hospitalization or ED visits. Addressed all inquires and requested to follow up next month for ongoing management of care.  Patient Goals/Self-Care Activities: Take all medications as prescribed Attend all scheduled  provider appointments Call pharmacy for medication refills 3-7 days in advance of running out of medications Attend church or other social activities Perform all self care activities independently  Perform IADL's (shopping, preparing meals, housekeeping, managing finances) independently Call provider office for new concerns or questions  call office if I gain more than 2 pounds in one day or 5 pounds in one week do ankle pumps when sitting keep legs up while sitting meet with dietitian track weight in diary use salt in moderation watch for swelling in feet, ankles and legs every day weigh myself daily begin a heart failure diary bring diary to all appointments develop a rescue plan follow rescue plan if symptoms  flare-up eat more whole grains, fruits and vegetables, lean meats and healthy fats know when to call the doctor:based upon the provided education related to the RED or related symptoms that become acute  track symptoms and what helps feel better or worse dress right for the weather, hot or cold  Follow Up Plan:  Telephone follow up appointment with care management team member scheduled for:  April 2023        [name], RN Care Management Coordinator [facility] Main Office [phone]

## 2021-08-23 ENCOUNTER — Ambulatory Visit (INDEPENDENT_AMBULATORY_CARE_PROVIDER_SITE_OTHER): Payer: Medicare Other

## 2021-08-23 ENCOUNTER — Ambulatory Visit (INDEPENDENT_AMBULATORY_CARE_PROVIDER_SITE_OTHER): Payer: Medicare Other | Admitting: Vascular Surgery

## 2021-08-23 DIAGNOSIS — I6523 Occlusion and stenosis of bilateral carotid arteries: Secondary | ICD-10-CM

## 2021-08-23 DIAGNOSIS — I70211 Atherosclerosis of native arteries of extremities with intermittent claudication, right leg: Secondary | ICD-10-CM

## 2021-08-25 ENCOUNTER — Other Ambulatory Visit: Payer: Self-pay | Admitting: *Deleted

## 2021-08-25 NOTE — Patient Outreach (Signed)
?[facility]) Care Management ?Telephonic RN Care Manager Note ? ? ?08/25/2021 ?Name:  Ashley Hill MRN:  [id number] DOB:  06/19/1934 ? ?Summary: ?Pt doing well with hired help in the home providing daily care for the patient. No acute issues or needs at this time. Pt remains at her baseline weights with no HF symptoms or fluid retention. ? ?Recommendations/Changes made from today's visit: ?Will continue to reiterated on HF symptoms and the current plan of care. POA and caregiver aware of when to seek medical attention with any acute symptoms that maybe encountered. ? ?Subjective: ?Ashley Hill is an 87 y.o. year old female who is a primary patient of Sparks, Leonie Douglas, MD. The care management team was consulted for assistance with care management and/or care coordination needs.   ? ?Telephonic RN Care Manager completed Telephone Visit today. ? ?Objective:  ? ?Medications Reviewed Today   ? ? Reviewed by Arby Barrette, CPhT (Pharmacy Technician) on 12/16/20 at 1726  Med List Status: Complete  ? ?Medication Order Taking? Sig Documenting Provider Last Dose Status Informant  ?acetaminophen (TYLENOL) 500 MG tablet [id number] No Take 500 mg by mouth every 6 (six) hours as needed for moderate pain or headache. [provider] unknown prn Active Child  ?carvedilol (COREG) 25 MG tablet [id number] Yes Take 25 mg by mouth 2 (two) times daily with a meal. [provider] 12/16/2020 0900 Active Child  ?cloNIDine (CATAPRES) 0.1 MG tablet [id number] Yes Take 0.1 mg by mouth 2 (two) times daily. [provider] 12/16/2020 0900 Active Child  ?Cyanocobalamin (B-12 COMPLIANCE INJECTION) 1000 MCG/ML KIT [id number]  Inject 1,000 mcg as directed every 30 (thirty) days. [provider]  Active Child  ?diltiazem (CARDIZEM CD) 240 MG 24 hr capsule [id number] Yes Take 240 mg by mouth daily. [provider] 12/16/2020 0900 Active Child  ?donepezil (ARICEPT) 10 MG tablet [id number] Yes Take  10 mg by mouth at bedtime. [provider] 12/16/2020 0900 Active Child  ?         ?Med Note Derrek Gu   Fri Apr 30, 2020  3:51 PM) Patient's daughter doesn't think patient is still taking.  ?ELIQUIS 5 MG TABS tablet [id number] Yes Take 1 tablet by mouth twice a day  ?Patient taking differently: Take 5 mg by mouth 2 (two) times daily.  ? Kris Hartmann, NP 12/16/2020 0900 Active Child  ?         ?Med Note Arby Barrette   Fri Apr 30, 2020  3:17 PM)    ?furosemide (LASIX) 40 MG tablet [id number] Yes Take 1 tablet (40 mg total) by mouth daily. Wyvonnia Dusky, MD 12/16/2020 0900 Active Child  ?potassium chloride (KLOR-CON) 10 MEQ tablet [id number] Yes Take 2 tablets (20 mEq total) by mouth daily. Wyvonnia Dusky, MD 12/16/2020 0900 Active Child  ?         ?Med Note Arby Barrette   Thu Dec 16, 2020  5:26 PM) Patient takes 10 mEq twice daily  ?rosuvastatin (CRESTOR) 10 MG tablet [id number] Yes Take 10 mg by mouth daily. [provider] 12/16/2020 0900 Active Child  ? ?  ?  ? ?  ? ? ? ?SDOH:  (Social Determinants of Health) assessments and interventions performed:  ? ? ? ?Care Plan ? ?Review of patient past medical history, allergies, medications, health status, including review of consultants reports, laboratory and other test data, was performed as part of comprehensive evaluation for care management services.  ? ?  Care Plan : RN Care Manger Plan of Care  ?Updates made by Tobi Bastos, RN since 08/25/2021 12:00 AM  ?  ? ?Problem: Knowledge Deficit related to CHF management and care coordination needs   ?Priority: High  ?  ? ?Long-Range Goal: Establish plan of care for managing complex care coordination needs in patient with CHF   ?Start Date: 06/27/2021  ?Expected End Date: 01/12/2022  ?This Visit's Progress: On track  ?Recent Progress: On track  ?Priority: High  ?Note:   ?Current Barriers:  ?Knowledge Deficits related to plan of care for management of CHF  ? ?RNCM Clinical Goal(s):   ?Patient will verbalize understanding of plan for management of CHF as evidenced by reports received from the primary caregiver daughter Cecille Rubin  through collaboration with Consulting civil engineer, provider, and care team.  ? ?Interventions: ?Inter-disciplinary care team collaboration (see longitudinal plan of care) ?Evaluation of current treatment plan related to  self management and patient's adherence to plan as established by provider ? ? ?Heart Failure Interventions:  (Status:  Goal on track:  Yes.) Long Term Goal ?Basic overview and discussion of pathophysiology of Heart Failure reviewed ?Provided education on low sodium diet ?Reviewed Heart Failure Action Plan in depth and provided written copy ?Assessed need for readable accurate scales in home ?Provided education about placing scale on hard, flat surface ?Advised patient to weigh each morning after emptying bladder ?Discussed importance of daily weight and advised patient to weigh and record daily ?Reviewed role of diuretics in prevention of fluid overload and management of heart failure; ?Discussed the importance of keeping all appointments with provider ?Screening for signs and symptoms of depression related to chronic disease state  ?Assessed social determinant of health barriers  ? ?06/27/2021 Update: Daughter reports she has obtain 2 hired two caregiver along with rotation with family members to address pt's ongoing care throughout the day. Pt adjust fairly well with no missed medication and continue to attend all medical appointments with no delays. Daughter has also requested from the provider a social worker  Engineer, maintenance (IT)) who has visited the home with suggestive resources [facility] who introduced Respite care. Daughter has pursued further and obtained 15 hrs if needed along with the 11 hours covered by private hired throughout the week again along with family. Reports pt continues to maintain her baseline weight with no acute or precipitating symptoms of  HF since the last conversation last month.  Other topics discussed along with reiteration on the current plan of care related to H. Pt continue to be adherent to all discussed with family assistance. ? ?07/25/2021 Update: Spoke with daughter Cecille Rubin) who reports pt having some swelling 2 weeks ago and she intervened with closer monitoring, elevating her legs and pt dropped to her baseline weight in a few days with no additional issues. Caregiver has permission to administer extra medication (Lasix) but the issue resolved and pt did not require additional medication. Pt is otherwise doing well with private hired caregivers in the home daily. Pt currently in the GREEN zone and continue to follow the plan of care discussed today with no other needs. Will continue to encouraged ongoing management of care related to HF with elevating her legs and early interventions to prevent hospitalization or ED visits. Addressed all inquires and requested to follow up next month for ongoing management of care. ? ?08/25/2021 Update: POA daughter Cecille Rubin reports pt continue to do well with no acute needs today. States pt now has two caregivers in the  home daily for weekend and weekday for 5-9 hrs as needed. Pt remains at her baseline weights with no fluid retention or reported issues/needs. No incidences or issues since the last conversation as pt continues to be adherence to the plan of care with the involved caregivers. ? ?Patient Goals/Self-Care Activities: ?Take all medications as prescribed ?Attend all scheduled provider appointments ?Call pharmacy for medication refills 3-7 days in advance of running out of medications ?Attend church or other social activities ?Perform all self care activities independently  ?Perform IADL's (shopping, preparing meals, housekeeping, managing finances) independently ?Call provider office for new concerns or questions  ?call office if I gain more than 2 pounds in one day or 5 pounds in one week ?do ankle  pumps when sitting ?keep legs up while sitting ?meet with dietitian ?track weight in diary ?use salt in moderation ?watch for swelling in feet, ankles and legs every day ?weigh myself daily ?begin a h

## 2021-08-31 ENCOUNTER — Encounter (INDEPENDENT_AMBULATORY_CARE_PROVIDER_SITE_OTHER): Payer: Self-pay | Admitting: *Deleted

## 2021-09-23 ENCOUNTER — Other Ambulatory Visit: Payer: Self-pay | Admitting: *Deleted

## 2021-09-23 NOTE — Patient Outreach (Signed)
[facility]) Care Management ? ?09/23/2021 ? ?Ashley Hill ?Dec 20, 1934 ?[id number] ? ? ?Telephone Assessment-Unsuccessful ? ?Rn attempted outreach call today (daughter [name]) however unsuccessful. RN able to leave a HIPAA approved voice message requesting a call back.  ? ?Will rescheduled another outreach over the next week for ongoing [facility] services. ? ?[name], RN ?Care Management Coordinator ?[facility] ?Main Office [phone]  ?

## 2021-09-30 ENCOUNTER — Other Ambulatory Visit: Payer: Self-pay | Admitting: *Deleted

## 2021-09-30 NOTE — Patient Outreach (Signed)
Ashley Hill) Care Management  09/30/2021  Ashley Hill 10-11-1934 [id number]   Telephone Assessment-unsuccessful #2  RN attempted outreach call however unsuccessful. RN able to leave a HIPAA approved voice message requesting a call back.  Will send outreach letter and attempt another outreach call over the next week for pending services.  Raina Mina, RN Care Management Coordinator [place] Office [phone]

## 2021-10-05 ENCOUNTER — Other Ambulatory Visit: Payer: Self-pay | Admitting: *Deleted

## 2021-10-05 NOTE — Patient Outreach (Signed)
[place] [facility]) Care Management Telephonic RN Care Manager Note   10/05/2021 Name:  Ashley Hill MRN:  [id number] DOB:  Dec 30, 1934  Summary: Outreach attempt #3, successful to daughter Ashley Hill.  Denies any urgent concerns, encouraged to contact this care manager with questions.     Subjective: Ashley Hill is an 87 y.o. year old female who is a primary patient of Sparks, Leonie Douglas, MD. The care management team was consulted for assistance with care management and/or care coordination needs.    Telephonic RN Care Manager completed Telephone Visit today.  Objective:   Medications Reviewed Today     Reviewed by Arby Barrette, CPhT (Pharmacy Technician) on 12/16/20 at 1726  Med List Status: Complete   Medication Order Taking? Sig Documenting Provider Last Dose Status Informant  acetaminophen (TYLENOL) 500 MG tablet [id number] No Take 500 mg by mouth every 6 (six) hours as needed for moderate pain or headache. [provider] unknown prn Active Child  carvedilol (COREG) 25 MG tablet [id number] Yes Take 25 mg by mouth 2 (two) times daily with a meal. [provider] 12/16/2020 0900 Active Child  cloNIDine (CATAPRES) 0.1 MG tablet [id number] Yes Take 0.1 mg by mouth 2 (two) times daily. [provider] 12/16/2020 0900 Active Child  Cyanocobalamin (B-12 COMPLIANCE INJECTION) 1000 MCG/ML KIT [id number]  Inject 1,000 mcg as directed every 30 (thirty) days. [provider]  Active Child  diltiazem (CARDIZEM CD) 240 MG 24 hr capsule [id number] Yes Take 240 mg by mouth daily. [provider] 12/16/2020 0900 Active Child  donepezil (ARICEPT) 10 MG tablet [id number] Yes Take 10 mg by mouth at bedtime. [provider] 12/16/2020 0900 Active Child           Med Note Derrek Gu   Fri Apr 30, 2020  3:51 PM) Patient's daughter doesn't think patient is still taking.  ELIQUIS 5 MG TABS tablet [id number] Yes Take 1 tablet by mouth twice a day   Patient taking differently: Take 5 mg by mouth 2 (two) times daily.   Kris Hartmann, NP 12/16/2020 0900 Active Child           Med Note Arby Barrette   Fri Apr 30, 2020  3:17 PM)    furosemide (LASIX) 40 MG tablet [id number] Yes Take 1 tablet (40 mg total) by mouth daily. Wyvonnia Dusky, MD 12/16/2020 0900 Active Child  potassium chloride (KLOR-CON) 10 MEQ tablet [id number] Yes Take 2 tablets (20 mEq total) by mouth daily. Wyvonnia Dusky, MD 12/16/2020 0900 Active Child           Med Note Arby Barrette   Thu Dec 16, 2020  5:26 PM) Patient takes 10 mEq twice daily  rosuvastatin (CRESTOR) 10 MG tablet [id number] Yes Take 10 mg by mouth daily. [provider] 12/16/2020 0900 Active Child             SDOH:  (Social Determinants of Health) assessments and interventions performed:     Care Plan  Review of patient past medical history, allergies, medications, health status, including review of consultants reports, laboratory and other test data, was performed as part of comprehensive evaluation for care management services.   Care Plan : RN Care Manger Plan of Care  Updates made by Valente David, RN since 10/05/2021 12:00 AM     Problem: Knowledge Deficit related to CHF management and care coordination needs   Priority: High     Long-Range Goal: Establish plan  of care for managing complex care coordination needs in patient with CHF   Start Date: 06/27/2021  Expected End Date: 01/12/2022  This Visit's Progress: On track  Recent Progress: On track  Priority: High  Note:   Current Barriers:  Knowledge Deficits related to plan of care for management of CHF   RNCM Clinical Goal(s):  Patient will verbalize understanding of plan for management of CHF as evidenced by reports received from the primary caregiver daughter Ashley Hill  through collaboration with Consulting civil engineer, provider, and care team.   Interventions: Inter-disciplinary care team collaboration (see  longitudinal plan of care) Evaluation of current treatment plan related to  self management and patient's adherence to plan as established by provider   Heart Failure Interventions:  (Status:  Goal on track:  Yes.) Long Term Goal Basic overview and discussion of pathophysiology of Heart Failure reviewed Provided education on low sodium diet Reviewed Heart Failure Action Plan in depth and provided written copy Assessed need for readable accurate scales in home Provided education about placing scale on hard, flat surface Advised patient to weigh each morning after emptying bladder Discussed importance of daily weight and advised patient to weigh and record daily Reviewed role of diuretics in prevention of fluid overload and management of heart failure; Discussed the importance of keeping all appointments with provider Screening for signs and symptoms of depression related to chronic disease state  Assessed social determinant of health barriers   06/27/2021 Update: Daughter reports she has obtain 2 hired two caregiver along with rotation with family members to address pt's ongoing care throughout the day. Pt adjust fairly well with no missed medication and continue to attend all medical appointments with no delays. Daughter has also requested from the provider a social worker  Engineer, maintenance (IT)) who has visited the home with suggestive resources [place] who introduced Respite care. Daughter has pursued further and obtained 15 hrs if needed along with the 11 hours covered by private hired throughout the week again along with family. Reports pt continues to maintain her baseline weight with no acute or precipitating symptoms of HF since the last conversation last month.  Other topics discussed along with reiteration on the current plan of care related to H. Pt continue to be adherent to all discussed with family assistance.  07/25/2021 Update: Spoke with daughter Ashley Hill) who reports pt having some  swelling 2 weeks ago and she intervened with closer monitoring, elevating her legs and pt dropped to her baseline weight in a few days with no additional issues. Caregiver has permission to administer extra medication (Lasix) but the issue resolved and pt did not require additional medication. Pt is otherwise doing well with private hired caregivers in the home daily. Pt currently in the GREEN zone and continue to follow the plan of care discussed today with no other needs. Will continue to encouraged ongoing management of care related to HF with elevating her legs and early interventions to prevent hospitalization or ED visits. Addressed all inquires and requested to follow up next month for ongoing management of care.  08/25/2021 Update: POA daughter Ashley Hill reports pt continue to do well with no acute needs today. States pt now has two caregivers in the home daily for weekend and weekday for 5-9 hrs as needed. Pt remains at her baseline weights with no fluid retention or reported issues/needs. No incidences or issues since the last conversation as pt continues to be adherence to the plan of care with the involved caregivers.  10/05/2021 Update:  Spoke with Ashley Hill, state member is "great."  She has been staying with other daughter in [place], [place] this month.  Has not had any reported shortness of breath or chest discomfort.  They are actively looking into placement at ALF in [facility] near other daughter.  Working on having member placed by July.  They are choosing [place] rather the this area due to cost (less expensive in [facility]).  Will follow up with PCP, daughter working on establishing new provider prior to relocation.    Patient Goals/Self-Care Activities: Take all medications as prescribed Attend all scheduled provider appointments Call pharmacy for medication refills 3-7 days in advance of running out of medications Attend church or other social activities Perform all self care activities independently  Perform  IADL's (shopping, preparing meals, housekeeping, managing finances) independently Call provider office for new concerns or questions  call office if I gain more than 2 pounds in one day or 5 pounds in one week do ankle pumps when sitting keep legs up while sitting meet with dietitian track weight in diary use salt in moderation watch for swelling in feet, ankles and legs every day weigh myself daily begin a heart failure diary bring diary to all appointments develop a rescue plan follow rescue plan if symptoms flare-up eat more whole grains, fruits and vegetables, lean meats and healthy fats know when to call the doctor:based upon the provided education related to the RED or related symptoms that become acute  track symptoms and what helps feel better or worse dress right for the weather, hot or cold  Follow Up Plan:  Telephone follow up appointment with care management team member scheduled for:  May 2023        Plan:  Telephone follow up appointment with care management team member scheduled for:  1 month (June) The patient has been provided with contact information for the care management team and has been advised to call with any health related questions or concerns.   Valente David, RN, MSN, [place] Manager [phone]

## 2021-11-04 ENCOUNTER — Other Ambulatory Visit: Payer: Self-pay | Admitting: *Deleted

## 2021-11-12 DEATH — deceased

## 2022-08-22 ENCOUNTER — Encounter (INDEPENDENT_AMBULATORY_CARE_PROVIDER_SITE_OTHER): Payer: Medicare Other

## 2022-08-22 ENCOUNTER — Ambulatory Visit (INDEPENDENT_AMBULATORY_CARE_PROVIDER_SITE_OTHER): Payer: Medicare Other | Admitting: Nurse Practitioner

## 2022-08-22 ENCOUNTER — Encounter (INDEPENDENT_AMBULATORY_CARE_PROVIDER_SITE_OTHER): Payer: Self-pay
